# Patient Record
Sex: Female | Born: 1985 | Race: Black or African American | Hispanic: No | Marital: Single | Smoking: Former smoker
Health system: Southern US, Community
[De-identification: ages and names within clinical notes are randomized; demographics above are authoritative.]

## PROBLEM LIST (undated history)

## (undated) DIAGNOSIS — G8929 Other chronic pain: Secondary | ICD-10-CM

## (undated) DIAGNOSIS — F419 Anxiety disorder, unspecified: Secondary | ICD-10-CM

## (undated) DIAGNOSIS — I10 Essential (primary) hypertension: Secondary | ICD-10-CM

## (undated) DIAGNOSIS — M549 Dorsalgia, unspecified: Secondary | ICD-10-CM

## (undated) DIAGNOSIS — K219 Gastro-esophageal reflux disease without esophagitis: Secondary | ICD-10-CM

## (undated) DIAGNOSIS — F329 Major depressive disorder, single episode, unspecified: Secondary | ICD-10-CM

## (undated) DIAGNOSIS — R87619 Unspecified abnormal cytological findings in specimens from cervix uteri: Secondary | ICD-10-CM

## (undated) DIAGNOSIS — F32A Depression, unspecified: Secondary | ICD-10-CM

## (undated) HISTORY — PX: TONSILLECTOMY: SUR1361

## (undated) HISTORY — DX: Unspecified abnormal cytological findings in specimens from cervix uteri: R87.619

## (undated) HISTORY — PX: TUBAL LIGATION: SHX77

---
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
## 2006-02-05 ENCOUNTER — Emergency Department (HOSPITAL_COMMUNITY): Admission: EM | Admit: 2006-02-05 | Discharge: 2006-02-05 | Payer: Self-pay | Admitting: Emergency Medicine

## 2006-05-19 ENCOUNTER — Emergency Department (HOSPITAL_COMMUNITY): Admission: EM | Admit: 2006-05-19 | Discharge: 2006-05-19 | Payer: Self-pay | Admitting: Neurology

## 2006-07-05 ENCOUNTER — Emergency Department (HOSPITAL_COMMUNITY): Admission: EM | Admit: 2006-07-05 | Discharge: 2006-07-05 | Payer: Self-pay | Admitting: Emergency Medicine

## 2006-11-24 ENCOUNTER — Emergency Department (HOSPITAL_COMMUNITY): Admission: EM | Admit: 2006-11-24 | Discharge: 2006-11-24 | Payer: Self-pay | Admitting: Emergency Medicine

## 2007-02-13 ENCOUNTER — Emergency Department (HOSPITAL_COMMUNITY): Admission: EM | Admit: 2007-02-13 | Discharge: 2007-02-13 | Payer: Self-pay | Admitting: *Deleted

## 2007-04-21 ENCOUNTER — Emergency Department (HOSPITAL_COMMUNITY): Admission: EM | Admit: 2007-04-21 | Discharge: 2007-04-21 | Payer: Self-pay | Admitting: Emergency Medicine

## 2007-06-30 ENCOUNTER — Emergency Department (HOSPITAL_COMMUNITY): Admission: EM | Admit: 2007-06-30 | Discharge: 2007-06-30 | Payer: Self-pay | Admitting: Emergency Medicine

## 2007-07-14 ENCOUNTER — Emergency Department (HOSPITAL_COMMUNITY): Admission: EM | Admit: 2007-07-14 | Discharge: 2007-07-14 | Payer: Self-pay | Admitting: Emergency Medicine

## 2007-11-19 ENCOUNTER — Emergency Department (HOSPITAL_COMMUNITY): Admission: EM | Admit: 2007-11-19 | Discharge: 2007-11-19 | Payer: Self-pay | Admitting: Emergency Medicine

## 2008-01-21 ENCOUNTER — Emergency Department (HOSPITAL_COMMUNITY): Admission: EM | Admit: 2008-01-21 | Discharge: 2008-01-21 | Payer: Self-pay | Admitting: Emergency Medicine

## 2008-05-29 ENCOUNTER — Emergency Department (HOSPITAL_COMMUNITY): Admission: EM | Admit: 2008-05-29 | Discharge: 2008-05-29 | Payer: Self-pay | Admitting: Emergency Medicine

## 2008-09-15 ENCOUNTER — Ambulatory Visit (HOSPITAL_COMMUNITY): Admission: RE | Admit: 2008-09-15 | Discharge: 2008-09-15 | Payer: Self-pay | Admitting: Orthopedic Surgery

## 2008-12-23 ENCOUNTER — Emergency Department (HOSPITAL_COMMUNITY): Admission: EM | Admit: 2008-12-23 | Discharge: 2008-12-23 | Payer: Self-pay | Admitting: Emergency Medicine

## 2009-02-23 ENCOUNTER — Ambulatory Visit (HOSPITAL_COMMUNITY): Admission: RE | Admit: 2009-02-23 | Discharge: 2009-02-23 | Payer: Self-pay | Admitting: Pediatrics

## 2009-03-19 ENCOUNTER — Emergency Department (HOSPITAL_COMMUNITY): Admission: EM | Admit: 2009-03-19 | Discharge: 2009-03-19 | Payer: Self-pay | Admitting: Family Medicine

## 2009-06-16 ENCOUNTER — Emergency Department (HOSPITAL_COMMUNITY): Admission: EM | Admit: 2009-06-16 | Discharge: 2009-06-16 | Payer: Self-pay | Admitting: Emergency Medicine

## 2009-09-22 ENCOUNTER — Emergency Department (HOSPITAL_COMMUNITY): Admission: EM | Admit: 2009-09-22 | Discharge: 2009-09-22 | Payer: Self-pay | Admitting: Emergency Medicine

## 2010-03-02 ENCOUNTER — Emergency Department (HOSPITAL_COMMUNITY): Admission: EM | Admit: 2010-03-02 | Discharge: 2010-03-03 | Payer: Self-pay | Admitting: Emergency Medicine

## 2010-04-21 ENCOUNTER — Emergency Department (HOSPITAL_COMMUNITY): Admission: EM | Admit: 2010-04-21 | Discharge: 2010-04-21 | Payer: Self-pay | Admitting: Emergency Medicine

## 2010-07-13 ENCOUNTER — Emergency Department (HOSPITAL_COMMUNITY): Admission: EM | Admit: 2010-07-13 | Discharge: 2010-07-13 | Payer: Self-pay | Admitting: Emergency Medicine

## 2010-09-15 LAB — CYTOLOGY - PAP: Pap Smear: NEGATIVE

## 2010-09-16 ENCOUNTER — Emergency Department (HOSPITAL_COMMUNITY)
Admission: EM | Admit: 2010-09-16 | Discharge: 2010-09-16 | Payer: Self-pay | Source: Home / Self Care | Admitting: Emergency Medicine

## 2010-10-27 ENCOUNTER — Other Ambulatory Visit: Payer: Self-pay | Admitting: Obstetrics & Gynecology

## 2010-10-27 DIAGNOSIS — Q899 Congenital malformation, unspecified: Secondary | ICD-10-CM

## 2010-11-03 ENCOUNTER — Ambulatory Visit (HOSPITAL_COMMUNITY)
Admission: RE | Admit: 2010-11-03 | Discharge: 2010-11-03 | Disposition: A | Discharge status: Home / Self Care | Payer: Medicaid Other | Source: Ambulatory Visit | Attending: Obstetrics & Gynecology | Admitting: Obstetrics & Gynecology

## 2010-11-03 ENCOUNTER — Other Ambulatory Visit (HOSPITAL_COMMUNITY): Payer: Self-pay | Admitting: Maternal and Fetal Medicine

## 2010-11-03 DIAGNOSIS — O358XX0 Maternal care for other (suspected) fetal abnormality and damage, not applicable or unspecified: Secondary | ICD-10-CM

## 2010-11-03 DIAGNOSIS — Q899 Congenital malformation, unspecified: Secondary | ICD-10-CM

## 2010-11-03 DIAGNOSIS — Z1389 Encounter for screening for other disorder: Secondary | ICD-10-CM | POA: Insufficient documentation

## 2010-11-03 DIAGNOSIS — Z363 Encounter for antenatal screening for malformations: Secondary | ICD-10-CM | POA: Insufficient documentation

## 2010-12-06 ENCOUNTER — Ambulatory Visit (HOSPITAL_COMMUNITY)
Admission: RE | Admit: 2010-12-06 | Discharge: 2010-12-06 | Disposition: A | Discharge status: Home / Self Care | Payer: Medicaid Other | Source: Ambulatory Visit | Attending: Obstetrics & Gynecology | Admitting: Obstetrics & Gynecology

## 2010-12-06 ENCOUNTER — Ambulatory Visit (HOSPITAL_COMMUNITY): Payer: Medicaid Other

## 2010-12-06 DIAGNOSIS — O358XX0 Maternal care for other (suspected) fetal abnormality and damage, not applicable or unspecified: Secondary | ICD-10-CM | POA: Insufficient documentation

## 2010-12-06 DIAGNOSIS — Z3689 Encounter for other specified antenatal screening: Secondary | ICD-10-CM | POA: Insufficient documentation

## 2010-12-12 LAB — URINALYSIS, ROUTINE W REFLEX MICROSCOPIC
Protein, ur: NEGATIVE mg/dL
pH: 6 (ref 5.0–8.0)

## 2010-12-12 LAB — GC/CHLAMYDIA PROBE AMP, GENITAL: Chlamydia, DNA Probe: NEGATIVE

## 2010-12-12 LAB — WET PREP, GENITAL: Trich, Wet Prep: NONE SEEN

## 2010-12-12 LAB — URINE MICROSCOPIC-ADD ON

## 2010-12-14 LAB — URINE MICROSCOPIC-ADD ON

## 2010-12-14 LAB — URINALYSIS, ROUTINE W REFLEX MICROSCOPIC
Bilirubin Urine: NEGATIVE
Hgb urine dipstick: NEGATIVE
Ketones, ur: NEGATIVE mg/dL
Nitrite: POSITIVE — AB
Protein, ur: NEGATIVE mg/dL
Specific Gravity, Urine: 1.025 (ref 1.005–1.030)
Urobilinogen, UA: 1 mg/dL (ref 0.0–1.0)
pH: 6.5 (ref 5.0–8.0)

## 2010-12-14 LAB — CBC
Hemoglobin: 14 g/dL (ref 12.0–15.0)
MCH: 30.9 pg (ref 26.0–34.0)
MCV: 90.2 fL (ref 78.0–100.0)

## 2010-12-14 LAB — BASIC METABOLIC PANEL
BUN: 6 mg/dL (ref 6–23)
Calcium: 9.1 mg/dL (ref 8.4–10.5)
Chloride: 101 mEq/L (ref 96–112)
GFR calc non Af Amer: >60 mL/min (ref >60)
Glucose, Bld: 78 mg/dL (ref 70–99)
Potassium: 3.8 mEq/L (ref 3.5–5.1)

## 2010-12-14 LAB — DIFFERENTIAL
Basophils Relative: 0 % (ref 0–1)
Eosinophils Relative: 0 % (ref 0–5)
Lymphs Abs: 2.7 10*3/uL (ref 0.7–4.0)
Monocytes Absolute: 0.8 10*3/uL (ref 0.1–1.0)
Neutro Abs: 7.3 10*3/uL (ref 1.7–7.7)

## 2010-12-14 LAB — HCG, QUANTITATIVE, PREGNANCY: hCG, Beta Chain, Quant, S: 89772 m[IU]/mL — ABNORMAL HIGH (ref <5)

## 2010-12-14 LAB — ABO/RH: ABO/RH(D): B POS

## 2010-12-14 LAB — URINE CULTURE
Colony Count: >=100000
Culture  Setup Time: 201110130156

## 2010-12-19 LAB — RAPID STREP SCREEN (MED CTR MEBANE ONLY): Streptococcus, Group A Screen (Direct): NEGATIVE

## 2011-01-06 LAB — CBC
MCHC: 35.4 g/dL (ref 30.0–36.0)
MCV: 88.3 fL (ref 78.0–100.0)
Platelets: 355 10*3/uL (ref 150–400)
RBC: 4.55 MIL/uL (ref 3.87–5.11)

## 2011-01-06 LAB — URINALYSIS, ROUTINE W REFLEX MICROSCOPIC
Nitrite: NEGATIVE
Specific Gravity, Urine: 1.02 (ref 1.005–1.030)
pH: 7 (ref 5.0–8.0)

## 2011-01-06 LAB — WET PREP, GENITAL
Trich, Wet Prep: NONE SEEN
Yeast Wet Prep HPF POC: NONE SEEN

## 2011-01-06 LAB — PREGNANCY, URINE: Preg Test, Ur: NEGATIVE

## 2011-01-06 LAB — DIFFERENTIAL
Basophils Relative: 1 % (ref 0–1)
Eosinophils Absolute: 0.1 10*3/uL (ref 0.0–0.7)
Monocytes Relative: 8 % (ref 3–12)
Neutrophils Relative %: 52 % (ref 43–77)

## 2011-01-06 LAB — BASIC METABOLIC PANEL
BUN: 12 mg/dL (ref 6–23)
CO2: 24 mEq/L (ref 19–32)
Chloride: 104 mEq/L (ref 96–112)
Creatinine, Ser: 0.75 mg/dL (ref 0.4–1.2)
Glucose, Bld: 95 mg/dL (ref 70–99)

## 2011-01-06 LAB — RAPID STREP SCREEN (MED CTR MEBANE ONLY): Streptococcus, Group A Screen (Direct): NEGATIVE

## 2011-02-17 NOTE — Op Note (Signed)
   NAME:  Laura Lopez, Laura Lopez                     ACCOUNT NO.:  [id number]   MEDICAL RECORD NO.:  [id number]                   PATIENT TYPE:  INP   LOCATION:  LDR2                                 FACILITY:  [facility]   PHYSICIAN:  Tilda Burrow, M.D.              DATE OF BIRTH:  05-Sep-1986   DATE OF PROCEDURE:  DATE OF DISCHARGE:                                 OPERATIVE REPORT   LABOR COURSE:  About an hour after amniotomy, the patient was noted to be  fully dilated with an irresistible urge to push.  After a 12-minute second  stage, she delivered a viable female infant at 0930.  Mouth and nose were  suctioned on the perineum, and the baby was born without difficulty.  Apgars  were 9 and 9, weight 7 pounds 5.6 ounces.  Pitocin, 20 units, diluted in  1000 cc of lactated Ringer's was then infused rapidly IV.  The placenta  separated spontaneously and delivered via maternal pushing effort and  controlled cord traction at 0934.  It was inspected and appeared to be  intact with a three-vessel cord.  The vagina was then inspected, and no  lacerations were found.  Estimated blood loss 350 cc.     Zenovia Jordan, P.A.                      Tilda Burrow, M.D.    RRK/MEDQ  D:  02/06/2003  T:  02/06/2003  Job:  [id number]   cc:   [facility]

## 2011-02-17 NOTE — Discharge Summary (Signed)
[facility]  Patient:    Laura Lopez, Laura Lopez Visit Number: [id number] MRN: [id number]          Service Type: MED Location: 3A A327 01 Attending Physician:  Tilda Burrow Dictated by:   Duane Lope, M.D. Admit Date:  02/10/2002 Discharge Date: 02/12/2002                             Discharge Summary  DISCHARGE DIAGNOSES: 1. Probable right pyelonephritis. 2. No evidence of renal calculus.  PROCEDURES: 1. Feb 10, 2002, admission by Zerita Boers, N.M.. 2. Feb 11, 2002, daily care by Duane Lope, M.D. 3. Feb 12, 2002, discharge care by Duane Lope, M.D.  NOTE:  Please refer to the transcribed history and physical for details of admission to the hospital.  HOSPITAL COURSE:  The patient was admitted with probable pyelonephritis. She has been on Ancef IV.  She began to feel better pretty quickly.  Her white count was 12,500 with a left shift.  Her hemoglobulin and hematocrit was fine. She had an ultrasound on Feb 12, 2002, which was normal.  She had a small ovarian cyst on the left but no renal calculus and no other abnormalities.  DISPOSITION: She was discharged to home on the evening on hospital day #3.  DISCHARGE MEDICATIONS: 1. Levaquin 500 q.d. for 10 days 2. Darvocet as needed for pain.  FOLLOWUP:  Follow up in the office in 1 week.  CONDITION:  She was maintaining p.o. well, pain was well controlled, and her CVA tenderness was just about resolved.  She was given instruction precautions for return prior to that time. Dictated by:   Duane Lope, M.D. Attending Physician:  Tilda Burrow DD:  02/12/02 TD:  02/15/02 Job: [id number]

## 2011-02-17 NOTE — Consult Note (Signed)
NAME:  Laura Lopez, ERICSSON NO.:  [id number]   MEDICAL RECORD NO.:  [id number]          PATIENT TYPE:  EMS   LOCATION:  ED                            FACILITY:  [facility]   PHYSICIAN:  Tilda Burrow, M.D. DATE OF BIRTH:  10/06/1985   DATE OF CONSULTATION:  DATE OF DISCHARGE:                                   CONSULTATION   CONSULTING PHYSICIAN:  Tilda Burrow, M.D.   CHIEF COMPLAINT:  Right lower quadrant pain, nausea, weak, vomiting,  pregnancy five weeks' gestation.   HISTORY OF PRESENT ILLNESS:  This 25 year old female, gravida 3, para 2,  with recent diagnosis of pregnancy, seen by Dr. Colon Branch in the emergency room  for evaluation of right lower quadrant pain.  She has had a recent positive  pregnancy test in our office.  She is not established prenatal care but has  an appointment for Feb 15, 2006 at our office.  Evaluation here in the  emergency room includes a quantitative HCG of 2,000 and a pelvic ultrasound  which shows an anteflexed uterus with a small fluid pocket in the uterus  without discernible fetal pole or yolk sack yet available.  Evelena Leyden is  sufficiently large to possibly represent intrauterine pregnancy.  Cul-de-sac  does not show any bleeding.  The right adnexa shows normal ovary.  The  patient feels better with pain having improved while being in observation  and at rest.  She is therefore scheduled for followup as scheduled, on Feb 15, 2006, at which time a followup ultrasound will be performed.  Any  deterioration of symptoms, the patient knows to contact our office for  advancement of her appointment.   ADDENDUM:  We will wait on prenatal _I profile labs__  until followup visit.      Tilda Burrow, M.D.  Electronically Signed     JVF/MEDQ  D:  02/05/2006  T:  02/06/2006  Job:  [id number]   cc:   Tilda Burrow, M.D.  Fax: [phone]

## 2011-02-17 NOTE — H&P (Signed)
   NAME:  Laura Lopez, Laura Lopez                     ACCOUNT NO.:  [id number]   MEDICAL RECORD NO.:  [id number]                   PATIENT TYPE:  INP   LOCATION:  LDR2                                 FACILITY:  [facility]   PHYSICIAN:  Tilda Burrow, M.D.              DATE OF BIRTH:  Dec 04, 1985   DATE OF ADMISSION:  02/06/2003  DATE OF DISCHARGE:                                HISTORY & PHYSICAL   CHIEF COMPLAINT:  Contractions beginning about 12 a.m.   HISTORY OF PRESENT ILLNESS:  Laura Lopez is a 25 year old gravida 2, para 1  with an EDC of 02/17/2003 based on sure last menstrual period and close to  correlating first and second trimester ultrasounds placing her at 38-1/[redacted]  weeks gestational age. Prenatal care began early in the first trimester and  she has had regular visits throughout.   PRENATAL LABS:  Blood type B positive, rubella immune.  HBSAG, HIV, and RPR  are all negative.  She had a positive Chlamydia in the first trimester which  was treated, with a repeat negative culture.  GBS is positive.  A 1-hour GTT  was normal at 80.  MSAFP was also normal.  Sickle cell was negative.  Her  weight gain has been approximately 16 pounds with appropriate fundal height  growth.  Blood pressures 100s-120s/60s-80s.   PAST MEDICAL HISTORY:  Noncontributory.   PAST SURGICAL HISTORY:  Noncontributory. No history of blood transfusions.   SOCIAL HISTORY:  She is single.  Denies smoking, alcohol, or drug use.   FAMILY HISTORY:  Positive for muscular dystrophy on her side and 2 nephews  on the father of the baby's side.   OBSTETRICAL HISTORY:  Positive for a vaginal delivery in 2002 of a 6 pound  10 ounce female without complications.   PHYSICAL EXAMINATION:  HEENT:  Within normal limits.  HEART:  Regular rate and rhythm.  LUNGS:  Clear.  ABDOMEN:  Soft, nontender.  Was having regular, mild-to-moderate  contractions every 2-3 minutes.  PELVIC:  Cervical exam upon admission was 4-5, 100%, minus  1.  Fetal heart  rate 140s-150s with average long term variability, access present,  occasional mild variable decelerations.  Cervical exam, now, is 5-6 cm,  100%, minus 1 station, artifical rupture of membranes reveals clear fluid.  EXTREMITIES:  Legs are negative.    IMPRESSION:  Intrauterine pregnancy at 38-1/2 weeks, active labor.  Group B  Strep carrier.   PLAN:  Continue with GBS prophylaxis.  Expect vaginal delivery.  The patient  wants IV medications.     Jacklyn Shell, C.N.M.          Tilda Burrow, M.D.    FC/MEDQ  D:  02/06/2003  T:  02/06/2003  Job:  [id number]   cc:   [facility]

## 2011-02-17 NOTE — Discharge Summary (Signed)
NAME:  Laura Lopez, Laura Lopez                     ACCOUNT NO.:  [id number]   MEDICAL RECORD NO.:  [id number]                   PATIENT TYPE:  OIB   LOCATION:  A415                                 FACILITY:  [facility]   PHYSICIAN:  Langley Gauss, M.D.                DATE OF BIRTH:  01-23-1986   DATE OF ADMISSION:  11/16/2002  DATE OF DISCHARGE:                                 DISCHARGE SUMMARY   HISTORY OF PRESENT ILLNESS:  This is a 25 year old, G2, P53, at 72 and 6/7ths  weeks gestation followed by [facility] who presents to [facility] with a chief complaint of lower abdominal pain, and she felt a gush  of fluid at 1730.  The patient's history is that she was watching a movie,  and when she got up she felt very wet in her undergarments and had some  fluid running down her leg.  She thereafter went to the bathroom and voided,  emptying her bladder, and upon returning from the bathroom she again had  some moistness in her vaginal pelvic area.  She states that she continued to  feel moist and contracted labor and delivery, at which time she was advised  to present.  She again continued feeling moist in the vaginal pelvic area,  but denied any additional leakage of fluid down her leg.   Pertinently, she has denied any change in her vaginal discharge.  No recent  change as far as itching, burning, odor, or increased discharge.  She denies  any GI symptoms.  She has had good fetal movement.  The pain which she  describes is a tightening in the back and pelvic area; however, the  tightening lasted for five minutes duration and is occurring 2-3 times in  each 30 minute time period and has continued all day today.   The patient's entire prenatal records were reviewed as available on labor  and delivery.  The patient's prenatal record is pertinent for a history of  Chlamydia 07/08/02.  The patient was treated at that time, and on subsequent  follow up a test of cure was noted to be  negative for Chlamydia.  Test of  cure done 08/12/02.  The patient is noted to have a normal AFP triple  screen.  A urine drug screen is negative.  She has serial ultrasounds which  document her EDC of 02/17/03.   FAMILY HISTORY:  Pertinent for a strong family history of muscular dystrophy  on the father of the baby's side, and on the patient's side she did decline  genetic amniocentesis.  She did have genetic counseling offered, and an  appointment was scheduled for the patient.  It was unclear whether she  attended this patient counseling session.   SOCIAL HISTORY:  The patient, of course, is single.  She lives with a woman  named Mattie.  She is a nonsmoker.  No drug use.  ALLERGIES:  No known drug allergies.   CURRENT MEDICATIONS:  Prenatal vitamins only.   PAST MEDICAL HISTORY:  Otherwise negative with no other hospitalizations  other than for childbirth.   PAST OB HISTORY:  2002 at [redacted] weeks gestation, 6 pound, 10 ounce female  infant delivered by Dr. Christin Bach.  The patient reports no problems  during that prenatal course, and no problems at time of delivery.   PHYSICAL EXAMINATION:  GENERAL:  Very immature adolescent female.  The  patient is alert and oriented.  VITAL SIGNS:  Temperature 99.2, pulse 112, respiratory rate 20, blood  pressure 108/55.  HEENT:  Reveals neck to be supple.  Thyroid is nonpalpable.  Mucous  membranes are moist.  LUNGS:  Clear.  CARDIOVASCULAR:  Regular rate and rhythm.  ABDOMEN:  Soft and nontender.  Fundal height is 26 cm.  She is vertex  presentation by Leopold's maneuvers.  No uterine or abdominal tenderness  elicited.  EXTREMITIES:  Noted to be normal.  PELVIC:  Speculum examination performed.  There is noted to be no evidence  of any leakage of fluid or vaginal bleeding externally.  The cervix is  visualized and noted to be visually closed.  There is a bubbly frothy-  appearing discharge present with some pooling in the posterior  vaginal  fornix.  No odor is identified.  GC and Chlamydia cultures and a wet prep  are not performed due to the inherent difficulties of technically performing  these tests on labor and delivery; however, a dry slide was obtained, which  upon drying is negative for ferns.  A single sterile digital exam is  performed which reveals the cervix to be closed, 4 cm long.  The presenting  part is non-engaged.   A sonogram was performed and interpreted by Dr. Langley Gauss which reveals  a single intrauterine pregnancy, vertex presentation.  Good fetal movement  is identified.  Normal fetal tone.  BPD and femur length were obtained,  which are consistent with 27 to [redacted] weeks gestation.  Fetal cardiac activity  is identified.  Amniotic fluid index was performed which reveals an AFI  totaling 18.  External fetal monitor reveals a fetal heart rate baseline of  155.  There are no fetal heart rate decelerations noted.  There was no urine  activity identified on the external toco.   ASSESSMENT:  A 27 week intrauterine pregnancy with leukorrhea.  No evidence  of rupture of membranes.  The patient is noted to be high risk for sexually  transmitted diseases in this adolescent group, particular with a history of  positive Chlamydia during this pregnancy.  Thus she will require testing on  an outpatient basis with performance of a wet prep to look for any other  sexually transmitted diseases.                                               Langley Gauss, M.D.    DC/MEDQ  D:  11/16/2002  T:  11/16/2002  Job:  [id number]   cc:   [facility]

## 2011-02-24 ENCOUNTER — Inpatient Hospital Stay (HOSPITAL_COMMUNITY)
Admission: AD | Admit: 2011-02-24 | Discharge: 2011-02-26 | DRG: 775 | Disposition: A | Discharge status: Home / Self Care | Payer: Medicaid Other | Source: Ambulatory Visit | Attending: Obstetrics & Gynecology | Admitting: Obstetrics & Gynecology

## 2011-02-24 ENCOUNTER — Emergency Department (HOSPITAL_COMMUNITY)
Admission: EM | Admit: 2011-02-24 | Discharge: 2011-02-24 | Disposition: A | Discharge status: Home / Self Care | Payer: Medicaid Other | Source: Home / Self Care | Attending: Emergency Medicine | Admitting: Emergency Medicine

## 2011-02-24 LAB — DIFFERENTIAL
Basophils Absolute: 0 10*3/uL (ref 0.0–0.1)
Basophils Relative: 0 % (ref 0–1)
Eosinophils Relative: 1 % (ref 0–5)
Monocytes Absolute: 0.7 10*3/uL (ref 0.1–1.0)
Neutro Abs: 4.3 10*3/uL (ref 1.7–7.7)

## 2011-02-24 LAB — BASIC METABOLIC PANEL
Calcium: 9.9 mg/dL (ref 8.4–10.5)
GFR calc Af Amer: >60 mL/min (ref >60)
GFR calc non Af Amer: >60 mL/min (ref >60)
Sodium: 137 mEq/L (ref 135–145)

## 2011-02-24 LAB — CBC
Hemoglobin: 12.5 g/dL (ref 12.0–15.0)
Hemoglobin: 12.1 g/dL (ref 12.0–15.0)
MCHC: 34.6 g/dL (ref 30.0–36.0)
Platelets: 280 10*3/uL (ref 150–400)
RBC: 4.11 MIL/uL (ref 3.87–5.11)
RDW: 13.6 % (ref 11.5–15.5)
WBC: 10.7 10*3/uL — ABNORMAL HIGH (ref 4.0–10.5)

## 2011-02-25 LAB — RPR: RPR Ser Ql: NONREACTIVE

## 2011-02-26 LAB — CBC
HCT: 31.5 % — ABNORMAL LOW (ref 36.0–46.0)
Hemoglobin: 10.9 g/dL — ABNORMAL LOW (ref 12.0–15.0)
WBC: 10.7 10*3/uL — ABNORMAL HIGH (ref 4.0–10.5)

## 2011-02-28 NOTE — Op Note (Signed)
  NAMETEMPEST, FRANKLAND           ACCOUNT NO.:  [id number]  MEDICAL RECORD NO.:  [id number]           PATIENT TYPE:  I  LOCATION:  9143                          FACILITY:  [facility]  PHYSICIAN:  Tilda Burrow, M.D. DATE OF BIRTH:  1986-09-10  DATE OF PROCEDURE: DATE OF DISCHARGE:                              OPERATIVE REPORT   PREOPERATIVE DIAGNOSIS:  Desire for postpartum permanent sterilization.  POSTOPERATIVE DIAGNOSIS:  Desire for postpartum permanent sterilization.  PROCEDURE:  Postpartum sterilization by Filshie clips.  SURGEON:  Tilda Burrow, MD  ASSISTANT:  Selena Batten.  ANESTHESIA:  Epidural.  COMPLICATIONS:  None.  FINDINGS:  Normal tubes bilaterally, visualized fimbriated end.  INDICATIONS:  A 25 year old female, multipara delivering yesterday desiring permanent sterilization.  DETAILS OF PROCEDURE:  The patient was taken to the operating room. After consents obtained and confirmed by all involved parties, time-out completed.  Infraumbilical semicircular 2-cm incision was made with sharp dissection down to the peritoneum, which was bluntly entered.  The attention was directed first to the right tube, which was identified to its fimbriated end with a Filshie clip placed in the midportion of the tube with good placement confirmed visually.  The opposite tube was treated similarly.  The fascial layer was closed with running 0 Vicryl. The subcuticular 4-0 Vicryl closure of the skin completed the procedure. The patient tolerated procedure well, went to recovery room in good condition.  Sponge and needle counts correct.     Tilda Burrow, M.D.     JVF/MEDQ  D:  02/25/2011  T:  02/25/2011  Job:  [id number]  Electronically Signed by Christin Bach M.D. on 02/28/2011 01:47:09 AM

## 2011-06-23 LAB — STREP A DNA PROBE

## 2011-06-23 LAB — RAPID STREP SCREEN (MED CTR MEBANE ONLY): Streptococcus, Group A Screen (Direct): NEGATIVE

## 2011-06-27 LAB — CBC
MCHC: 35.2
Platelets: 376
RBC: 4.74
WBC: 10.3

## 2011-06-27 LAB — CK: Total CK: 169

## 2011-06-27 LAB — BASIC METABOLIC PANEL
BUN: 8
CO2: 25
Calcium: 9.4
Creatinine, Ser: 0.78
GFR calc Af Amer: >60

## 2011-06-27 LAB — DIFFERENTIAL
Basophils Absolute: 0.1
Eosinophils Absolute: 0.2
Lymphs Abs: 3.7
Neutrophils Relative %: 52

## 2011-06-27 LAB — D-DIMER, QUANTITATIVE: D-Dimer, Quant: 0.32

## 2011-06-27 LAB — POCT CARDIAC MARKERS: CKMB, poc: <1 — ABNORMAL LOW

## 2011-06-27 LAB — SEDIMENTATION RATE: Sed Rate: 25 — ABNORMAL HIGH

## 2011-07-13 LAB — STREP A DNA PROBE: Group A Strep Probe: NEGATIVE

## 2011-07-17 LAB — URINE CULTURE: Colony Count: >=100000

## 2011-07-17 LAB — URINALYSIS, ROUTINE W REFLEX MICROSCOPIC
Hgb urine dipstick: NEGATIVE
Nitrite: NEGATIVE
Specific Gravity, Urine: 1.02
Urobilinogen, UA: 0.2
pH: 6

## 2011-07-17 LAB — URINE MICROSCOPIC-ADD ON

## 2011-07-28 ENCOUNTER — Emergency Department (HOSPITAL_COMMUNITY)
Admission: EM | Admit: 2011-07-28 | Discharge: 2011-07-28 | Disposition: A | Discharge status: Home / Self Care | Payer: Medicaid Other | Attending: Emergency Medicine | Admitting: Emergency Medicine

## 2011-07-28 DIAGNOSIS — J329 Chronic sinusitis, unspecified: Secondary | ICD-10-CM | POA: Insufficient documentation

## 2011-07-28 MED ORDER — PENICILLIN V POTASSIUM 500 MG PO TABS: 500.0000 mg | ORAL_TABLET | Freq: Three times a day (TID) | ORAL | Status: AC

## 2011-07-28 MED ORDER — FEXOFENADINE-PSEUDOEPHED ER 60-120 MG PO TB12: 1.0000 | ORAL_TABLET | Freq: Two times a day (BID) | ORAL | Status: DC

## 2011-07-28 MED ORDER — HYDROCODONE-ACETAMINOPHEN 5-325 MG PO TABS: ORAL_TABLET | ORAL | Status: DC

## 2011-07-28 NOTE — ED Provider Notes (Signed)
Medical screening examination/treatment/procedure(s) were performed by non-physician practitioner and as supervising physician I was immediately available for consultation/collaboration.   Joya Gaskins, MD 07/28/11 2052

## 2011-07-28 NOTE — ED Provider Notes (Signed)
History     CSN: [id number] Arrival date & time: 07/28/2011  5:26 PM   None     Chief Complaint  Patient presents with  . Otalgia    (Consider location/radiation/quality/duration/timing/severity/associated sxs/prior treatment) Patient is a 25 y.o. female presenting with ear pain. The history is provided by the patient.  Otalgia This is a new problem. The current episode started 12 to 24 hours ago. There is pain in the right ear. The problem occurs constantly. The problem has not changed since onset.There has been no fever. The pain is moderate. Pertinent negatives include no abdominal pain, no neck pain and no cough.    History reviewed. No pertinent past medical history.  Past Surgical History  Procedure Date  . Tubal ligation   . Tonsillectomy     History reviewed. No pertinent family history.  History  Substance Use Topics  . Smoking status: Never Smoker   . Smokeless tobacco: Not on file  . Alcohol Use: Yes     occ    OB History    Grav Para Term Preterm Abortions TAB SAB Ect Mult Living                  Review of Systems  Constitutional: Negative for activity change.       All ROS Neg except as noted in HPI  HENT: Positive for ear pain, congestion and postnasal drip. Negative for nosebleeds and neck pain.   Eyes: Negative for photophobia and discharge.  Respiratory: Negative for cough, shortness of breath and wheezing.   Cardiovascular: Negative for chest pain and palpitations.  Gastrointestinal: Negative for abdominal pain and blood in stool.  Genitourinary: Negative for dysuria, frequency and hematuria.  Musculoskeletal: Negative for back pain and arthralgias.  Skin: Negative.   Neurological: Negative for dizziness, seizures and speech difficulty.  Psychiatric/Behavioral: Negative for hallucinations and confusion.    Allergies  Review of patient's allergies indicates no known allergies.  Home Medications   Current Outpatient Rx  Name Route Sig  Dispense Refill  . GUAIFENESIN-DM 100-10 MG/5ML PO SYRP Oral Take 10 mLs by mouth as needed. For cough     . IBUPROFEN 200 MG PO TABS Oral Take 200 mg by mouth as needed. For pain     . FEXOFENADINE-PSEUDOEPHEDRINE 60-120 MG PO TB12 Oral Take 1 tablet by mouth every 12 (twelve) hours. 20 tablet 0  . HYDROCODONE-ACETAMINOPHEN 5-325 MG PO TABS  1 po q4h prn pain, take with food 12 tablet 0  . PENICILLIN V POTASSIUM 500 MG PO TABS Oral Take 1 tablet (500 mg total) by mouth 3 (three) times daily. 21 tablet 0    BP 136/76  Pulse 85  Temp(Src) 98.5 F (36.9 C) (Oral)  Resp 20  Ht 5\' 6"  (1.676 m)  Wt 191 lb (86.637 kg)  BMI 30.83 kg/m2  SpO2 100%  LMP 07/24/2011  Physical Exam  Nursing note and vitals reviewed. Constitutional: She is oriented to person, place, and time. She appears well-developed and well-nourished.  Non-toxic appearance.  HENT:  Head: Normocephalic.  Right Ear: Tympanic membrane normal.  Left Ear: Tympanic membrane normal.       Rt and Left ear partially obstructed with cerumen. Mild increase redness of the posterior pharynx.  Pain to percussion of the right maxillary sinuses.  Eyes: EOM and lids are normal. Pupils are equal, round, and reactive to light.  Neck: Normal range of motion. Neck supple. Carotid bruit is not present.  Cardiovascular: Normal rate, regular rhythm,  normal heart sounds, intact distal pulses and normal pulses.   Pulmonary/Chest: Breath sounds normal. No respiratory distress.  Abdominal: Soft. Bowel sounds are normal. There is no tenderness. There is no guarding.  Musculoskeletal: Normal range of motion.  Lymphadenopathy:       Head (right side): No submandibular adenopathy present.       Head (left side): No submandibular adenopathy present.    She has no cervical adenopathy.  Neurological: She is alert and oriented to person, place, and time. She has normal strength. No cranial nerve deficit or sensory deficit.  Skin: Skin is warm and dry.    Psychiatric: She has a normal mood and affect. Her speech is normal.    ED Course  Procedures (including critical care time)  Labs Reviewed - No data to display No results found.   1. Sinusitis       MDM  I have reviewed nursing notes, vital signs, and all appropriate lab and imaging results for this patient.        Kathie Dike, [place] 07/28/11 [phone]

## 2011-07-28 NOTE — ED Notes (Signed)
Pt presents with right ear pain and clear drainage that started this AM. Pt states face also is hurting. NAD at this time.

## 2011-09-14 ENCOUNTER — Emergency Department (HOSPITAL_COMMUNITY)
Admission: EM | Admit: 2011-09-14 | Discharge: 2011-09-14 | Disposition: A | Discharge status: Home / Self Care | Payer: Medicaid Other | Attending: Emergency Medicine | Admitting: Emergency Medicine

## 2011-09-14 ENCOUNTER — Encounter (HOSPITAL_COMMUNITY): Payer: Self-pay | Admitting: Emergency Medicine

## 2011-09-14 DIAGNOSIS — M549 Dorsalgia, unspecified: Secondary | ICD-10-CM | POA: Insufficient documentation

## 2011-09-14 DIAGNOSIS — R51 Headache: Secondary | ICD-10-CM | POA: Insufficient documentation

## 2011-09-14 DIAGNOSIS — R109 Unspecified abdominal pain: Secondary | ICD-10-CM | POA: Insufficient documentation

## 2011-09-14 DIAGNOSIS — N390 Urinary tract infection, site not specified: Secondary | ICD-10-CM

## 2011-09-14 DIAGNOSIS — R509 Fever, unspecified: Secondary | ICD-10-CM | POA: Insufficient documentation

## 2011-09-14 DIAGNOSIS — R300 Dysuria: Secondary | ICD-10-CM | POA: Insufficient documentation

## 2011-09-14 DIAGNOSIS — R112 Nausea with vomiting, unspecified: Secondary | ICD-10-CM | POA: Insufficient documentation

## 2011-09-14 DIAGNOSIS — R42 Dizziness and giddiness: Secondary | ICD-10-CM | POA: Insufficient documentation

## 2011-09-14 LAB — URINE MICROSCOPIC-ADD ON

## 2011-09-14 LAB — URINALYSIS, ROUTINE W REFLEX MICROSCOPIC
Bilirubin Urine: NEGATIVE
Glucose, UA: NEGATIVE mg/dL
Hgb urine dipstick: NEGATIVE
Ketones, ur: NEGATIVE mg/dL
Protein, ur: NEGATIVE mg/dL
pH: 6 (ref 5.0–8.0)

## 2011-09-14 MED ORDER — CEFTRIAXONE SODIUM 1 G IJ SOLR
1.0000 g | Freq: Once | INTRAMUSCULAR | Status: AC
  Administered 2011-09-14: 1 g via INTRAVENOUS
  Filled 2011-09-14: qty 10

## 2011-09-14 MED ORDER — SODIUM CHLORIDE 0.9 % IV BOLUS (SEPSIS)
1000.0000 mL | Freq: Once | INTRAVENOUS | Status: AC
  Administered 2011-09-14: 1000 mL via INTRAVENOUS

## 2011-09-14 MED ORDER — ONDANSETRON HCL 4 MG PO TABS: 4.0000 mg | ORAL_TABLET | Freq: Four times a day (QID) | ORAL | Status: AC

## 2011-09-14 MED ORDER — ONDANSETRON HCL 4 MG/2ML IJ SOLN
4.0000 mg | Freq: Once | INTRAMUSCULAR | Status: AC
  Administered 2011-09-14: 4 mg via INTRAVENOUS
  Filled 2011-09-14: qty 2

## 2011-09-14 MED ORDER — NITROFURANTOIN MONOHYD MACRO 100 MG PO CAPS: 100.0000 mg | ORAL_CAPSULE | Freq: Two times a day (BID) | ORAL | Status: AC

## 2011-09-14 NOTE — ED Notes (Signed)
Discharge instructions reviewed with pt, questions answered. Pt verbalized understanding.  

## 2011-09-14 NOTE — ED Notes (Signed)
No adverse reactions to medications, pt reports relief from nausea

## 2011-09-14 NOTE — ED Provider Notes (Signed)
History  Scribed for Donnetta Hutching, MD, the patient was seen in room APA16A/APA16A. This chart was scribed by Candelaria Stagers. The patient's care started at 9:19 PM    CSN: [id number] Arrival date & time: 09/14/2011  8:46 PM   First MD Initiated Contact with Patient 09/14/11 2100      Chief Complaint  Patient presents with  . Fever  . Emesis  . Urinary Tract Infection     The history is provided by the patient.   Laura Lopez is a 25 y.o. female who presents to the Emergency Department complaining of vomiting and fever which began about six hours ago.  Pt states she has experienced headache, chills, dizziness, abdominal pain, and dysuria.  Her LNMP was November 21st and reports she has had tubal ligation.      History reviewed. No pertinent past medical history.  Past Surgical History  Procedure Date  . Tubal ligation   . Tonsillectomy     No family history on file.  History  Substance Use Topics  . Smoking status: Former Games developer  . Smokeless tobacco: Not on file  . Alcohol Use: Yes     occ    OB History    Grav Para Term Preterm Abortions TAB SAB Ect Mult Living                  Review of Systems  Constitutional: Positive for fever and chills.  HENT: Negative for rhinorrhea.   Eyes: Negative for pain.  Respiratory: Negative for cough and shortness of breath.   Cardiovascular: Negative for chest pain.  Gastrointestinal: Positive for nausea, vomiting and abdominal pain. Negative for diarrhea.  Genitourinary: Positive for dysuria.  Musculoskeletal: Positive for back pain (mild ).  Skin: Negative for rash.  Neurological: Positive for dizziness and headaches. Negative for weakness.    Allergies  Review of patient's allergies indicates no known allergies.  Home Medications   Current Outpatient Rx  Name Route Sig Dispense Refill  . GUAIFENESIN-DM 100-10 MG/5ML PO SYRP Oral Take 10 mLs by mouth as needed. For cough     . IBUPROFEN 200 MG PO TABS Oral  Take 200 mg by mouth as needed. For pain     . FEXOFENADINE-PSEUDOEPHED ER 60-120 MG PO TB12 Oral Take 1 tablet by mouth every 12 (twelve) hours. 20 tablet 0  . HYDROCODONE-ACETAMINOPHEN 5-325 MG PO TABS  1 po q4h prn pain, take with food 12 tablet 0    BP 127/71  Pulse 126  Temp(Src) 100.3 F (37.9 C) (Oral)  Resp 20  Ht 5\' 6"  (1.676 m)  Wt 191 lb (86.637 kg)  BMI 30.83 kg/m2  SpO2 100%  LMP 08/23/2011  Physical Exam  Nursing note and vitals reviewed. Constitutional: She is oriented to person, place, and time. She appears well-developed and well-nourished. No distress.  HENT:  Head: Normocephalic and atraumatic.  Eyes: EOM are normal. Pupils are equal, round, and reactive to light.  Neck: Neck supple. No tracheal deviation present.  Cardiovascular: Normal rate.   Pulmonary/Chest: Effort normal. No respiratory distress.  Abdominal: Soft. She exhibits no distension. There is tenderness ( minimal lower abdominal tenderness of the midline).  Musculoskeletal: Normal range of motion. She exhibits tenderness (minimal flank tenderness of the right side). She exhibits no edema.  Neurological: She is alert and oriented to person, place, and time. No sensory deficit.  Skin: Skin is warm and dry.  Psychiatric: She has a normal mood and affect. Her behavior is normal.  ED Course  Procedures   Suspects flu and UTI, will give fluids and order urinalysis.   DIAGNOSTIC STUDIES: Oxygen Saturation is 100% on room air, normal by my interpretation.    COORDINATION OF CARE:  9:38PM Ordered: Urinalysis, Routine w reflex microscopic ; Pregnancy, urine ; ondansetron (ZOFRAN) injection 4 mg ; sodium chloride 0.9 % bolus 1,000 mL    Labs Reviewed  URINALYSIS, ROUTINE W REFLEX MICROSCOPIC - Abnormal; Notable for the following:    Leukocytes, UA SMALL (*)    All other components within normal limits  URINE MICROSCOPIC-ADD ON - Abnormal; Notable for the following:    Squamous Epithelial / LPF  MANY (*)    Bacteria, UA FEW (*)    All other components within normal limits  PREGNANCY, URINE   No results found.   No diagnosis found.    MDM  Patient complains of nausea, vomiting, dysuria.  Feeling better after IV rehydration.  Rocephin 1 g IV given for urinary tract action.  Discharge home on Macrobid and Zofran   I personally performed the services described in this documentation, which was scribed in my presence. The recorded information has been reviewed and considered.        Donnetta Hutching, MD 09/14/11 [phone]

## 2011-09-14 NOTE — ED Notes (Signed)
Patient c/o fever and vomiting since 1530; states she thinks she might have a UTI also.

## 2011-09-14 NOTE — ED Notes (Signed)
MD at bedside. 

## 2011-10-15 IMAGING — US US OB DETAIL+14 WK
1 series · 18 of 28 positions shown · non-contrast
Comparison: none

[Series 1: us ob detail+14 wk · 18 of 114 slices shown]
[im 1/114]
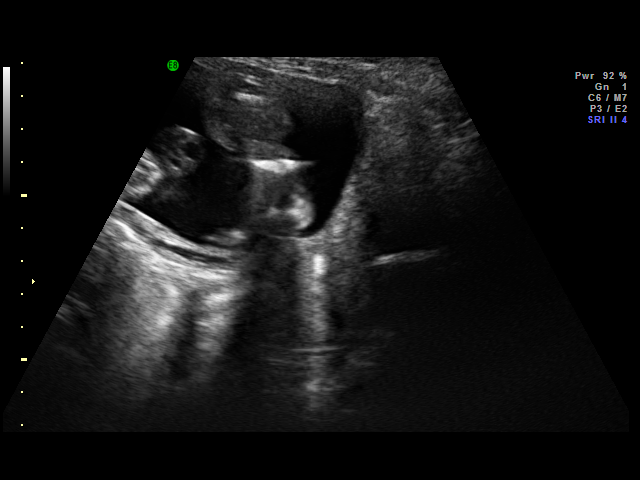
[im 9/114]
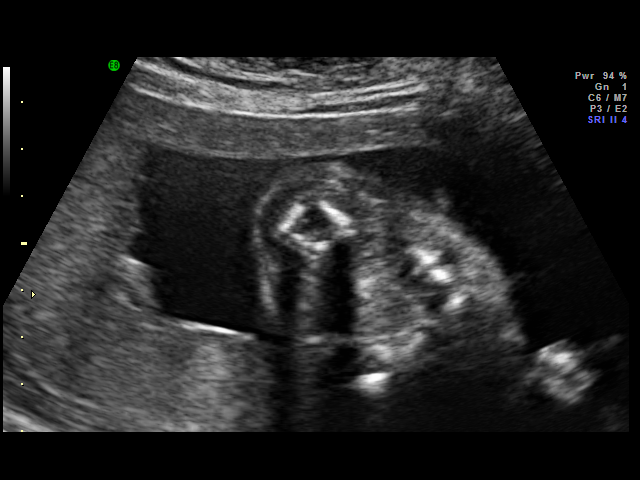
[im 13/114]
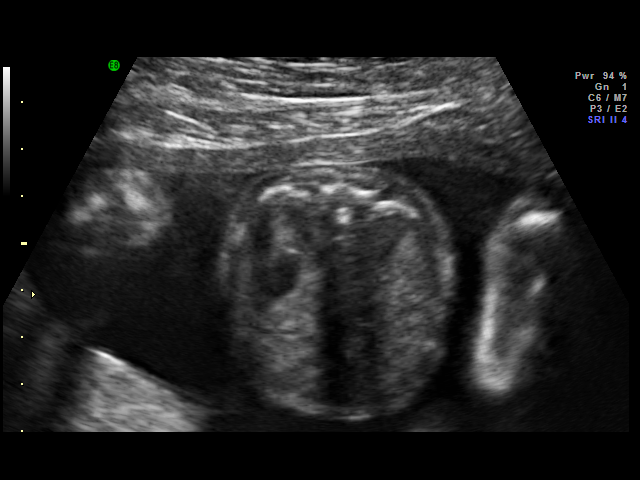
[im 21/114]
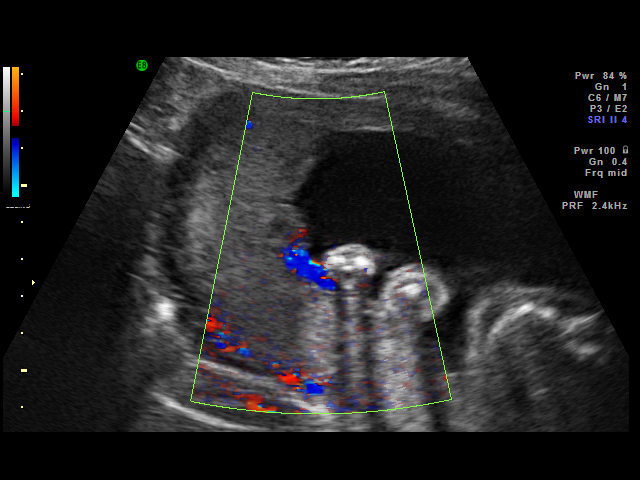
[im 30/114]
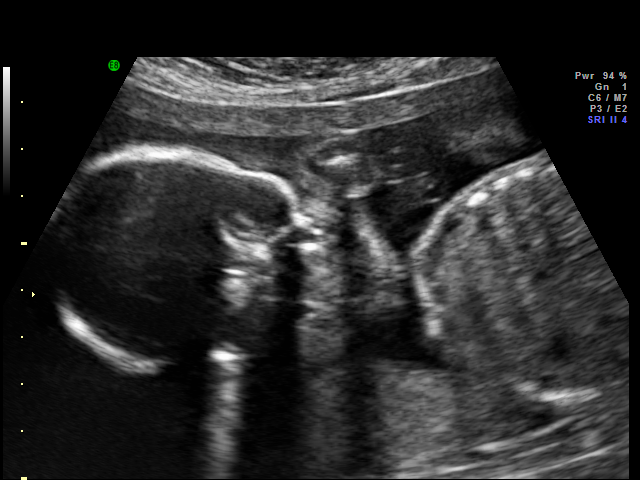
[im 34/114]
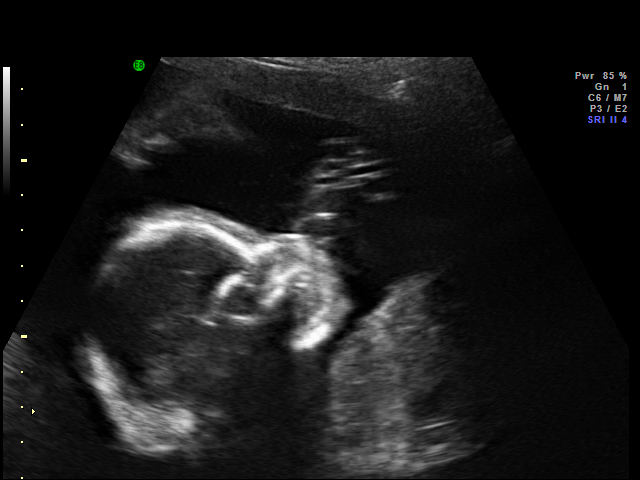
[im 42/114]
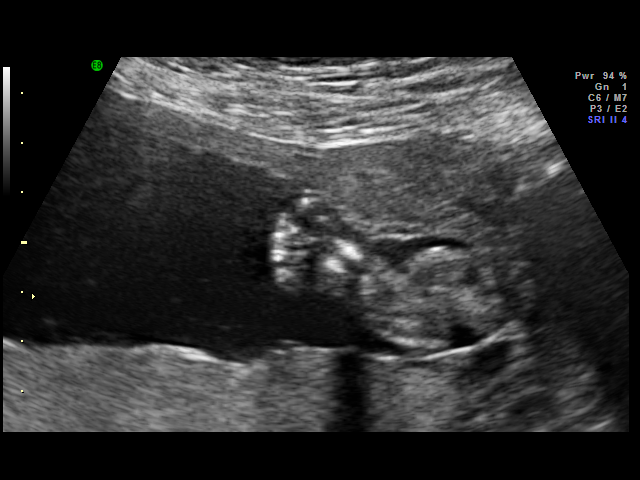
[im 47/114]
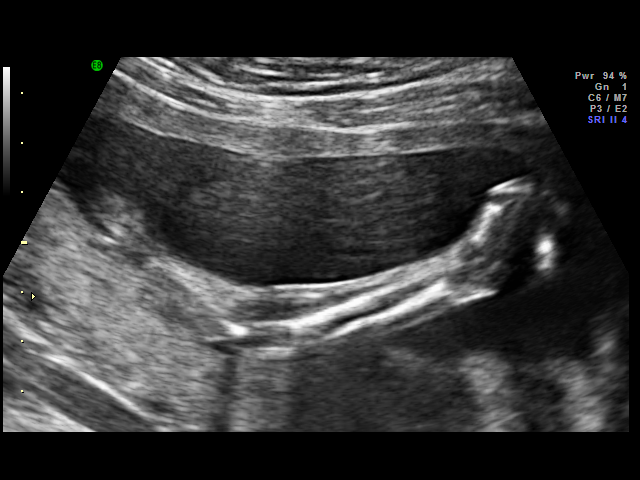
[im 55/114]
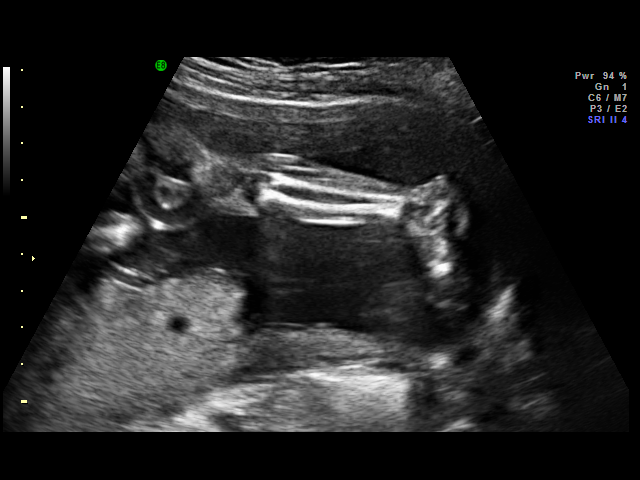
[im 59/114]
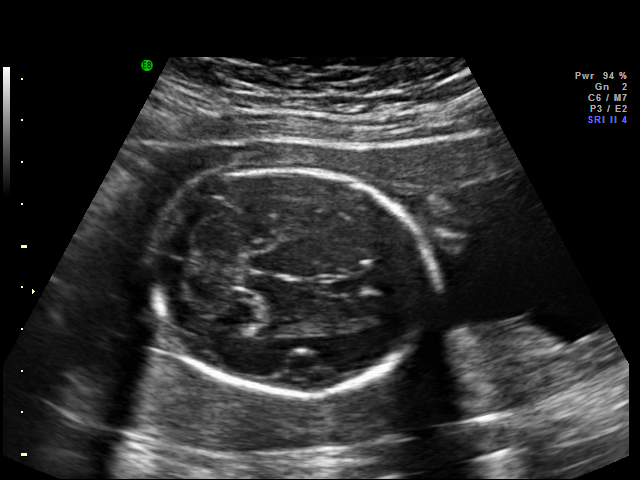
[im 67/114]
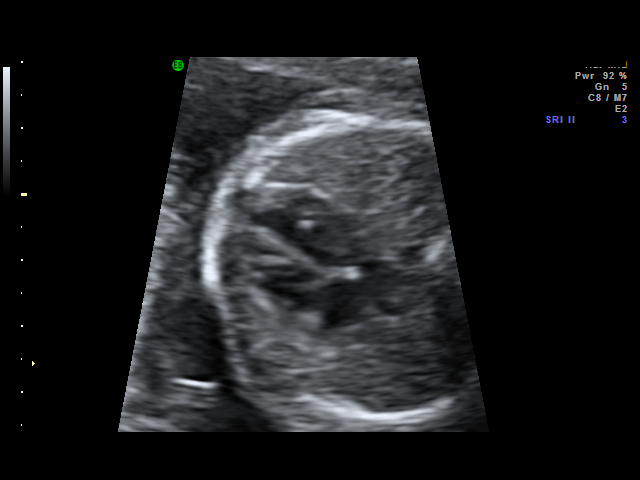
[im 72/114]
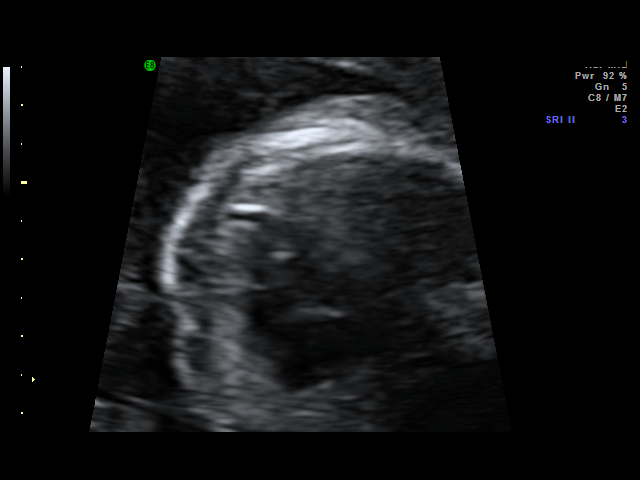
[im 80/114]
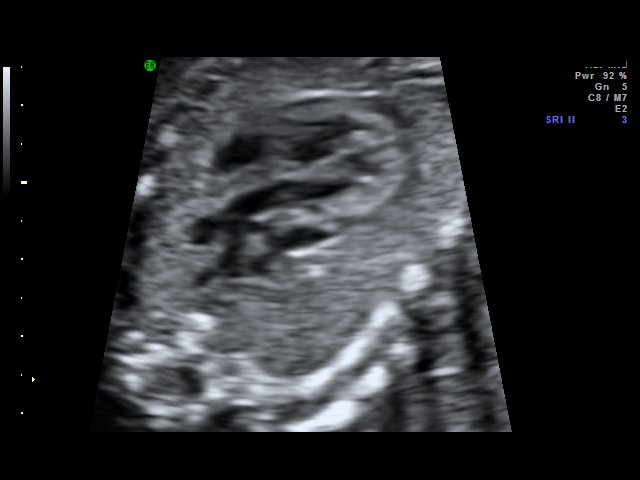
[im 88/114]
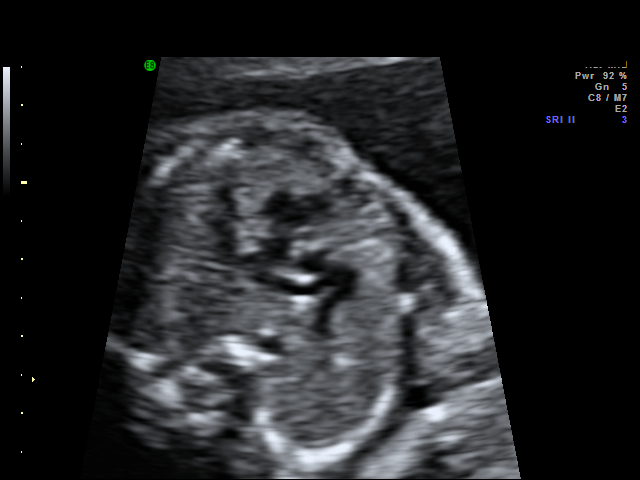
[im 93/114]
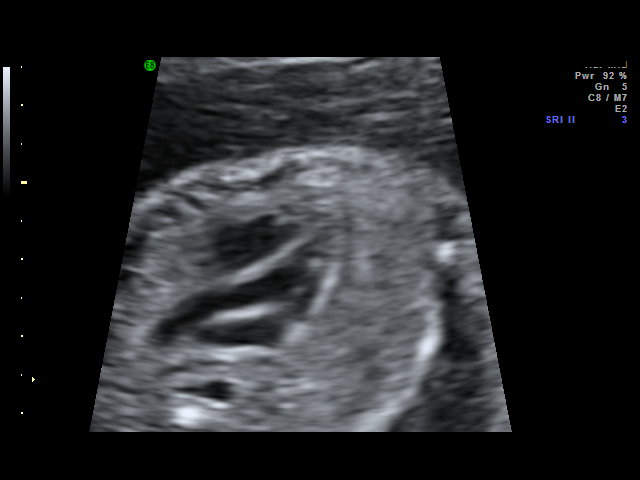
[im 101/114]
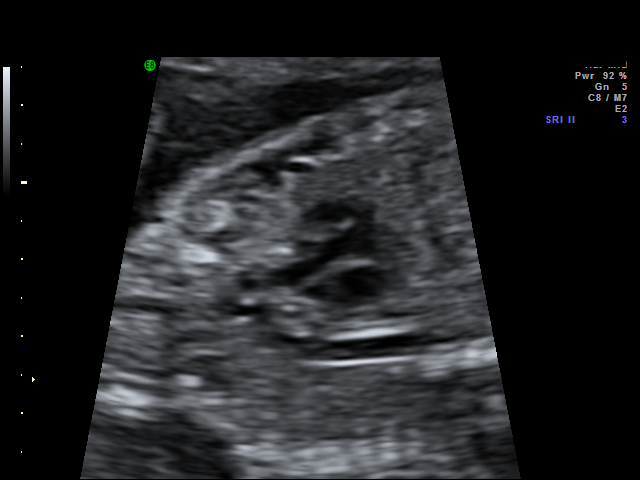
[im 105/114]
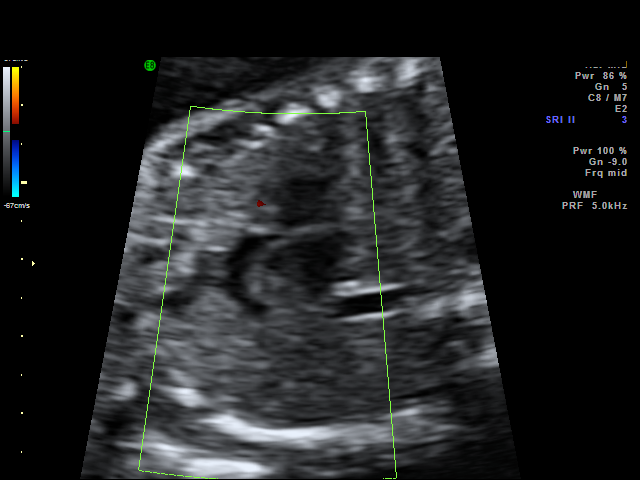
[im 114/114]
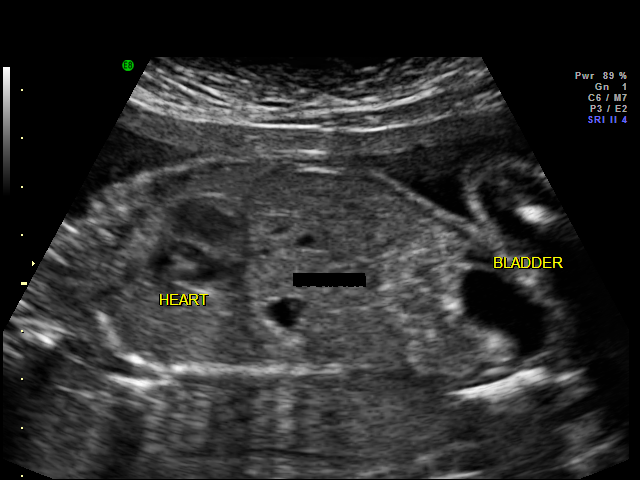

[18 of 28 positions shown; findings below may reference images not displayed]

Canned report from images found in remote index.

Refer to host system for actual result text.

## 2012-05-21 ENCOUNTER — Encounter (HOSPITAL_COMMUNITY): Payer: Self-pay | Admitting: *Deleted

## 2012-05-21 ENCOUNTER — Emergency Department (HOSPITAL_COMMUNITY)
Admission: EM | Admit: 2012-05-21 | Discharge: 2012-05-21 | Disposition: A | Discharge status: Home / Self Care | Payer: Self-pay | Attending: Emergency Medicine | Admitting: Emergency Medicine

## 2012-05-21 DIAGNOSIS — A499 Bacterial infection, unspecified: Secondary | ICD-10-CM | POA: Insufficient documentation

## 2012-05-21 DIAGNOSIS — N390 Urinary tract infection, site not specified: Secondary | ICD-10-CM | POA: Insufficient documentation

## 2012-05-21 DIAGNOSIS — N760 Acute vaginitis: Secondary | ICD-10-CM | POA: Insufficient documentation

## 2012-05-21 DIAGNOSIS — B9689 Other specified bacterial agents as the cause of diseases classified elsewhere: Secondary | ICD-10-CM | POA: Insufficient documentation

## 2012-05-21 DIAGNOSIS — Z87891 Personal history of nicotine dependence: Secondary | ICD-10-CM | POA: Insufficient documentation

## 2012-05-21 LAB — URINALYSIS, ROUTINE W REFLEX MICROSCOPIC
Nitrite: NEGATIVE
Specific Gravity, Urine: 1.02 (ref 1.005–1.030)
Urobilinogen, UA: 0.2 mg/dL (ref 0.0–1.0)
pH: 7 (ref 5.0–8.0)

## 2012-05-21 LAB — WET PREP, GENITAL: Yeast Wet Prep HPF POC: NONE SEEN

## 2012-05-21 LAB — URINE MICROSCOPIC-ADD ON

## 2012-05-21 MED ORDER — METRONIDAZOLE 500 MG PO TABS: 500.0000 mg | ORAL_TABLET | Freq: Two times a day (BID) | ORAL | Status: AC

## 2012-05-21 MED ORDER — NAPROXEN 250 MG PO TABS: 250.0000 mg | ORAL_TABLET | Freq: Two times a day (BID) | ORAL | Status: DC

## 2012-05-21 MED ORDER — TRAMADOL HCL 50 MG PO TABS: 50.0000 mg | ORAL_TABLET | Freq: Four times a day (QID) | ORAL | Status: AC | PRN

## 2012-05-21 MED ORDER — CEPHALEXIN 500 MG PO CAPS: 500.0000 mg | ORAL_CAPSULE | Freq: Four times a day (QID) | ORAL | Status: AC

## 2012-05-21 NOTE — ED Notes (Signed)
Back and low abd pain, dysuria,cl vag d/c.

## 2012-05-21 NOTE — ED Provider Notes (Signed)
History     CSN: [id number]  Arrival date & time 05/21/12  1749   First MD Initiated Contact with Patient 05/21/12 1902      Chief Complaint  Patient presents with  . Dysuria    HPI Pt was seen at 1910.  Per pt, c/o gradual onset and persistence of constant lower pelvic "pain", lower back "pain," vaginal discharge and dysuria for the past 1 week.  Pt describes her dysuria as urinary frequency and urgency.  Pt states she has an appt with her GYN in 3 days.  Denies N/V/D, no flank pain, no CP/SOB, no fevers, no rash.     History reviewed. No pertinent past medical history.  Past Surgical History  Procedure Date  . Tubal ligation   . Tonsillectomy     History  Substance Use Topics  . Smoking status: Former Games developer  . Smokeless tobacco: Not on file  . Alcohol Use: Yes     occ      Review of Systems ROS: Statement: All systems negative except as marked or noted in the HPI; Constitutional: Negative for fever and chills. ; ; Eyes: Negative for eye pain, redness and discharge. ; ; ENMT: Negative for ear pain, hoarseness, nasal congestion, sinus pressure and sore throat. ; ; Cardiovascular: Negative for chest pain, palpitations, diaphoresis, dyspnea and peripheral edema. ; ; Respiratory: Negative for cough, wheezing and stridor. ; ; Gastrointestinal: Negative for nausea, vomiting, diarrhea, abdominal pain, blood in stool, hematemesis, jaundice and rectal bleeding. . ; ; Genitourinary: +dysuria, urinary frequency/urgency. Negative for flank pain and hematuria. ; ; GYN:  No vaginal bleeding, +vaginal discharge, no vulvar pain.;; Musculoskeletal: +low back pain. Negative for neck pain. Negative for swelling and trauma.; ; Skin: Negative for pruritus, rash, abrasions, blisters, bruising and skin lesion.; ; Neuro: Negative for headache, lightheadedness and neck stiffness. Negative for weakness, altered level of consciousness , altered mental status, extremity weakness, paresthesias, involuntary  movement, seizure and syncope.       Allergies  Review of patient's allergies indicates no known allergies.  Home Medications   Current Outpatient Rx  Name Route Sig Dispense Refill  . IBUPROFEN 200 MG PO TABS Oral Take 200 mg by mouth every 6 (six) hours as needed.      BP 133/74  Pulse 80  Temp 98.4 F (36.9 C) (Oral)  Resp 20  Ht 5\' 6"  (1.676 m)  Wt 208 lb (94.348 kg)  BMI 33.57 kg/m2  SpO2 99%  LMP 05/15/2012  Physical Exam 1915: Physical examination:  Nursing notes reviewed; Vital signs and O2 SAT reviewed;  Constitutional: Well developed, Well nourished, Well hydrated, In no acute distress; Head:  Normocephalic, atraumatic; Eyes: EOMI, PERRL, No scleral icterus; ENMT: Mouth and pharynx normal, Mucous membranes moist; Neck: Supple, Full range of motion, No lymphadenopathy; Cardiovascular: Regular rate and rhythm, No murmur, rub, or gallop; Respiratory: Breath sounds clear & equal bilaterally, No rales, rhonchi, wheezes.  Speaking full sentences with ease, Normal respiratory effort/excursion; Chest: Nontender, Movement normal; Abdomen: Soft, Nontender, Nondistended, Normal bowel sounds; Genitourinary: No CVA tenderness, Pelvic exam performed with permission of pt and female ED tech assist during exam.  External genitalia w/o lesions. Vaginal vault with thin white discharge.  Cervix w/o lesions, not friable, GC/chlam and wet prep obtained and sent to lab.  Bimanual exam w/o CMT, uterine or adnexal tenderness.;; Spine:  No midline CS, TS, LS tenderness. +bilat bilat lower lumbar paraspinal muscles TTP.;; Extremities: Pulses normal, No tenderness, No edema, No calf  edema or asymmetry.; Neuro: AA&Ox3, Major CN grossly intact.  Speech clear. Climbs on and off stretcher without distress or difficulty.  Gait steady. No gross focal motor or sensory deficits in extremities.; Skin: Color normal, Warm, Dry.   ED Course  Procedures   MDM  MDM Reviewed: nursing note and  vitals Interpretation: labs   Results for orders placed during the hospital encounter of 05/21/12  URINALYSIS, ROUTINE W REFLEX MICROSCOPIC      Component Value Range   Color, Urine YELLOW  YELLOW   APPearance CLEAR  CLEAR   Specific Gravity, Urine 1.020  1.005 - 1.030   pH 7.0  5.0 - 8.0   Glucose, UA NEGATIVE  NEGATIVE mg/dL   Hgb urine dipstick NEGATIVE  NEGATIVE   Bilirubin Urine NEGATIVE  NEGATIVE   Ketones, ur NEGATIVE  NEGATIVE mg/dL   Protein, ur NEGATIVE  NEGATIVE mg/dL   Urobilinogen, UA 0.2  0.0 - 1.0 mg/dL   Nitrite NEGATIVE  NEGATIVE   Leukocytes, UA SMALL (*) NEGATIVE  WET PREP, GENITAL      Component Value Range   Yeast Wet Prep HPF POC NONE SEEN  NONE SEEN   Trich, Wet Prep NONE SEEN  NONE SEEN   Clue Cells Wet Prep HPF POC MODERATE (*) NONE SEEN   WBC, Wet Prep HPF POC FEW (*) NONE SEEN  POCT PREGNANCY, URINE      Component Value Range   Preg Test, Ur NEGATIVE  NEGATIVE  URINE MICROSCOPIC-ADD ON      Component Value Range   Squamous Epithelial / LPF FEW (*) RARE   WBC, UA 21-50  <3 WBC/hpf   RBC / HPF 3-6  <3 RBC/hpf   Bacteria, UA FEW (*) RARE     2000:  +UTI, +BV.  Will treat for both.  GC/chlam pending.  Pt continues to appear comfortable, wants to go home now.  Dx testing d/w pt.  Questions answered.  Verb understanding, agreeable to d/c home with outpt f/u.           Laray Anger, DO 05/23/12 1732

## 2012-05-21 NOTE — ED Notes (Signed)
Pt states has had dysuria, frequency x 1 week, with worsening symptoms. Pt denies dysuria and fever at this time. Pt states has lower abdominal and lower back pain. Pt is comfortable at this time playing on her phone.

## 2012-05-22 LAB — GC/CHLAMYDIA PROBE AMP, GENITAL: Chlamydia, DNA Probe: NEGATIVE

## 2012-06-04 ENCOUNTER — Encounter (HOSPITAL_COMMUNITY): Payer: Self-pay | Admitting: Emergency Medicine

## 2012-06-04 ENCOUNTER — Emergency Department (HOSPITAL_COMMUNITY): Payer: Self-pay

## 2012-06-04 ENCOUNTER — Emergency Department (HOSPITAL_COMMUNITY)
Admission: EM | Admit: 2012-06-04 | Discharge: 2012-06-04 | Disposition: A | Discharge status: Home / Self Care | Payer: Self-pay | Attending: Emergency Medicine | Admitting: Emergency Medicine

## 2012-06-04 DIAGNOSIS — Z87891 Personal history of nicotine dependence: Secondary | ICD-10-CM | POA: Insufficient documentation

## 2012-06-04 DIAGNOSIS — Y998 Other external cause status: Secondary | ICD-10-CM | POA: Insufficient documentation

## 2012-06-04 DIAGNOSIS — S93401A Sprain of unspecified ligament of right ankle, initial encounter: Secondary | ICD-10-CM

## 2012-06-04 DIAGNOSIS — S93409A Sprain of unspecified ligament of unspecified ankle, initial encounter: Secondary | ICD-10-CM | POA: Insufficient documentation

## 2012-06-04 DIAGNOSIS — W108XXA Fall (on) (from) other stairs and steps, initial encounter: Secondary | ICD-10-CM | POA: Insufficient documentation

## 2012-06-04 DIAGNOSIS — Y9389 Activity, other specified: Secondary | ICD-10-CM | POA: Insufficient documentation

## 2012-06-04 MED ORDER — HYDROCODONE-ACETAMINOPHEN 7.5-325 MG PO TABS: 1.0000 | ORAL_TABLET | ORAL | Status: AC | PRN

## 2012-06-04 MED ORDER — MELOXICAM 7.5 MG PO TABS: ORAL_TABLET | ORAL | Status: DC

## 2012-06-04 MED ORDER — HYDROCODONE-ACETAMINOPHEN 5-325 MG PO TABS
2.0000 | ORAL_TABLET | Freq: Once | ORAL | Status: AC
  Administered 2012-06-04: 2 via ORAL
  Filled 2012-06-04: qty 2

## 2012-06-04 MED ORDER — PROMETHAZINE HCL 12.5 MG PO TABS
12.5000 mg | ORAL_TABLET | Freq: Once | ORAL | Status: AC
  Administered 2012-06-04: 12.5 mg via ORAL
  Filled 2012-06-04: qty 1

## 2012-06-04 NOTE — ED Provider Notes (Signed)
Medical screening examination/treatment/procedure(s) were performed by non-physician practitioner and as supervising physician I was immediately available for consultation/collaboration.  Flint Melter, MD 06/04/12 2049

## 2012-06-04 NOTE — ED Notes (Signed)
Pt c/o of right ankle pain. States that she was chasing after child and fell down steps and ankle went behind her last night.

## 2012-06-04 NOTE — ED Provider Notes (Signed)
History     CSN: [id number]  Arrival date & time 06/04/12  1006   First MD Initiated Contact with Patient 06/04/12 1037      Chief Complaint  Patient presents with  . Ankle Injury  . Ankle Pain    (Consider location/radiation/quality/duration/timing/severity/associated sxs/prior treatment) Patient is a 26 y.o. female presenting with lower extremity injury and ankle pain. The history is provided by the patient.  Ankle Injury This is a new problem. The current episode started yesterday. The problem occurs constantly. The problem has been gradually worsening. Pertinent negatives include no abdominal pain, arthralgias, chest pain, coughing or neck pain. The symptoms are aggravated by standing and walking. She has tried NSAIDs for the symptoms. The treatment provided no relief.  Ankle Pain     History reviewed. No pertinent past medical history.  Past Surgical History  Procedure Date  . Tubal ligation   . Tonsillectomy     History reviewed. No pertinent family history.  History  Substance Use Topics  . Smoking status: Former Games developer  . Smokeless tobacco: Not on file  . Alcohol Use: Yes     occ    OB History    Grav Para Term Preterm Abortions TAB SAB Ect Mult Living                  Review of Systems  Constitutional: Negative for activity change.       All ROS Neg except as noted in HPI  HENT: Negative for nosebleeds and neck pain.   Eyes: Negative for photophobia and discharge.  Respiratory: Negative for cough, shortness of breath and wheezing.   Cardiovascular: Negative for chest pain and palpitations.  Gastrointestinal: Negative for abdominal pain and blood in stool.  Genitourinary: Positive for dysuria. Negative for frequency and hematuria.  Musculoskeletal: Negative for back pain and arthralgias.  Skin: Negative.   Neurological: Negative for dizziness, seizures and speech difficulty.  Psychiatric/Behavioral: Negative for hallucinations and confusion.     Allergies  Review of patient's allergies indicates no known allergies.  Home Medications   Current Outpatient Rx  Name Route Sig Dispense Refill  . CEPHALEXIN 500 MG PO CAPS Oral Take 500 mg by mouth 4 (four) times daily.    . IBUPROFEN 200 MG PO TABS Oral Take 400 mg by mouth every 6 (six) hours as needed. Pain    . METRONIDAZOLE 500 MG PO TABS Oral Take 500 mg by mouth 2 (two) times daily.    Marland Kitchen HYDROCODONE-ACETAMINOPHEN 7.5-325 MG PO TABS Oral Take 1 tablet by mouth every 4 (four) hours as needed for pain. 20 tablet 0  . MELOXICAM 7.5 MG PO TABS  1 po bid with food 12 tablet 0    BP 144/88  Pulse 129  Resp 18  Ht 5\' 6"  (1.676 m)  Wt 208 lb (94.348 kg)  BMI 33.57 kg/m2  SpO2 100%  LMP 05/15/2012  Physical Exam  Nursing note and vitals reviewed. Constitutional: She is oriented to person, place, and time. She appears well-developed and well-nourished.  Non-toxic appearance.  HENT:  Head: Normocephalic.  Right Ear: Tympanic membrane and external ear normal.  Left Ear: Tympanic membrane and external ear normal.  Eyes: EOM and lids are normal. Pupils are equal, round, and reactive to light.  Neck: Normal range of motion. Neck supple. Carotid bruit is not present.  Cardiovascular: Regular rhythm, normal heart sounds, intact distal pulses and normal pulses.  Tachycardia present.   Pulmonary/Chest: Breath sounds normal. No respiratory distress.  Abdominal: Soft. Bowel sounds are normal. There is no tenderness. There is no guarding.  Musculoskeletal:       Right ankle: She exhibits decreased range of motion and swelling. She exhibits normal pulse. tenderness. Lateral malleolus tenderness found.  Lymphadenopathy:       Head (right side): No submandibular adenopathy present.       Head (left side): No submandibular adenopathy present.    She has no cervical adenopathy.  Neurological: She is alert and oriented to person, place, and time. She has normal strength. No cranial nerve  deficit or sensory deficit.  Skin: Skin is warm and dry.  Psychiatric: She has a normal mood and affect. Her speech is normal.    ED Course  Procedures (including critical care time)  Labs Reviewed - No data to display Dg Ankle Complete Right  06/04/2012  *RADIOLOGY REPORT*  Clinical Data: Status post fall.  Pain.  RIGHT ANKLE - COMPLETE 3+ VIEW  Comparison: Plain films of the right foot 09/22/2009.  Findings: There is no acute bony or joint abnormality.  Tiny plantar calcaneal spur is noted.  Soft tissue structures are unremarkable.  IMPRESSION: No acute abnormality.   Original Report Authenticated By: Bernadene Bell. D'ALESSIO, M.D.      1. Right ankle sprain       MDM  I have reviewed nursing notes, vital signs, and all appropriate lab and imaging results for this patient. Xray of the right ankle is negative for fx or dislocation. Achilles intact. Pt fitted with ASO splint and crutches. Rx for mobic and Norco #20 given to the pt as well as ice pack.  Pt to see Dr Romeo Apple if not improving.       Kathie Dike, [place] 06/04/12 1147

## 2012-06-04 NOTE — ED Notes (Addendum)
Alert, sitting up in W/c , pain rt ankle , x ray already done. Pt wants to stay in w/c, says it is more comfortable. Ice pack applied.

## 2012-08-07 ENCOUNTER — Encounter (HOSPITAL_COMMUNITY): Payer: Self-pay | Admitting: *Deleted

## 2012-08-07 ENCOUNTER — Emergency Department (HOSPITAL_COMMUNITY)
Admission: EM | Admit: 2012-08-07 | Discharge: 2012-08-07 | Disposition: A | Discharge status: Home / Self Care | Payer: Medicaid Other | Attending: Emergency Medicine | Admitting: Emergency Medicine

## 2012-08-07 DIAGNOSIS — R1110 Vomiting, unspecified: Secondary | ICD-10-CM

## 2012-08-07 DIAGNOSIS — R197 Diarrhea, unspecified: Secondary | ICD-10-CM | POA: Insufficient documentation

## 2012-08-07 DIAGNOSIS — Z79899 Other long term (current) drug therapy: Secondary | ICD-10-CM | POA: Insufficient documentation

## 2012-08-07 DIAGNOSIS — N390 Urinary tract infection, site not specified: Secondary | ICD-10-CM | POA: Insufficient documentation

## 2012-08-07 DIAGNOSIS — R112 Nausea with vomiting, unspecified: Secondary | ICD-10-CM | POA: Insufficient documentation

## 2012-08-07 DIAGNOSIS — J029 Acute pharyngitis, unspecified: Secondary | ICD-10-CM | POA: Insufficient documentation

## 2012-08-07 DIAGNOSIS — Z87891 Personal history of nicotine dependence: Secondary | ICD-10-CM | POA: Insufficient documentation

## 2012-08-07 LAB — URINALYSIS, ROUTINE W REFLEX MICROSCOPIC
Bilirubin Urine: NEGATIVE
Ketones, ur: NEGATIVE mg/dL
Nitrite: NEGATIVE
Specific Gravity, Urine: 1.025 (ref 1.005–1.030)
Urobilinogen, UA: 1 mg/dL (ref 0.0–1.0)

## 2012-08-07 MED ORDER — ONDANSETRON 8 MG PO TBDP
8.0000 mg | ORAL_TABLET | Freq: Once | ORAL | Status: AC
  Administered 2012-08-07: 8 mg via ORAL
  Filled 2012-08-07: qty 1

## 2012-08-07 MED ORDER — CEPHALEXIN 500 MG PO CAPS: 500.0000 mg | ORAL_CAPSULE | Freq: Two times a day (BID) | ORAL | Status: DC

## 2012-08-07 MED ORDER — ONDANSETRON 8 MG PO TBDP: 8.0000 mg | ORAL_TABLET | Freq: Three times a day (TID) | ORAL | Status: DC | PRN

## 2012-08-07 NOTE — ED Notes (Signed)
Sore throat, since Sunday, NVD, feels weak

## 2012-08-07 NOTE — ED Provider Notes (Signed)
History   This chart was scribed for Joya Gaskins, MD by Gerlean Ren. This patient was seen in room APA05/APA05 and the patient's care was started at 11:53 AM .   CSN: [id number]  Arrival date & time 08/07/12  1059   First MD Initiated Contact with Patient 08/07/12 1111      Chief Complaint  Patient presents with  . Emesis     The history is provided by the patient. No language interpreter was used.   Laura Lopez is a 26 y.o. female who presents to the Emergency Department complaining of constant non-bloody, non-bilious emesis with gradual onset 3 days ago and associated nausea, non-bloody diarrhea, cramping, non-radiating abdominal pain, generalized weakness, non-productive cough, and sore throat.  Pt denies urinary symptoms.  Pt has no h/o chronic medical conditions.  Pt is a former smoker and reports occasional alcohol use.    PMH - none  Past Surgical History  Procedure Date  . Tubal ligation   . Tonsillectomy     History reviewed. No pertinent family history.  History  Substance Use Topics  . Smoking status: Former Games developer  . Smokeless tobacco: Not on file  . Alcohol Use: Yes     Comment: occ    No OB history provided.  Review of Systems  HENT: Positive for sore throat.   Gastrointestinal: Positive for nausea, vomiting and diarrhea.  Neurological: Positive for weakness.  All other systems reviewed and are negative.    Allergies  Review of patient's allergies indicates no known allergies.  Home Medications   Current Outpatient Rx  Name  Route  Sig  Dispense  Refill  . CEPHALEXIN 500 MG PO CAPS   Oral   Take 500 mg by mouth 4 (four) times daily.         . IBUPROFEN 200 MG PO TABS   Oral   Take 400 mg by mouth every 6 (six) hours as needed. Pain         . MELOXICAM 7.5 MG PO TABS      1 po bid with food   12 tablet   0   . METRONIDAZOLE 500 MG PO TABS   Oral   Take 500 mg by mouth 2 (two) times daily.           BP 134/76   Pulse 112  Temp 99 F (37.2 C) (Oral)  Resp 20  Ht 5\' 6"  (1.676 m)  Wt 208 lb (94.348 kg)  BMI 33.57 kg/m2  SpO2 100%  LMP 07/13/2012  Physical Exam CONSTITUTIONAL: Well developed/well nourished HEAD AND FACE: Normocephalic/atraumatic EYES: EOMI/PERRL, no scleral icterus ENMT: Mucous membranes dry NECK: supple no meningeal signs SPINE:entire spine nontender CV: S1/S2 noted, no murmurs/rubs/gallops noted LUNGS: Lungs are clear to auscultation bilaterally, no apparent distress ABDOMEN: soft, nontender, no rebound or guarding GU:no cva tenderness NEURO: Pt is awake/alert, moves all extremitiesx4 EXTREMITIES: pulses normal, full ROM SKIN: warm, color normal PSYCH: no abnormalities of mood noted  ED Course  Procedures  DIAGNOSTIC STUDIES: Oxygen Saturation is 100% on room air, normal by my interpretation.    COORDINATION OF CARE: 11:55 AM-Patient informed of clinical course, understands medical decision-making process, and agrees with plan.  Ordered PO zofran.  Pt improved.  Taking PO Abdomen soft I doubt acute abd process.  uti noted, will start keflex    Labs Reviewed  URINALYSIS, ROUTINE W REFLEX MICROSCOPIC   Results for orders placed during the hospital encounter of 08/07/12  URINALYSIS, ROUTINE  W REFLEX MICROSCOPIC      Component Value Range   Color, Urine YELLOW  YELLOW   APPearance CLOUDY (*) CLEAR   Specific Gravity, Urine 1.025  1.005 - 1.030   pH 6.0  5.0 - 8.0   Glucose, UA NEGATIVE  NEGATIVE mg/dL   Hgb urine dipstick TRACE (*) NEGATIVE   Bilirubin Urine NEGATIVE  NEGATIVE   Ketones, ur NEGATIVE  NEGATIVE mg/dL   Protein, ur 30 (*) NEGATIVE mg/dL   Urobilinogen, UA 1.0  0.0 - 1.0 mg/dL   Nitrite NEGATIVE  NEGATIVE   Leukocytes, UA MODERATE (*) NEGATIVE  POCT PREGNANCY, URINE      Component Value Range   Preg Test, Ur NEGATIVE  NEGATIVE  URINE MICROSCOPIC-ADD ON      Component Value Range   WBC, UA 21-50  <3 WBC/hpf   Bacteria, UA RARE  RARE        MDM  Nursing notes including past medical history and social history reviewed and considered in documentation Labs/vital reviewed and considered    I personally performed the services described in this documentation, which was scribed in my presence. The recorded information has been reviewed and considered.         Joya Gaskins, MD 08/07/12 [phone]

## 2012-12-22 ENCOUNTER — Encounter (HOSPITAL_COMMUNITY): Payer: Self-pay | Admitting: *Deleted

## 2012-12-22 ENCOUNTER — Emergency Department (HOSPITAL_COMMUNITY)
Admission: EM | Admit: 2012-12-22 | Discharge: 2012-12-22 | Disposition: A | Discharge status: Home / Self Care | Payer: Medicaid Other | Attending: Emergency Medicine | Admitting: Emergency Medicine

## 2012-12-22 DIAGNOSIS — N390 Urinary tract infection, site not specified: Secondary | ICD-10-CM | POA: Insufficient documentation

## 2012-12-22 DIAGNOSIS — Z3202 Encounter for pregnancy test, result negative: Secondary | ICD-10-CM | POA: Insufficient documentation

## 2012-12-22 DIAGNOSIS — Z87891 Personal history of nicotine dependence: Secondary | ICD-10-CM | POA: Insufficient documentation

## 2012-12-22 LAB — URINALYSIS, ROUTINE W REFLEX MICROSCOPIC
Bilirubin Urine: NEGATIVE
Hgb urine dipstick: NEGATIVE
Specific Gravity, Urine: >1.03 — ABNORMAL HIGH (ref 1.005–1.030)
pH: 6 (ref 5.0–8.0)

## 2012-12-22 LAB — URINE MICROSCOPIC-ADD ON

## 2012-12-22 MED ORDER — CEPHALEXIN 500 MG PO CAPS: 500.0000 mg | ORAL_CAPSULE | Freq: Four times a day (QID) | ORAL | Status: DC

## 2012-12-22 MED ORDER — CEPHALEXIN 500 MG PO CAPS
500.0000 mg | ORAL_CAPSULE | Freq: Once | ORAL | Status: AC
  Administered 2012-12-22: 500 mg via ORAL
  Filled 2012-12-22: qty 1

## 2012-12-22 MED ORDER — HYDROCODONE-ACETAMINOPHEN 5-325 MG PO TABS: ORAL_TABLET | ORAL | Status: DC

## 2012-12-22 MED ORDER — HYDROCODONE-ACETAMINOPHEN 5-325 MG PO TABS
1.0000 | ORAL_TABLET | Freq: Once | ORAL | Status: AC
  Administered 2012-12-22: 1 via ORAL
  Filled 2012-12-22: qty 1

## 2012-12-22 NOTE — ED Provider Notes (Signed)
History     CSN: [id number]  Arrival date & time 12/22/12  4098   First MD Initiated Contact with Patient 12/22/12 [phone]      Chief Complaint  Patient presents with  . Back Pain    (Consider location/radiation/quality/duration/timing/severity/associated sxs/prior treatment) Patient is a 27 y.o. female presenting with back pain. The history is provided by the patient.  Back Pain Location:  Lumbar spine Quality:  Aching Radiates to:  Does not radiate Pain severity:  Moderate Onset quality:  Gradual Duration:  2 days Timing:  Constant Progression:  Worsening Chronicity:  New Relieved by:  Nothing Worsened by:  Ambulation, bending and movement Ineffective treatments:  None tried Associated symptoms: no abdominal pain, no abdominal swelling, no bladder incontinence, no bowel incontinence, no chest pain, no dysuria, no fever, no headaches, no leg pain, no numbness, no paresthesias, no pelvic pain, no perianal numbness, no tingling and no weakness     History reviewed. No pertinent past medical history.  Past Surgical History  Procedure Laterality Date  . Tubal ligation    . Tonsillectomy      No family history on file.  History  Substance Use Topics  . Smoking status: Former Games developer  . Smokeless tobacco: Not on file  . Alcohol Use: Yes     Comment: occ    OB History   Grav Para Term Preterm Abortions TAB SAB Ect Mult Living                  Review of Systems  Constitutional: Negative for fever.  Respiratory: Negative for shortness of breath.   Cardiovascular: Negative for chest pain.  Gastrointestinal: Negative for vomiting, abdominal pain, constipation and bowel incontinence.  Genitourinary: Negative for bladder incontinence, dysuria, hematuria, flank pain, decreased urine volume, difficulty urinating and pelvic pain.       No perineal numbness or incontinence of urine or feces  Musculoskeletal: Positive for back pain. Negative for joint swelling.  Skin:  Negative for rash.  Neurological: Negative for tingling, weakness, numbness, headaches and paresthesias.  All other systems reviewed and are negative.    Allergies  Review of patient's allergies indicates no known allergies.  Home Medications   Current Outpatient Rx  Name  Route  Sig  Dispense  Refill  . cephALEXin (KEFLEX) 500 MG capsule   Oral   Take 1 capsule (500 mg total) by mouth 2 (two) times daily.   14 capsule   0   . ibuprofen (ADVIL,MOTRIN) 200 MG tablet   Oral   Take 400 mg by mouth every 6 (six) hours as needed. Pain         . ondansetron (ZOFRAN ODT) 8 MG disintegrating tablet   Oral   Take 1 tablet (8 mg total) by mouth every 8 (eight) hours as needed for nausea.   20 tablet   0     BP 122/77  Pulse 84  Temp(Src) 97.7 F (36.5 C) (Oral)  Resp 18  SpO2 100%  LMP 11/22/2012  Physical Exam  Nursing note and vitals reviewed. Constitutional: She is oriented to person, place, and time. She appears well-developed and well-nourished. No distress.  HENT:  Head: Normocephalic and atraumatic.  Neck: Normal range of motion. Neck supple.  Cardiovascular: Normal rate, regular rhythm and intact distal pulses.   No murmur heard. Pulmonary/Chest: Effort normal and breath sounds normal.  Abdominal: Soft. Normal appearance. She exhibits no distension. There is no hepatosplenomegaly. There is no tenderness. There is no rigidity, no  rebound, no guarding, no CVA tenderness and no tenderness at McBurney's point.  Musculoskeletal: She exhibits tenderness. She exhibits no edema.       Lumbar back: She exhibits tenderness and pain. She exhibits normal range of motion, no swelling, no deformity, no laceration and normal pulse.       Back:  ttp of the lumbar paraspinal muscles.  No spinal tenderness.  DP pulses are brisk, distal sensation itnact  Neurological: She is alert and oriented to person, place, and time. No cranial nerve deficit or sensory deficit. She exhibits  normal muscle tone. Coordination and gait normal.  Reflex Scores:      Patellar reflexes are 2+ on the right side and 2+ on the left side.      Achilles reflexes are 2+ on the right side and 2+ on the left side. Skin: Skin is warm and dry.    ED Course  Procedures (including critical care time)  Results for orders placed during the hospital encounter of 12/22/12  URINALYSIS, ROUTINE W REFLEX MICROSCOPIC      Result Value Range   Color, Urine YELLOW  YELLOW   APPearance HAZY (*) CLEAR   Specific Gravity, Urine >1.030 (*) 1.005 - 1.030   pH 6.0  5.0 - 8.0   Glucose, UA NEGATIVE  NEGATIVE mg/dL   Hgb urine dipstick NEGATIVE  NEGATIVE   Bilirubin Urine NEGATIVE  NEGATIVE   Ketones, ur NEGATIVE  NEGATIVE mg/dL   Protein, ur NEGATIVE  NEGATIVE mg/dL   Urobilinogen, UA 0.2  0.0 - 1.0 mg/dL   Nitrite POSITIVE (*) NEGATIVE   Leukocytes, UA NEGATIVE  NEGATIVE  URINE MICROSCOPIC-ADD ON      Result Value Range   Squamous Epithelial / LPF FEW (*) RARE   WBC, UA 3-6  <3 WBC/hpf   Bacteria, UA MANY (*) RARE  POCT PREGNANCY, URINE      Result Value Range   Preg Test, Ur NEGATIVE  NEGATIVE     Urine culture pending   MDM    Patient has ttp of the lumbar paraspinal muscles.  No focal neuro deficits on exam.  Ambulates with a steady gait.  Pain is reproduced with SLR bilaterally.  No fever, vomiting , abdominal pain or CVA tenderness to suggest pyelonephritis.  Doubt emergent neurological or infectious process.  Pt agrees to antibiotic, short course of pain medication, and close f/u with her PMD.     The patient appears reasonably screened and/or stabilized for discharge and I doubt any other medical condition or other [facility] requiring further screening, evaluation, or treatment in the ED at this time prior to discharge.    Prescribed: Keflex norco #10     Quentin Shorey L. Trisha Mangle, PA-C 12/23/12 2228

## 2012-12-22 NOTE — ED Notes (Addendum)
Pt c/o lower back pain that is described as intermittent shooting that started two days ago, denies any injury. Denies any urinary symptoms

## 2012-12-22 NOTE — ED Notes (Signed)
Pt states she is unable to provide a urine sample at present.

## 2012-12-24 LAB — URINE CULTURE

## 2012-12-25 ENCOUNTER — Telehealth (HOSPITAL_COMMUNITY): Payer: Self-pay | Admitting: Emergency Medicine

## 2012-12-25 NOTE — ED Provider Notes (Signed)
History/physical exam/procedure(s) were performed by non-physician practitioner and as supervising physician I was immediately available for consultation/collaboration. I have reviewed all notes and am in agreement with care and plan.   Shaindel Sweeten S Mavryk Pino, MD 12/25/12 1345 

## 2012-12-25 NOTE — ED Notes (Signed)
Results received from [facility]. (+) URNC  Rx given in ED for Keflex -> sensitive to the same.  Chart appended per protocol. 

## 2013-01-24 ENCOUNTER — Emergency Department (HOSPITAL_COMMUNITY)
Admission: EM | Admit: 2013-01-24 | Discharge: 2013-01-24 | Disposition: A | Discharge status: Home / Self Care | Payer: Medicaid Other | Attending: Emergency Medicine | Admitting: Emergency Medicine

## 2013-01-24 ENCOUNTER — Encounter (HOSPITAL_COMMUNITY): Payer: Self-pay | Admitting: *Deleted

## 2013-01-24 ENCOUNTER — Emergency Department (HOSPITAL_COMMUNITY): Payer: Medicaid Other

## 2013-01-24 DIAGNOSIS — Z87891 Personal history of nicotine dependence: Secondary | ICD-10-CM | POA: Insufficient documentation

## 2013-01-24 DIAGNOSIS — R110 Nausea: Secondary | ICD-10-CM | POA: Insufficient documentation

## 2013-01-24 DIAGNOSIS — Z9851 Tubal ligation status: Secondary | ICD-10-CM | POA: Insufficient documentation

## 2013-01-24 DIAGNOSIS — R6883 Chills (without fever): Secondary | ICD-10-CM | POA: Insufficient documentation

## 2013-01-24 DIAGNOSIS — R5381 Other malaise: Secondary | ICD-10-CM | POA: Insufficient documentation

## 2013-01-24 DIAGNOSIS — R109 Unspecified abdominal pain: Secondary | ICD-10-CM

## 2013-01-24 DIAGNOSIS — R1013 Epigastric pain: Secondary | ICD-10-CM | POA: Insufficient documentation

## 2013-01-24 DIAGNOSIS — R51 Headache: Secondary | ICD-10-CM | POA: Insufficient documentation

## 2013-01-24 LAB — CBC WITH DIFFERENTIAL/PLATELET
Eosinophils Absolute: 0.2 10*3/uL (ref 0.0–0.7)
HCT: 42.5 % (ref 36.0–46.0)
Hemoglobin: 15.4 g/dL — ABNORMAL HIGH (ref 12.0–15.0)
Lymphs Abs: 4.2 10*3/uL — ABNORMAL HIGH (ref 0.7–4.0)
MCH: 30.3 pg (ref 26.0–34.0)
MCV: 83.5 fL (ref 78.0–100.0)
Monocytes Relative: 9 % (ref 3–12)
Neutrophils Relative %: 63 % (ref 43–77)
RBC: 5.09 MIL/uL (ref 3.87–5.11)

## 2013-01-24 LAB — COMPREHENSIVE METABOLIC PANEL
Alkaline Phosphatase: 89 U/L (ref 39–117)
BUN: 4 mg/dL — ABNORMAL LOW (ref 6–23)
GFR calc Af Amer: >90 mL/min (ref >90)
Glucose, Bld: 87 mg/dL (ref 70–99)
Potassium: 3.3 mEq/L — ABNORMAL LOW (ref 3.5–5.1)
Total Bilirubin: 0.3 mg/dL (ref 0.3–1.2)
Total Protein: 8.3 g/dL (ref 6.0–8.3)

## 2013-01-24 LAB — LIPASE, BLOOD: Lipase: 24 U/L (ref 11–59)

## 2013-01-24 MED ORDER — FAMOTIDINE IN NACL 20-0.9 MG/50ML-% IV SOLN
20.0000 mg | Freq: Once | INTRAVENOUS | Status: AC
  Administered 2013-01-24: 20 mg via INTRAVENOUS
  Filled 2013-01-24: qty 50

## 2013-01-24 MED ORDER — HYDROMORPHONE HCL PF 1 MG/ML IJ SOLN
1.0000 mg | Freq: Once | INTRAMUSCULAR | Status: AC
  Administered 2013-01-24: 1 mg via INTRAVENOUS

## 2013-01-24 MED ORDER — ONDANSETRON HCL 4 MG/2ML IJ SOLN
4.0000 mg | Freq: Once | INTRAMUSCULAR | Status: AC
  Administered 2013-01-24: 4 mg via INTRAVENOUS
  Filled 2013-01-24: qty 2

## 2013-01-24 MED ORDER — OMEPRAZOLE 20 MG PO CPDR: 20.0000 mg | DELAYED_RELEASE_CAPSULE | Freq: Every day | ORAL | Status: DC

## 2013-01-24 MED ORDER — HYDROMORPHONE HCL PF 1 MG/ML IJ SOLN
1.0000 mg | Freq: Once | INTRAMUSCULAR | Status: AC
  Administered 2013-01-24: 1 mg via INTRAVENOUS
  Filled 2013-01-24: qty 1

## 2013-01-24 MED ORDER — SODIUM CHLORIDE 0.9 % IV BOLUS (SEPSIS)
1000.0000 mL | Freq: Once | INTRAVENOUS | Status: AC
  Administered 2013-01-24: 1000 mL via INTRAVENOUS

## 2013-01-24 MED ORDER — IOHEXOL 300 MG/ML  SOLN
50.0000 mL | Freq: Once | INTRAMUSCULAR | Status: AC | PRN
  Administered 2013-01-24: 50 mL via ORAL

## 2013-01-24 MED ORDER — IOHEXOL 300 MG/ML  SOLN
100.0000 mL | Freq: Once | INTRAMUSCULAR | Status: AC | PRN
  Administered 2013-01-24: 100 mL via INTRAVENOUS

## 2013-01-24 MED ORDER — SUCRALFATE 1 GM/10ML PO SUSP: 1.0000 g | Freq: Four times a day (QID) | ORAL | Status: DC

## 2013-01-24 MED ORDER — HYDROMORPHONE HCL PF 1 MG/ML IJ SOLN
INTRAMUSCULAR | Status: AC
  Administered 2013-01-24: 1 mg via INTRAVENOUS
  Filled 2013-01-24: qty 1

## 2013-01-24 MED ORDER — GI COCKTAIL ~~LOC~~
30.0000 mL | Freq: Once | ORAL | Status: AC
  Administered 2013-01-24: 30 mL via ORAL
  Filled 2013-01-24: qty 30

## 2013-01-24 NOTE — ED Notes (Signed)
Pt c/o nausea and abd pain since this morning.  Denies vomiting or diarrhea.  LBM was yesterday.  Denies any urinary symptoms or abnormal vaginal bleeding or discharge.

## 2013-01-24 NOTE — ED Provider Notes (Signed)
History     This chart was scribed for Gerhard Munch, MD, MD by Smitty Pluck, ED Scribe. The patient was seen in room APA05/APA05 and the patient's care was started at 2:55PM.   CSN: [id number]  Arrival date & time 01/24/13  1418      Chief Complaint  Patient presents with  . Nausea  . Abdominal Pain    (Consider location/radiation/quality/duration/timing/severity/associated sxs/prior treatment) The history is provided by the patient and medical records. No language interpreter was used.   Laura Lopez is a 27 y.o. female who presents to the Emergency Department complaining of constant, moderate nausea and upper abdominal pain onset today. Pt reports gradual onset after she awoke. She reports having associated chills, generalized fatigue and headache. She denies taking medication PTA. LBM was 1 day ago. Pt denies fever, vomiting, vaginal bleeding, dysuria, vaginal discharge, diarrhea, weakness, cough, SOB and any other pain.   History reviewed. No pertinent past medical history.  Past Surgical History  Procedure Laterality Date  . Tubal ligation    . Tonsillectomy      No family history on file.  History  Substance Use Topics  . Smoking status: Former Games developer  . Smokeless tobacco: Not on file  . Alcohol Use: Yes     Comment: occ    OB History   Grav Para Term Preterm Abortions TAB SAB Ect Mult Living                  Review of Systems  Constitutional:       Per HPI, otherwise negative  HENT:       Per HPI, otherwise negative  Respiratory:       Per HPI, otherwise negative  Cardiovascular:       Per HPI, otherwise negative  Gastrointestinal: Positive for abdominal pain. Negative for vomiting.  Endocrine:       Negative aside from HPI  Genitourinary:       Neg aside from HPI   Musculoskeletal:       Per HPI, otherwise negative  Skin: Negative.   Neurological: Negative for syncope.    Allergies  Review of patient's allergies indicates no known  allergies.  Home Medications   Current Outpatient Rx  Name  Route  Sig  Dispense  Refill  . cephALEXin (KEFLEX) 500 MG capsule   Oral   Take 1 capsule (500 mg total) by mouth 2 (two) times daily.   14 capsule   0   . cephALEXin (KEFLEX) 500 MG capsule   Oral   Take 1 capsule (500 mg total) by mouth 4 (four) times daily. For 7 days   28 capsule   0   . HYDROcodone-acetaminophen (NORCO/VICODIN) 5-325 MG per tablet      Take one-two tabs po q 4-6 hrs prn pain   10 tablet   0   . ibuprofen (ADVIL,MOTRIN) 200 MG tablet   Oral   Take 400 mg by mouth every 6 (six) hours as needed. Pain         . ondansetron (ZOFRAN ODT) 8 MG disintegrating tablet   Oral   Take 1 tablet (8 mg total) by mouth every 8 (eight) hours as needed for nausea.   20 tablet   0     BP 119/86  Pulse 116  Temp(Src) 98.5 F (36.9 C) (Oral)  Resp 20  Ht 5\' 6"  (1.676 m)  Wt 206 lb (93.441 kg)  BMI 33.27 kg/m2  SpO2 99%  LMP 01/10/2013  Physical Exam  Nursing note and vitals reviewed. Constitutional: She appears well-developed and well-nourished. No distress.  HENT:  Head: Normocephalic and atraumatic.  Eyes: Conjunctivae are normal. Pupils are equal, round, and reactive to light.  Cardiovascular: Normal rate, regular rhythm and normal heart sounds.   No murmur heard. Pulmonary/Chest: Effort normal and breath sounds normal. No respiratory distress. She has no wheezes. She has no rales.  Abdominal: Soft. Bowel sounds are normal. There is tenderness (epigastric).  No peritoneal signs No RUQRLQ pain     ED Course  Procedures (including critical care time) DIAGNOSTIC STUDIES: Oxygen Saturation is 99% on room air, normal by my interpretation.    COORDINATION OF CARE: 2:59 PM Discussed ED treatment with pt and pt agrees.     Labs Reviewed - No data to display No results found.   No diagnosis found.  6:00 PM Patient still in pain. meds and CT ordered.  I personally performed the  services described in this documentation, which was scribed in my presence. The recorded information has been reviewed and is accurate.  Update: CT negative  MDM  I personally performed the services described in this documentation, which was scribed in my presence. The recorded information has been reviewed and is accurate.  This patient presents with ongoing abdominal pain.  The pain is largely in the upper abdomen, some.  Umbilical description as well as the persistent pain in spite of analgesics, raises concern for acute appendicitis.  This was not demonstrated on CT scan.  The patient did have some improvement, but remained in pain.  She does have mild leukocytosis suggestive of ongoing reactive condition, likely esophageal or gastric in origin.  She was started on a trial of PPI, Carafate, discharged with GI followup.  Gerhard Munch, MD 01/24/13 [phone]

## 2013-01-25 ENCOUNTER — Emergency Department (HOSPITAL_COMMUNITY)
Admission: EM | Admit: 2013-01-25 | Discharge: 2013-01-25 | Disposition: A | Discharge status: Home / Self Care | Payer: Medicaid Other | Attending: Emergency Medicine | Admitting: Emergency Medicine

## 2013-01-25 ENCOUNTER — Encounter (HOSPITAL_COMMUNITY): Payer: Self-pay | Admitting: *Deleted

## 2013-01-25 DIAGNOSIS — Z87891 Personal history of nicotine dependence: Secondary | ICD-10-CM | POA: Insufficient documentation

## 2013-01-25 DIAGNOSIS — R109 Unspecified abdominal pain: Secondary | ICD-10-CM

## 2013-01-25 DIAGNOSIS — Z79899 Other long term (current) drug therapy: Secondary | ICD-10-CM | POA: Insufficient documentation

## 2013-01-25 DIAGNOSIS — R110 Nausea: Secondary | ICD-10-CM | POA: Insufficient documentation

## 2013-01-25 DIAGNOSIS — Z9851 Tubal ligation status: Secondary | ICD-10-CM | POA: Insufficient documentation

## 2013-01-25 DIAGNOSIS — R1013 Epigastric pain: Secondary | ICD-10-CM | POA: Insufficient documentation

## 2013-01-25 LAB — COMPREHENSIVE METABOLIC PANEL
ALT: 16 U/L (ref 0–35)
AST: 21 U/L (ref 0–37)
Albumin: 3.6 g/dL (ref 3.5–5.2)
Calcium: 9.3 mg/dL (ref 8.4–10.5)
Sodium: 136 mEq/L (ref 135–145)
Total Protein: 7.5 g/dL (ref 6.0–8.3)

## 2013-01-25 LAB — URINALYSIS, ROUTINE W REFLEX MICROSCOPIC
Glucose, UA: NEGATIVE mg/dL
Hgb urine dipstick: NEGATIVE
Leukocytes, UA: NEGATIVE
Specific Gravity, Urine: 1.025 (ref 1.005–1.030)

## 2013-01-25 LAB — CBC WITH DIFFERENTIAL/PLATELET
Basophils Absolute: 0 10*3/uL (ref 0.0–0.1)
Basophils Relative: 0 % (ref 0–1)
Eosinophils Absolute: 0.3 10*3/uL (ref 0.0–0.7)
Eosinophils Relative: 3 % (ref 0–5)
Lymphocytes Relative: 22 % (ref 12–46)
MCHC: 35.9 g/dL (ref 30.0–36.0)
MCV: 84.8 fL (ref 78.0–100.0)
Platelets: 367 10*3/uL (ref 150–400)
RDW: 13.4 % (ref 11.5–15.5)
WBC: 11.1 10*3/uL — ABNORMAL HIGH (ref 4.0–10.5)

## 2013-01-25 MED ORDER — GI COCKTAIL ~~LOC~~
30.0000 mL | Freq: Once | ORAL | Status: AC
  Administered 2013-01-25: 30 mL via ORAL
  Filled 2013-01-25: qty 30

## 2013-01-25 MED ORDER — HYDROMORPHONE HCL PF 1 MG/ML IJ SOLN
0.5000 mg | Freq: Once | INTRAMUSCULAR | Status: AC
  Administered 2013-01-25: 0.5 mg via INTRAVENOUS
  Filled 2013-01-25: qty 1

## 2013-01-25 MED ORDER — ONDANSETRON HCL 4 MG/2ML IJ SOLN
4.0000 mg | Freq: Once | INTRAMUSCULAR | Status: AC
  Administered 2013-01-25: 4 mg via INTRAVENOUS
  Filled 2013-01-25: qty 2

## 2013-01-25 MED ORDER — SUCRALFATE 1 GM/10ML PO SUSP
1.0000 g | Freq: Three times a day (TID) | ORAL | Status: DC
  Administered 2013-01-25: 1 g via ORAL
  Filled 2013-01-25 (×7): qty 10

## 2013-01-25 MED ORDER — SODIUM CHLORIDE 0.9 % IV SOLN
1000.0000 mL | Freq: Once | INTRAVENOUS | Status: AC
  Administered 2013-01-25: 1000 mL via INTRAVENOUS

## 2013-01-25 NOTE — ED Notes (Signed)
Pt reporting general abdominal pain. States was seen yesterday for same.  Reports nausea, no vomiting.  2 episodes of diarrhea.  No relief from medications prescribed yesterday.

## 2013-01-25 NOTE — ED Notes (Signed)
AC called for meds. 

## 2013-01-25 NOTE — ED Provider Notes (Signed)
History     CSN: [id number]  Arrival date & time 01/25/13  1919   First MD Initiated Contact with Patient 01/25/13 1925      Chief Complaint  Patient presents with  . Abdominal Pain    (Consider location/radiation/quality/duration/timing/severity/associated sxs/prior treatment) HPI The patient presents with concerns of ongoing abdominal pain.  It began 2 days ago, since onset has been persistent, with minor relief yesterday during emergency department evaluation.  She states that since discharge she is taking the medication as directed, but continues to have pain.  The pain is sore, nonradiating, and the upper abdomen.  There is associated nausea. No new dyspnea, diarrhea, confusion, disorientation or other notable new occurrences. She states that though she understands the prescribed course medication should take up to several days to work, and it has been less than 24 hours, she is concerned over the persistency of her pain.  History reviewed. No pertinent past medical history.  Past Surgical History  Procedure Laterality Date  . Tubal ligation    . Tonsillectomy      History reviewed. No pertinent family history.  History  Substance Use Topics  . Smoking status: Former Games developer  . Smokeless tobacco: Not on file  . Alcohol Use: Yes     Comment: occ    OB History   Grav Para Term Preterm Abortions TAB SAB Ect Mult Living                  Review of Systems  Constitutional:       Per HPI, otherwise negative  HENT:       Per HPI, otherwise negative  Respiratory:       Per HPI, otherwise negative  Cardiovascular:       Per HPI, otherwise negative  Gastrointestinal: Positive for abdominal pain. Negative for vomiting.  Endocrine:       Negative aside from HPI  Genitourinary:       Neg aside from HPI   Musculoskeletal:       Per HPI, otherwise negative  Skin: Negative.   Neurological: Negative for syncope.    Allergies  Review of patient's allergies indicates  no known allergies.  Home Medications   Current Outpatient Rx  Name  Route  Sig  Dispense  Refill  . omeprazole (PRILOSEC) 20 MG capsule   Oral   Take 1 capsule (20 mg total) by mouth daily.   20 capsule   0   . sucralfate (CARAFATE) 1 GM/10ML suspension   Oral   Take 10 mLs (1 g total) by mouth 4 (four) times daily.   420 mL   0     BP 112/62  Pulse 104  Temp(Src) 99.4 F (37.4 C) (Oral)  Resp 18  Ht 5\' 6"  (1.676 m)  Wt 206 lb (93.441 kg)  BMI 33.27 kg/m2  SpO2 100%  LMP 01/10/2013  Physical Exam  Nursing note and vitals reviewed. Constitutional: She appears well-developed and well-nourished. No distress.  HENT:  Head: Normocephalic and atraumatic.  Eyes: Conjunctivae are normal. Pupils are equal, round, and reactive to light.  Cardiovascular: Normal rate, regular rhythm and normal heart sounds.   No murmur heard. Pulmonary/Chest: Effort normal and breath sounds normal. No respiratory distress. She has no wheezes. She has no rales.  Abdominal: Soft. Bowel sounds are normal. There is tenderness (epigastric).  No peritoneal signs No RUQRLQ pain     ED Course  Procedures (including critical care time)  Labs Reviewed  URINALYSIS, ROUTINE  W REFLEX MICROSCOPIC - Abnormal; Notable for the following:    Urobilinogen, UA 4.0 (*)    All other components within normal limits  CBC WITH DIFFERENTIAL  COMPREHENSIVE METABOLIC PANEL  LIPASE, BLOOD   Ct Abdomen Pelvis W Contrast  01/24/2013  *RADIOLOGY REPORT*  Clinical Data: Abdominal pain.  Nausea and vomiting.  CT ABDOMEN AND PELVIS WITH CONTRAST  Technique:  Multidetector CT imaging of the abdomen and pelvis was performed following the standard protocol during bolus administration of intravenous contrast.  Contrast: 50mL OMNIPAQUE IOHEXOL 300 MG/ML  SOLN, OMNIPAQUE IOHEXOL 300 MG/ML  SOLN  Comparison: 09/15/2008; report from 08/27/2001  Findings: Subtle 4 mm hypodensity in segment seven of the liver, image 18 of  series 2, likely incidental.  Liver otherwise unremarkable.  The spleen, pancreas, adrenal glands, gallbladder, and biliary system remain otherwise unremarkable and CT appearance.  Small hypodensity anteriorly in the right mid kidney, image 31 of series 2, probably a small cyst but technically too small to characterize.  A second tiny hypodensity in the right kidney is shown on image 56 of series 4, likely a cyst.  No compelling findings of renal calculi.  No hydronephrosis or hydroureter. There is a calcification near the left distal ureter on image 75 of series 2, I suspect that this is outside of the ureter.  Urinary bladder unremarkable.  Trace free pelvic fluid may be physiologic.  Bilateral tubal ligation clips noted. The uterus and adnexa appear unremarkable.  Terminal ileum unremarkable.  Appendix normal.  No pathologic retroperitoneal or porta hepatis adenopathy is identified.  No pathologic pelvic adenopathy is identified.  There is sclerosis along both sacroiliac joints compatible with symmetric bilateral sacroiliitis.  The stomach is moderately distended with contrast.  There is a small amount of contrast in the proximal small bowel.  IMPRESSION:  1.  Mild symmetric bilateral sacroiliitis. 2.  Small hypodensities in the right kidney are likely tiny cysts but technically too small to characterize. 3.  Trace free pelvic fluid, likely physiologic. 4.  Subtle 4 mm hypodensity in segment 7 of the liver, likely a cyst. 5.  The stomach is distended with contrast, and there is only a small amount of contrast in the proximal small bowel.  This may be an indicator of sluggish gastric outflow or mild ileus.   Original Report Authenticated By: Gaylyn Rong, M.D.      No diagnosis found.  I reviewed yesterday's notes, including my evaluation, CT results, lab results.   9:00 PM On repeat exam the patient was sleeping.   MDM  This young female presents for second time in 2 days with ongoing upper  abdominal pain.  Notably, I evaluated this patient as well yesterday.  That evaluation included labs, CT study.  That presentation, and today's is most consistent with esophageal or gastric irritation.  The patient's labs are essentially the same, with decreased leukocytosis.  The absence of any fever throughout, as well as the absence of distress, the patient's sleeping status on repeat exam are all reassuring.  The initiation of medication history included antacid therapy which typically takes greater than one day for efficacy.  The patient was again counseled on return precautions, follow up instructions, discharged in stable condition.      Gerhard Munch, MD 01/25/13 2101

## 2013-01-30 ENCOUNTER — Encounter (HOSPITAL_COMMUNITY): Payer: Self-pay | Admitting: Emergency Medicine

## 2013-01-30 ENCOUNTER — Inpatient Hospital Stay (HOSPITAL_COMMUNITY)
Admission: EM | Admit: 2013-01-30 | Discharge: 2013-02-07 | DRG: 885 | Disposition: A | Discharge status: Home / Self Care | Payer: MEDICAID | Source: Intra-hospital | Attending: Psychiatry | Admitting: Psychiatry

## 2013-01-30 ENCOUNTER — Encounter (HOSPITAL_COMMUNITY): Payer: Self-pay

## 2013-01-30 ENCOUNTER — Emergency Department (HOSPITAL_COMMUNITY)
Admission: EM | Admit: 2013-01-30 | Discharge: 2013-01-30 | Disposition: A | Discharge status: Other Acute Inpatient Hospital | Payer: MEDICAID | Source: Home / Self Care | Attending: Emergency Medicine | Admitting: Emergency Medicine

## 2013-01-30 DIAGNOSIS — F411 Generalized anxiety disorder: Secondary | ICD-10-CM | POA: Insufficient documentation

## 2013-01-30 DIAGNOSIS — K219 Gastro-esophageal reflux disease without esophagitis: Secondary | ICD-10-CM | POA: Insufficient documentation

## 2013-01-30 DIAGNOSIS — Z87891 Personal history of nicotine dependence: Secondary | ICD-10-CM | POA: Insufficient documentation

## 2013-01-30 DIAGNOSIS — F39 Unspecified mood [affective] disorder: Secondary | ICD-10-CM | POA: Insufficient documentation

## 2013-01-30 DIAGNOSIS — Z79899 Other long term (current) drug therapy: Secondary | ICD-10-CM

## 2013-01-30 DIAGNOSIS — F32A Depression, unspecified: Secondary | ICD-10-CM

## 2013-01-30 DIAGNOSIS — F329 Major depressive disorder, single episode, unspecified: Secondary | ICD-10-CM | POA: Insufficient documentation

## 2013-01-30 DIAGNOSIS — F3289 Other specified depressive episodes: Secondary | ICD-10-CM | POA: Insufficient documentation

## 2013-01-30 DIAGNOSIS — R45851 Suicidal ideations: Secondary | ICD-10-CM

## 2013-01-30 DIAGNOSIS — Z3202 Encounter for pregnancy test, result negative: Secondary | ICD-10-CM | POA: Insufficient documentation

## 2013-01-30 DIAGNOSIS — F332 Major depressive disorder, recurrent severe without psychotic features: Principal | ICD-10-CM | POA: Diagnosis present

## 2013-01-30 DIAGNOSIS — F331 Major depressive disorder, recurrent, moderate: Secondary | ICD-10-CM

## 2013-01-30 HISTORY — DX: Depression, unspecified: F32.A

## 2013-01-30 HISTORY — DX: Major depressive disorder, single episode, unspecified: F32.9

## 2013-01-30 HISTORY — DX: Gastro-esophageal reflux disease without esophagitis: K21.9

## 2013-01-30 LAB — CBC WITH DIFFERENTIAL/PLATELET
Basophils Absolute: 0 10*3/uL (ref 0.0–0.1)
Eosinophils Relative: 2 % (ref 0–5)
HCT: 41.7 % (ref 36.0–46.0)
Lymphocytes Relative: 29 % (ref 12–46)
Lymphs Abs: 2.7 10*3/uL (ref 0.7–4.0)
MCV: 84.4 fL (ref 78.0–100.0)
Monocytes Absolute: 0.6 10*3/uL (ref 0.1–1.0)
Neutro Abs: 5.7 10*3/uL (ref 1.7–7.7)
Platelets: 378 10*3/uL (ref 150–400)
RBC: 4.94 MIL/uL (ref 3.87–5.11)
WBC: 9.1 10*3/uL (ref 4.0–10.5)

## 2013-01-30 LAB — ETHANOL: Alcohol, Ethyl (B): <11 mg/dL (ref 0–11)

## 2013-01-30 LAB — BASIC METABOLIC PANEL
CO2: 24 mEq/L (ref 19–32)
Calcium: 9.5 mg/dL (ref 8.4–10.5)
Chloride: 102 mEq/L (ref 96–112)
Glucose, Bld: 95 mg/dL (ref 70–99)
Sodium: 136 mEq/L (ref 135–145)

## 2013-01-30 LAB — PREGNANCY, URINE: Preg Test, Ur: NEGATIVE

## 2013-01-30 LAB — RAPID URINE DRUG SCREEN, HOSP PERFORMED
Amphetamines: NOT DETECTED
Benzodiazepines: NOT DETECTED
Cocaine: POSITIVE — AB

## 2013-01-30 MED ORDER — CHLORDIAZEPOXIDE HCL 25 MG PO CAPS
25.0000 mg | ORAL_CAPSULE | Freq: Three times a day (TID) | ORAL | Status: AC
  Administered 2013-02-01 – 2013-02-02 (×3): 25 mg via ORAL
  Filled 2013-01-30 (×3): qty 1

## 2013-01-30 MED ORDER — CHLORDIAZEPOXIDE HCL 25 MG PO CAPS
25.0000 mg | ORAL_CAPSULE | ORAL | Status: AC
  Administered 2013-02-03: 25 mg via ORAL
  Filled 2013-01-30 (×2): qty 1

## 2013-01-30 MED ORDER — CHLORDIAZEPOXIDE HCL 25 MG PO CAPS
25.0000 mg | ORAL_CAPSULE | Freq: Four times a day (QID) | ORAL | Status: AC
  Administered 2013-01-31 – 2013-02-01 (×5): 25 mg via ORAL
  Filled 2013-01-30 (×5): qty 1

## 2013-01-30 MED ORDER — ACETAMINOPHEN 325 MG PO TABS
650.0000 mg | ORAL_TABLET | Freq: Four times a day (QID) | ORAL | Status: DC | PRN
  Administered 2013-02-02 – 2013-02-05 (×4): 650 mg via ORAL

## 2013-01-30 MED ORDER — VITAMIN B-1 100 MG PO TABS
100.0000 mg | ORAL_TABLET | Freq: Every day | ORAL | Status: DC
  Administered 2013-01-31 – 2013-02-07 (×8): 100 mg via ORAL
  Filled 2013-01-30 (×10): qty 1

## 2013-01-30 MED ORDER — CHLORDIAZEPOXIDE HCL 25 MG PO CAPS
25.0000 mg | ORAL_CAPSULE | Freq: Every day | ORAL | Status: AC
  Administered 2013-02-04: 25 mg via ORAL
  Filled 2013-01-30: qty 1

## 2013-01-30 MED ORDER — LOPERAMIDE HCL 2 MG PO CAPS
2.0000 mg | ORAL_CAPSULE | ORAL | Status: AC | PRN
  Administered 2013-02-01: 4 mg via ORAL

## 2013-01-30 MED ORDER — ACETAMINOPHEN 325 MG PO TABS: 650.0000 mg | ORAL_TABLET | ORAL | Status: DC | PRN

## 2013-01-30 MED ORDER — HYDROXYZINE HCL 25 MG PO TABS
25.0000 mg | ORAL_TABLET | Freq: Four times a day (QID) | ORAL | Status: AC | PRN
  Administered 2013-02-01: 25 mg via ORAL

## 2013-01-30 MED ORDER — MAGNESIUM HYDROXIDE 400 MG/5ML PO SUSP: 30.0000 mL | Freq: Every day | ORAL | Status: DC | PRN

## 2013-01-30 MED ORDER — THIAMINE HCL 100 MG/ML IJ SOLN: 100.0000 mg | Freq: Once | INTRAMUSCULAR | Status: DC

## 2013-01-30 MED ORDER — LORAZEPAM 1 MG PO TABS: 1.0000 mg | ORAL_TABLET | Freq: Three times a day (TID) | ORAL | Status: DC | PRN

## 2013-01-30 MED ORDER — ADULT MULTIVITAMIN W/MINERALS CH
1.0000 | ORAL_TABLET | Freq: Every day | ORAL | Status: DC
  Administered 2013-01-30 – 2013-02-07 (×9): 1 via ORAL
  Filled 2013-01-30 (×11): qty 1

## 2013-01-30 MED ORDER — CHLORDIAZEPOXIDE HCL 25 MG PO CAPS
50.0000 mg | ORAL_CAPSULE | Freq: Once | ORAL | Status: AC
  Administered 2013-01-30: 50 mg via ORAL
  Filled 2013-01-30: qty 2

## 2013-01-30 MED ORDER — IBUPROFEN 600 MG PO TABS
600.0000 mg | ORAL_TABLET | Freq: Three times a day (TID) | ORAL | Status: DC | PRN
  Administered 2013-02-02 – 2013-02-07 (×8): 600 mg via ORAL
  Filled 2013-01-30 (×8): qty 1

## 2013-01-30 MED ORDER — CHLORDIAZEPOXIDE HCL 25 MG PO CAPS: 25.0000 mg | ORAL_CAPSULE | Freq: Four times a day (QID) | ORAL | Status: AC | PRN

## 2013-01-30 MED ORDER — IBUPROFEN 400 MG PO TABS: 600.0000 mg | ORAL_TABLET | Freq: Three times a day (TID) | ORAL | Status: DC | PRN

## 2013-01-30 MED ORDER — ALUM & MAG HYDROXIDE-SIMETH 200-200-20 MG/5ML PO SUSP
30.0000 mL | ORAL | Status: DC | PRN
  Administered 2013-02-01: 30 mL via ORAL

## 2013-01-30 MED ORDER — ONDANSETRON 4 MG PO TBDP: 4.0000 mg | ORAL_TABLET | Freq: Four times a day (QID) | ORAL | Status: AC | PRN

## 2013-01-30 NOTE — ED Notes (Signed)
Diane from ACT in to talk with pt.

## 2013-01-30 NOTE — BH Assessment (Signed)
Assessment Note   Laura Lopez is an 27 y.o. female.   Increasing depression for the past month with feelings of not wanting to live. She said that if "I go back out there I will cut myself to die". She lost a job in November 2013 and while she has another job she cannot afford things that she and her 3 children need. Right now the children are with their grandmother. They have been living with a friend and pt (and her children) can return there. Pt is dependent on others to take her to work or any other place that she needs to go.  In addition to loosing her job she experienced a break up with a boyfriend and she said, "I lost everything in November and there is no reason that I should be here". She is not able to contract for safety.   Axis I: Depressive Disorder NOS Axis II: Deferred Axis III:  Past Medical History  Diagnosis Date  . Depression   . GERD (gastroesophageal reflux disease)    Axis IV: economic problems, occupational problems, other psychosocial or environmental problems, problems related to social environment, problems with access to health care services and problems with primary support group Axis V: 31-40 impairment in reality testing  Past Medical History:  Past Medical History  Diagnosis Date  . Depression   . GERD (gastroesophageal reflux disease)     Past Surgical History  Procedure Laterality Date  . Tubal ligation    . Tonsillectomy      Family History: History reviewed. No pertinent family history.  Social History:  reports that she has quit smoking. She does not have any smokeless tobacco history on file. She reports that  drinks alcohol. She reports that she does not use illicit drugs.  Additional Social History:     CIWA: CIWA-Ar BP: 133/79 mmHg Pulse Rate: 109 COWS:    Allergies: No Known Allergies  Home Medications:  (Not in a hospital admission)  OB/GYN Status:  Patient's last menstrual period was 01/10/2013.  General Assessment  Data Location of Assessment: [facility] Assessment: Yes Living Arrangements: Non-relatives/Friends Can pt return to current living arrangement?: Yes Admission Status: Voluntary Is patient capable of signing voluntary admission?: Yes Transfer from: Home Referral Source: MD  Education Status Is patient currently in school?: No  Risk to self Suicidal Ideation: Yes-Currently Present Suicidal Intent: Yes-Currently Present Is patient at risk for suicide?: Yes Suicidal Plan?: Yes-Currently Present Specify Current Suicidal Plan: cut her arms Access to Means: Yes Specify Access to Suicidal Means: knife What has been your use of drugs/alcohol within the last 12 months?: denies Previous Attempts/Gestures: No Intentional Self Injurious Behavior: None Family Suicide History: No Recent stressful life event(s): Loss (Comment) (job and relationship) Persecutory voices/beliefs?: No Depression: Yes Depression Symptoms: Despondent Substance abuse history and/or treatment for substance abuse?: No Suicide prevention information given to non-admitted patients: Not applicable  Risk to Others Homicidal Ideation: No Thoughts of Harm to Others: No Current Homicidal Intent: No Current Homicidal Plan: No Access to Homicidal Means: No Assessment of Violence: None Noted Does patient have access to weapons?: No Criminal Charges Pending?: No Does patient have a court date: No  Psychosis Hallucinations: None noted Delusions: None noted  Mental Status Report Appear/Hygiene: Disheveled Eye Contact: Fair Motor Activity: Freedom of movement Speech: Logical/coherent Level of Consciousness: Alert Mood: Depressed Affect: Blunted Anxiety Level: Minimal Thought Processes: Coherent;Relevant Judgement: Impaired Obsessive Compulsive Thoughts/Behaviors: Minimal  Cognitive Functioning Concentration: Decreased Memory: Recent Intact;Remote  Intact IQ: Average Insight: Fair Impulse Control:  Fair Appetite: Fair Sleep: Decreased Total Hours of Sleep: 3 Vegetative Symptoms: None  ADLScreening [facility] Assessment Services) Patient's cognitive ability adequate to safely complete daily activities?: Yes Patient able to express need for assistance with ADLs?: Yes Independently performs ADLs?: Yes (appropriate for developmental age)  Abuse/Neglect [facility]) Physical Abuse: Denies Verbal Abuse: Denies Sexual Abuse: Denies  Prior Inpatient Therapy Prior Inpatient Therapy: No  Prior Outpatient Therapy Prior Outpatient Therapy: No  ADL Screening (condition at time of admission) Patient's cognitive ability adequate to safely complete daily activities?: Yes Patient able to express need for assistance with ADLs?: Yes Independently performs ADLs?: Yes (appropriate for developmental age)       Abuse/Neglect Assessment (Assessment to be complete while patient is alone) Physical Abuse: Denies Verbal Abuse: Denies Sexual Abuse: Denies Values / Beliefs Cultural Requests During Hospitalization: None Spiritual Requests During Hospitalization: None        Additional Information 1:1 In Past 12 Months?: No CIRT Risk: No Elopement Risk: No Does patient have medical clearance?: No     Disposition:  Disposition Initial Assessment Completed for this Encounter: Yes Disposition of Patient: Inpatient treatment program Type of inpatient treatment program: Adult  On Site Evaluation by:   Reviewed with Physician:     Genia Del 01/30/2013 3:04 PM

## 2013-01-30 NOTE — ED Notes (Signed)
Pt states she has been depressed x 1 month. Pt very tearful. Cooperative/pleasant. Pt states this is the first time she ever wanted to "not be here". Pt states she feels like she would hurt/kill self but has no plan. Pt states lost job within last month and "everything is going wrong". Pt in blue scrubs, wanded and aware of protocol/policy and in agreement. Sitter at bedside.

## 2013-01-30 NOTE — Progress Notes (Signed)
Pt is accepted at [facility] by Verne Spurr to Dr Dub Mikes room 300-2.

## 2013-01-30 NOTE — Progress Notes (Signed)
Pt is a 27 year old female admitted with depression and substance abuse although pt does deny any drug use but her UDS was positive for pot cocaine and barbituates    Pt reports feeling very depressed and hopeless and was having suicidal ideation with a plan to cut her wrists   She has multiple tatoos on her body  She has 3 children who stay with her mother   She had recent breakup with her boyfriend    Pt was oriented to the unit and offered nourishment   Admission completed   Verbal support given   Q 15 min checks   Pt safe at present

## 2013-01-30 NOTE — ED Notes (Signed)
Pt awake. Drink given per request. Sitter at bedside.

## 2013-01-30 NOTE — Tx Team (Signed)
Initial Interdisciplinary Treatment Plan  PATIENT STRENGTHS: (choose at least two) Capable of independent living General fund of knowledge Supportive family/friends  PATIENT STRESSORS: Financial difficulties Medication change or noncompliance Substance abuse   PROBLEM LIST: Problem List/Patient Goals Date to be addressed Date deferred Reason deferred Estimated date of resolution  Substance Abuse      Depression with suicidal ideation                                                 DISCHARGE CRITERIA:  Ability to meet basic life and health needs Improved stabilization in mood, thinking, and/or behavior Reduction of life-threatening or endangering symptoms to within safe limits Verbal commitment to aftercare and medication compliance Withdrawal symptoms are absent or subacute and managed without 24-hour nursing intervention  PRELIMINARY DISCHARGE PLAN: Attend aftercare/continuing care group Attend 12-step recovery group Return to previous living arrangement  PATIENT/FAMIILY INVOLVEMENT: This treatment plan has been presented to and reviewed with the patient, CLIFTON KOVACIC, and/or family member, .  The patient and family have been given the opportunity to ask questions and make suggestions.  Andrena Mews 01/30/2013, 8:23 PM

## 2013-01-30 NOTE — ED Notes (Signed)
Patient given a snack of vanilla ice cream.

## 2013-01-30 NOTE — ED Provider Notes (Signed)
History    This chart was scribed for [employer]. Rubin Payor, MD by Marlyne Beards, ED Scribe. The patient was seen in room APA03/APA03. Patient's care was started at 12:30 PM.    CSN: [id number]  Arrival date & time 01/30/13  1154   First MD Initiated Contact with Patient 01/30/13 1230      Chief Complaint  Patient presents with  . Depression    (Consider location/radiation/quality/duration/timing/severity/associated sxs/prior treatment) The history is provided by the patient and a relative. No language interpreter was used.   Laura Lopez is a 27 y.o. female who presents to the Emergency Department complaining of depression that started about a month ago. Pt is currently calm and tearful in the ED.  Pt states today is the first time she has thought of dying due to severity of depression. She states that she feels like she wants to hurt or kill herself but has no plan. Pt states that she lost her job with in the last month and everything has gone wrong since. Pt denies taking any medication and denies hallucinations or any HI. Pt denies fever, chills, cough, nausea, vomiting, diarrhea, SOB, weakness, and any other associated symptoms.   Past Medical History  Diagnosis Date  . Depression   . GERD (gastroesophageal reflux disease)     Past Surgical History  Procedure Laterality Date  . Tubal ligation    . Tonsillectomy      History reviewed. No pertinent family history.  History  Substance Use Topics  . Smoking status: Former Games developer  . Smokeless tobacco: Not on file  . Alcohol Use: Yes     Comment: occ    OB History   Grav Para Term Preterm Abortions TAB SAB Ect Mult Living                  Review of Systems  Psychiatric/Behavioral: Positive for dysphoric mood. The patient is nervous/anxious.   All other systems reviewed and are negative.    Allergies  Review of patient's allergies indicates no known allergies.  Home Medications   Current Outpatient Rx   Name  Route  Sig  Dispense  Refill  . omeprazole (PRILOSEC) 20 MG capsule   Oral   Take 1 capsule (20 mg total) by mouth daily.   20 capsule   0   . sucralfate (CARAFATE) 1 GM/10ML suspension   Oral   Take 10 mLs (1 g total) by mouth 4 (four) times daily.   420 mL   0     BP 133/79  Pulse 109  Temp(Src) 98.7 F (37.1 C) (Oral)  Resp 18  Ht 5\' 6"  (1.676 m)  Wt 206 lb (93.441 kg)  BMI 33.27 kg/m2  SpO2 98%  LMP 01/10/2013  Physical Exam  Nursing note and vitals reviewed. Constitutional: She is oriented to person, place, and time. She appears well-developed and well-nourished. No distress.  HENT:  Head: Normocephalic and atraumatic.  Eyes: EOM are normal.  Neck: Neck supple. No tracheal deviation present.  Cardiovascular: Normal rate.   Pulmonary/Chest: Effort normal. No respiratory distress.  Musculoskeletal: Normal range of motion.  Neurological: She is alert and oriented to person, place, and time.  Skin: Skin is warm and dry.  Psychiatric:  Depressed and tearful.    ED Course  Procedures (including critical care time) DIAGNOSTIC STUDIES: Oxygen Saturation is 98% on room air, normal by my interpretation.    COORDINATION OF CARE: 12:37 PM Discussed ED treatment with pt and pt agrees.  Labs Reviewed  URINE RAPID DRUG SCREEN (HOSP PERFORMED) - Abnormal; Notable for the following:    Cocaine POSITIVE (*)    Tetrahydrocannabinol POSITIVE (*)    Barbiturates POSITIVE (*)    All other components within normal limits  CBC WITH DIFFERENTIAL  BASIC METABOLIC PANEL  PREGNANCY, URINE  ETHANOL   No results found.   1. Depression   2. Suicidal ideation       MDM  Patient with depression and suicidal thoughts. Denies attempt. Patient denies substance abuse, however she is positive for cocaine, marijuana, and barbiturates in her urine. She appears to medically cleared and will be seen by ACT team. I personally performed the services described in this  documentation, which was scribed in my presence. The recorded information has been reviewed and is accurate.          Juliet Rude. Rubin Payor, MD 01/30/13 1427

## 2013-01-30 NOTE — ED Notes (Signed)
Report given to [facility]. Pt aware they are on way.

## 2013-01-30 NOTE — ED Notes (Signed)
[facility] called for transport. Pt aware.

## 2013-01-31 ENCOUNTER — Encounter (HOSPITAL_COMMUNITY): Payer: Self-pay | Admitting: Psychiatry

## 2013-01-31 DIAGNOSIS — F331 Major depressive disorder, recurrent, moderate: Secondary | ICD-10-CM | POA: Diagnosis present

## 2013-01-31 DIAGNOSIS — R45851 Suicidal ideations: Secondary | ICD-10-CM

## 2013-01-31 DIAGNOSIS — F339 Major depressive disorder, recurrent, unspecified: Secondary | ICD-10-CM

## 2013-01-31 MED ORDER — FLUOXETINE HCL 20 MG PO CAPS
20.0000 mg | ORAL_CAPSULE | Freq: Every day | ORAL | Status: DC
  Administered 2013-01-31 – 2013-02-03 (×4): 20 mg via ORAL
  Filled 2013-01-31 (×5): qty 1

## 2013-01-31 MED ORDER — TRAZODONE HCL 50 MG PO TABS
50.0000 mg | ORAL_TABLET | Freq: Every day | ORAL | Status: DC
  Administered 2013-01-31 – 2013-02-06 (×7): 50 mg via ORAL
  Filled 2013-01-31 (×7): qty 1
  Filled 2013-01-31: qty 7
  Filled 2013-01-31: qty 1

## 2013-01-31 NOTE — [facility] Group Notes (Signed)
[facility] LCSW Group Therapy  01/31/2013 1:15 PM  Type of Therapy:  Group Therapy 1:15 to 2:30 PM  Participation Level:  Did Not Attend  Harrill, Catherine Campbell    

## 2013-01-31 NOTE — Tx Team (Signed)
Interdisciplinary Treatment Plan Update (Adult)  Date: 01/31/2013  Time Reviewed: 9:45 AM   Progress in Treatment: Attending groups: Yes Participating in groups: Yes Taking medication as prescribed:  Yes Tolerating medication:  Yes Family/Significant othe contact made: Not as yet Patient understands diagnosis: Yes Discussing patient identified problems/goals with staff: Yes Medical problems stabilized or resolved:  Yes Denies suicidal/homicidal ideation: Yes Patient has not harmed self or Others: Yes  New problem(s) identified: None Identified  Discharge Plan or Barriers:  CSW is assessing for appropriate referrals.   Additional comments: N/A  Reason for Continuation of Hospitalization: Anxiety Depression Medication stabilization Suicidal ideation  Estimated length of stay: 3 days  For review of initial/current patient goals, please see plan of care. Attendees: Patient:     Family:     Physician:  Geoffery Lyons 01/31/2013 9:45 AM   Nursing: Carney Living, RN    01/31/2013 9:45 AM   Clinical Social Worker Ronda Fairly 01/31/2013 9:45 AM   Other:  Dorinda Hill, Sherrie Sport PA Student 01/31/2013 9:45 AM   Other:  Enid Baas, PA 01/31/2013 9:45 AM   Other:  Titus Mould, Community Care Coordinator 01/31/2013 9:45 AM   Other:  Serena Colonel, PA 01/31/2013 9:45 AM    Scribe for Treatment Team:   Carney Bern, LCSWA  01/31/2013 9:45 AM

## 2013-01-31 NOTE — H&P (Signed)
Psychiatric Admission Assessment Adult  Patient Identification:  Laura Lopez  Date of Evaluation:  01/31/2013  Chief Complaint:  Depressivs disorder  History of Present Illness: This is a 27 year old African-American female. Admitted to [facility] from the [facility] in [place], [place] with complaints of increased depression and suicidal ideations with thoughts to cut her wrists. Ms. Salatino reports, "I walked to the [facility] yesterday because I could not take it no more. I have been feeling like the world is on my shoulders. I felt overwhelmed. Then the feelings like I don't want to be here no more followed. I felt like cutting my wrists to die that way. Then I kind of thought, that was not right. I got worried that was when I took myself to the hospital. I lost my job last November. I did find another job last February, but it does not pay much. I lost my car as a result. I have 3 children to support. I am not in any relationship with my baby daddies. I don't talk to any of them. I'm a single mother. I struggle a lot. I feel like I could not give my children what they need. I felt worthless, like I'm no good to no body. To feel a little better, I went out 3 days ago and use some cocaine. That was my first time using. I also smoked some weed about 3 weeks ago. But the cocaine and the weed did not help me much. They made my mood worsen. My depression right now is at #8. I'm still suicidal off and on, no plans or intent".  Elements:  Location:  [facility] adult unit. Quality:  Excessive worrying, sleeplessness, hopelessness, self isolation. Severity:  Rated depression at #8. Timing:  Started last November after I lost my job.. Duration:  "I believe that I have been depressed for a while". Context:  I get easily irritated, sometimes I will get angry, I feel hopless.  Associated Signs/Synptoms:  Depression Symptoms:  depressed mood, Insomnia  (Hypo) Manic Symptoms:  Irritable  Mood,  Anxiety Symptoms:  Excessive Worry,  Psychotic Symptoms:  Hallucinations: None  PTSD Symptoms: Had a traumatic exposure:  Denies  Psychiatric Specialty Exam: Physical Exam  Constitutional: She is oriented to person, place, and time. She appears well-developed.  Obese  HENT:  Head: Normocephalic.  Eyes: Pupils are equal, round, and reactive to light.  Neck: Normal range of motion.  Cardiovascular: Normal rate.   Respiratory: Effort normal.  GI: Soft.  Musculoskeletal: Normal range of motion.  Neurological: She is alert and oriented to person, place, and time.  Skin: Skin is warm and dry.  Psychiatric: Her speech is normal and behavior is normal. Judgment normal. Her affect is not blunt, not labile and not inappropriate. Angry: At times. Thought content is not paranoid and not delusional. Cognition and memory are normal. She exhibits a depressed mood (Rated depression at #8). She expresses suicidal ideation. She expresses no homicidal ideation. She expresses no suicidal plans and no homicidal plans.    Review of Systems  Constitutional: Negative.   HENT: Negative.   Eyes: Negative.   Respiratory: Negative.   Cardiovascular: Negative.   Gastrointestinal: Negative.   Genitourinary: Negative.   Musculoskeletal: Negative.   Skin: Negative.   Neurological: Negative.   Endo/Heme/Allergies: Negative.   Psychiatric/Behavioral: Positive for depression (Rated at #8), suicidal ideas (denies any plans and or intent) and substance abuse (hx THC and cocaine abuse). Negative for hallucinations and memory  loss. The patient has insomnia. The patient is not nervous/anxious.     Blood pressure 122/77, pulse 105, temperature 97.7 F (36.5 C), temperature source Oral, resp. rate 16, height 5\' 6"  (1.676 m), weight 79.833 kg (176 lb), last menstrual period 01/10/2013, SpO2 100.00%.Body mass index is 28.42 kg/(m^2).  General Appearance: Disheveled  Eye Solicitor::  Fair  Speech:  Clear and  Coherent  Volume:  Normal  Mood:  Depressed, Hopeless and rated depression at #8  Affect:  Flat and Tearful  Thought Process:  Coherent and Intact  Orientation:  Full (Time, Place, and Person)  Thought Content:  Rumination and denie any hallucinations  Suicidal Thoughts:  Yes.  without intent/plan  Homicidal Thoughts:  No  Memory:  Immediate;   Good Recent;   Good Remote;   Good  Judgement:  Fair  Insight:  Fair  Psychomotor Activity:  Normal  Concentration:  Fair  Recall:  Good  Akathisia:  No  Handed:  Right  AIMS (if indicated):     Assets:  Desire for Improvement  Sleep:  Number of Hours: 6    Past Psychiatric History: Diagnosis: Major depressive disorder, recurrent episodes, Suicide ideations.  Hospitalizations: [facility]  Outpatient Care: None  Substance Abuse Care: None reported  Self-Mutilation: Denies   Suicidal Attempts: Denies attempts, admits thoughts.  Violent Behaviors: None reported   Past Medical History:   Past Medical History  Diagnosis Date  . Depression   . GERD (gastroesophageal reflux disease)    None.  Allergies:  No Known Allergies  PTA Medications: Prescriptions prior to admission  Medication Sig Dispense Refill  . omeprazole (PRILOSEC) 20 MG capsule Take 1 capsule (20 mg total) by mouth daily.  20 capsule  0  . sucralfate (CARAFATE) 1 GM/10ML suspension Take 10 mLs (1 g total) by mouth 4 (four) times daily.  420 mL  0    Previous Psychotropic Medications:  Medication/Dose  See medication lists               Substance Abuse History in the last 12 months:  yes  Consequences of Substance Abuse: Medical Consequences:  Liver damage, Possible death by overdose Legal Consequences:  Arrests, jail time, Loss of driving privilege. Family Consequences:  Family discord, divorce and or separation.  Social History:  reports that she has quit smoking. She does not have any smokeless tobacco history on file. She reports that  drinks alcohol.  She reports that she does not use illicit drugs. Additional Social History: History of alcohol / drug use?: Yes Longest period of sobriety (when/how long): pt denies use  Current Place of Residence: [place], [place]   Place of Birth: [place], [place]  Family Members: "My 3 children"  Marital Status:  Single  Children: 3  Sons: 0  Daughters: 3  Relationships: Single  Education:  [employer] planner Problems/Performance: Completed high school.  Religious Beliefs/Practices: NA  History of Abuse (Emotional/Phsycial/Sexual): Denies  Occupational Experiences: Employed  Military History:  None.  Legal History: None reported  Hobbies/Interests: None reported  Family History:  No family history on file.  Results for orders placed during the hospital encounter of 01/30/13 (from the past 72 hour(s))  CBC WITH DIFFERENTIAL     Status: None   Collection Time    01/30/13 12:13 PM      Result Value Range   WBC 9.1  4.0 - 10.5 K/uL   RBC 4.94  3.87 - 5.11 MIL/uL   Hemoglobin 14.9  12.0 - 15.0  g/dL   HCT 16.1  09.6 - 04.5 %   MCV 84.4  78.0 - 100.0 fL   MCH 30.2  26.0 - 34.0 pg   MCHC 35.7  30.0 - 36.0 g/dL   RDW 40.9  81.1 - 91.4 %   Platelets 378  150 - 400 K/uL   Neutrophils Relative 63  43 - 77 %   Neutro Abs 5.7  1.7 - 7.7 K/uL   Lymphocytes Relative 29  12 - 46 %   Lymphs Abs 2.7  0.7 - 4.0 K/uL   Monocytes Relative 6  3 - 12 %   Monocytes Absolute 0.6  0.1 - 1.0 K/uL   Eosinophils Relative 2  0 - 5 %   Eosinophils Absolute 0.1  0.0 - 0.7 K/uL   Basophils Relative 0  0 - 1 %   Basophils Absolute 0.0  0.0 - 0.1 K/uL  BASIC METABOLIC PANEL     Status: None   Collection Time    01/30/13 12:13 PM      Result Value Range   Sodium 136  135 - 145 mEq/L   Potassium 3.7  3.5 - 5.1 mEq/L   Chloride 102  96 - 112 mEq/L   CO2 24  19 - 32 mEq/L   Glucose, Bld 95  70 - 99 mg/dL   BUN 9  6 - 23 mg/dL   Creatinine, Ser 7.82  0.50 - 1.10 mg/dL   Calcium 9.5  8.4 - 95.6  mg/dL   GFR calc non Af Amer >90  >90 mL/min   GFR calc Af Amer >90  >90 mL/min   Comment:            The eGFR has been calculated     using the CKD EPI equation.     This calculation has not been     validated in all clinical     situations.     eGFR's persistently     <90 mL/min signify     possible Chronic Kidney Disease.  ETHANOL     Status: None   Collection Time    01/30/13 12:13 PM      Result Value Range   Alcohol, Ethyl (B) <11  0 - 11 mg/dL   Comment:            LOWEST DETECTABLE LIMIT FOR     SERUM ALCOHOL IS 11 mg/dL     FOR MEDICAL PURPOSES ONLY  PREGNANCY, URINE     Status: None   Collection Time    01/30/13 12:37 PM      Result Value Range   Preg Test, Ur NEGATIVE  NEGATIVE   Comment:            THE SENSITIVITY OF THIS     METHODOLOGY IS >20 mIU/mL.  URINE RAPID DRUG SCREEN (HOSP PERFORMED)     Status: Abnormal   Collection Time    01/30/13 12:37 PM      Result Value Range   Opiates NONE DETECTED  NONE DETECTED   Cocaine POSITIVE (*) NONE DETECTED   Benzodiazepines NONE DETECTED  NONE DETECTED   Amphetamines NONE DETECTED  NONE DETECTED   Tetrahydrocannabinol POSITIVE (*) NONE DETECTED   Barbiturates POSITIVE (*) NONE DETECTED   Comment:            DRUG SCREEN FOR MEDICAL PURPOSES     ONLY.  IF CONFIRMATION IS NEEDED     FOR ANY PURPOSE, NOTIFY LAB  WITHIN 5 DAYS.                LOWEST DETECTABLE LIMITS     FOR URINE DRUG SCREEN     Drug Class       Cutoff (ng/mL)     Amphetamine      1000     Barbiturate      200     Benzodiazepine   200     Tricyclics       300     Opiates          300     Cocaine          300     THC              50   Psychological Evaluations:  Assessment:   AXIS I:  Major depressive disorder, recurrent episode, Suicidal ideations AXIS II:  Deferred AXIS III:   Past Medical History  Diagnosis Date  . Depression   . GERD (gastroesophageal reflux disease)    AXIS IV:  economic problems and occupational  problems AXIS V:  11-20 some danger of hurting self or others possible OR occasionally fails to maintain minimal personal hygiene OR gross impairment in communication  Treatment Plan/Recommendations: 1. Admit for crisis management and stabilization, estimated length of stay 3-5 days.  2. Medication management to reduce current symptoms to base line and improve the patient's overall level of functioning: (a). Prozac 20 mg daily for depression.                                 (b). Trazodone 50 mg Q bedtime for sleep. 3. Treat health problems as indicated. 4. Develop treatment plan to decrease risk of relapse upon discharge and the need for readmission.  5. Psycho-social education regarding relapse prevention and self care.  6. Health care follow up as needed for medical problems.  7. Review, reconcile, and reinstate any pertinent home medications for other health issues where appropriate. 8. Call for consults with hospitalist for any additional specialty patient care services as needed.  Treatment Plan Summary: Daily contact with patient to assess and evaluate symptoms and progress in treatment Medication management Supportive approach/coping skills/assess need for detox/reassess and address the co morbidities Current Medications:  Current Facility-Administered Medications  Medication Dose Route Frequency Provider Last Rate Last Dose  . acetaminophen (TYLENOL) tablet 650 mg  650 mg Oral Q6H PRN Verne Spurr, PA-C      . alum & mag hydroxide-simeth (MAALOX/MYLANTA) 200-200-20 MG/5ML suspension 30 mL  30 mL Oral Q4H PRN Verne Spurr, PA-C      . chlordiazePOXIDE (LIBRIUM) capsule 25 mg  25 mg Oral Q6H PRN Verne Spurr, PA-C      . chlordiazePOXIDE (LIBRIUM) capsule 25 mg  25 mg Oral QID Verne Spurr, PA-C   25 mg at 01/31/13 0752   Followed by  . [START ON 02/01/2013] chlordiazePOXIDE (LIBRIUM) capsule 25 mg  25 mg Oral TID Verne Spurr, PA-C       Followed by  . [START ON 02/02/2013]  chlordiazePOXIDE (LIBRIUM) capsule 25 mg  25 mg Oral BH-qamhs Verne Spurr, PA-C       Followed by  . [START ON 02/04/2013] chlordiazePOXIDE (LIBRIUM) capsule 25 mg  25 mg Oral Daily Verne Spurr, PA-C      . hydrOXYzine (ATARAX/VISTARIL) tablet 25 mg  25 mg Oral Q6H PRN Verne Spurr, PA-C      .  ibuprofen (ADVIL,MOTRIN) tablet 600 mg  600 mg Oral Q8H PRN Verne Spurr, PA-C      . loperamide (IMODIUM) capsule 2-4 mg  2-4 mg Oral PRN Verne Spurr, PA-C      . magnesium hydroxide (MILK OF MAGNESIA) suspension 30 mL  30 mL Oral Daily PRN Verne Spurr, PA-C      . multivitamin with minerals tablet 1 tablet  1 tablet Oral Daily Verne Spurr, PA-C   1 tablet at 01/31/13 0752  . ondansetron (ZOFRAN-ODT) disintegrating tablet 4 mg  4 mg Oral Q6H PRN Verne Spurr, PA-C      . thiamine (B-1) injection 100 mg  100 mg Intramuscular Once [employer], PA-C      . thiamine (VITAMIN B-1) tablet 100 mg  100 mg Oral Daily Verne Spurr, PA-C   100 mg at 01/31/13 1610    Observation Level/Precautions:  15 minute checks  Laboratory:  Reviewed ED lab findings on file  Psychotherapy: Group sessions   Medications:  See medication lists  Consultations:  As needed  Discharge Concerns: Safety and sobriety    Estimated LOS: 3-5 days  Other:     I certify that inpatient services furnished can reasonably be expected to improve the patient's condition.   Armandina Stammer I 5/2/20148:52 AM

## 2013-01-31 NOTE — [facility] Group Notes (Signed)
[facility] LCSW Aftercare Discharge Planning Group Note   01/31/2013 8:45 AM  Participation Quality:  Appropriate  Mood/Affect:  Appropriate  Depression Rating:  8  Anxiety Rating:  7  Thoughts of Suicide:  Yes, off and on, last thoughts were prior to admission Will you contract for safety?   Yes  Current AVH:  No  Plan for Discharge/Comments:  Wants to return home willing to consider medication management and therapy  Transportation Means: Friend  Supports: Family and friends  Dyane Dustman, Julious Payer

## 2013-01-31 NOTE — Progress Notes (Signed)
Patient ID: Laura Lopez, female   DOB: 07-03-1986, 27 y.o.   MRN: [id number] D: Patient presented with depressed mood and flat affect. Pt is visible on unit interacting with staff and peers. Pt denies SI/HI/AVH and pain. Pt attended evening wrap up group. Pt denies any needs or concerns.  Cooperative with assessment. No acute distressed noted at this time.   A: Met with pt 1:1. Medications administered as prescribed. Writer encouraged pt to discuss feelings. Pt encouraged to come to staff with any question or concerns. 15 minutes checks for safety.  R: Patient remains safe. She is complaint with medications and denies any adverse reaction. Continue current POC.

## 2013-01-31 NOTE — [facility] Suicide Risk Assessment (Signed)
Suicide Risk Assessment  Admission Assessment     Nursing information obtained from:    Demographic factors:    Current Mental Status:    Loss Factors:    Historical Factors:    Risk Reduction Factors:     CLINICAL FACTORS:   Depression:   Comorbid alcohol abuse/dependence Alcohol/Substance Abuse/Dependencies  COGNITIVE FEATURES THAT CONTRIBUTE TO RISK: No evidence    SUICIDE RISK:   Moderate:  Frequent suicidal ideation with limited intensity, and duration, some specificity in terms of plans, no associated intent, good self-control, limited dysphoria/symptomatology, some risk factors present, and identifiable protective factors, including available and accessible social support.  PLAN OF CARE: Supportive approach/coping skills/relapse prevention                               Reassess co morbidities, optimize treatment  I certify that inpatient services furnished can reasonably be expected to improve the patient's condition.  Jocilynn Grade A 01/31/2013, 1:27 PM

## 2013-01-31 NOTE — Progress Notes (Signed)
Patient ID: Laura Lopez, female   DOB: [birth year], 27 y.o.   MRN: [id number] D: Took over patient's care @ 2330. Patient in bed sleeping. Respiration regular and unlabored. No sign of distress noted at this time  A: 15 mins checks for safety.  R: Patient remains asleep. Pt is safe.

## 2013-01-31 NOTE — Progress Notes (Signed)
Chaplain saw pt while rounding on 300 hall.   Provided spiritual support around admission, pt's desire to be present for children, pt's feeling of isolation from the church and desire to find spiritual community so that she is not carrying all burdens herself.   Pt spoke with chaplain about history of isolation, recent breakup with s/o, courage to come to [facility] and [facility].  Pt requested bible, chaplain provided.  Prayed with pt.   Will continue to follow for support.

## 2013-01-31 NOTE — Progress Notes (Signed)
D: Patient denies HI and A/V hallucinations and on and off thoughts of SI but patient has no plan; patient reports request medication for sleep; reports appetite is good ; reports energy level is low ; reports ability to pay attention is good; rates depression as 9/10; rates hopelessness 7/10; rates anxiety as 8/10;   A: Monitored q 15 minutes; patient encouraged to attend groups; patient educated about medications; patient given medications per physician orders; patient encouraged to express feelings and/or concerns  R: Patient is sad and depressed  ; patient's interaction with staff and peers is appropriate; ; patient is taking medications as prescribed and tolerating medications; patient is attending all groups and engaging in the group

## 2013-02-01 DIAGNOSIS — F332 Major depressive disorder, recurrent severe without psychotic features: Principal | ICD-10-CM

## 2013-02-01 DIAGNOSIS — F411 Generalized anxiety disorder: Secondary | ICD-10-CM

## 2013-02-01 MED ORDER — PANTOPRAZOLE SODIUM 40 MG PO TBEC
40.0000 mg | DELAYED_RELEASE_TABLET | Freq: Every day | ORAL | Status: DC
  Administered 2013-02-01 – 2013-02-07 (×7): 40 mg via ORAL
  Filled 2013-02-01 (×11): qty 1

## 2013-02-01 NOTE — Progress Notes (Signed)
[facility] MD Progress Note  02/01/2013 4:16 PM Laura Lopez  MRN:  [id number] Subjective:  8/10 depression with suicidal ideations, 9/10 anxiety, "one minute I am fine, the next minute I am angry, I don't like feeling like that."  Appetite and sleep good.  Prior to admission she had not eaten in two days.  Cocaine used two weeks ago but positive for phenobarbital.  Little to no support at home with three children, upset today because it is her 27 yo birthday and they are having a party.  Baby's grandmother is supportive with the children but the dad doesn't do anything. Diagnosis:   Axis I: Anxiety Disorder NOS and Major Depression, Recurrent severe Axis II: Deferred Axis III:  Past Medical History  Diagnosis Date  . Depression   . GERD (gastroesophageal reflux disease)    Axis IV: other psychosocial or environmental problems, problems related to social environment and problems with primary support group Axis V: 41-50 serious symptoms  ADL's:  Intact  Sleep: Good  Appetite:  Good  Suicidal Ideation:  Plan:  None Intent:  None Means:  None Homicidal Ideation:  Denies  Psychiatric Specialty Exam: Review of Systems  Constitutional: Negative.   HENT: Negative.   Eyes: Negative.   Respiratory: Negative.   Cardiovascular: Negative.   Gastrointestinal: Negative.   Genitourinary: Negative.   Musculoskeletal: Negative.   Skin: Negative.   Neurological: Negative.   Endo/Heme/Allergies: Negative.   Psychiatric/Behavioral: Positive for depression and suicidal ideas. The patient is nervous/anxious.     Blood pressure 110/75, pulse 93, temperature 97.6 F (36.4 C), temperature source Oral, resp. rate 18, height 5\' 6"  (1.676 m), weight 79.833 kg (176 lb), last menstrual period 01/10/2013, SpO2 100.00%.Body mass index is 28.42 kg/(m^2).  General Appearance: Casual  Eye Contact::  Fair  Speech:  Normal Rate  Volume:  Normal  Mood:  Anxious and Depressed  Affect:  Congruent  Thought  Process:  Coherent  Orientation:  Full (Time, Place, and Person)  Thought Content:  WDL  Suicidal Thoughts:  Yes.  without intent/plan  Homicidal Thoughts:  No  Memory:  Immediate;   Fair Recent;   Fair Remote;   Fair  Judgement:  Fair  Insight:  Fair  Psychomotor Activity:  Decreased  Concentration:  Fair  Recall:  Fair  Akathisia:  No  Handed:  Right  AIMS (if indicated):     Assets:  Communication Skills Resilience Social Support  Sleep:  Number of Hours: 6.5   Current Medications: Current Facility-Administered Medications  Medication Dose Route Frequency Provider Last Rate Last Dose  . acetaminophen (TYLENOL) tablet 650 mg  650 mg Oral Q6H PRN Verne Spurr, PA-C      . alum & mag hydroxide-simeth (MAALOX/MYLANTA) 200-200-20 MG/5ML suspension 30 mL  30 mL Oral Q4H PRN Verne Spurr, PA-C   30 mL at 02/01/13 0845  . chlordiazePOXIDE (LIBRIUM) capsule 25 mg  25 mg Oral Q6H PRN Verne Spurr, PA-C      . chlordiazePOXIDE (LIBRIUM) capsule 25 mg  25 mg Oral TID Verne Spurr, PA-C   25 mg at 02/01/13 1205   Followed by  . [START ON 02/02/2013] chlordiazePOXIDE (LIBRIUM) capsule 25 mg  25 mg Oral BH-qamhs Verne Spurr, PA-C       Followed by  . [START ON 02/04/2013] chlordiazePOXIDE (LIBRIUM) capsule 25 mg  25 mg Oral Daily Verne Spurr, PA-C      . FLUoxetine (PROZAC) capsule 20 mg  20 mg Oral Daily Sanjuana Kava, NP  20 mg at 02/01/13 0843  . hydrOXYzine (ATARAX/VISTARIL) tablet 25 mg  25 mg Oral Q6H PRN Verne Spurr, PA-C      . ibuprofen (ADVIL,MOTRIN) tablet 600 mg  600 mg Oral Q8H PRN Verne Spurr, PA-C      . loperamide (IMODIUM) capsule 2-4 mg  2-4 mg Oral PRN Verne Spurr, PA-C      . magnesium hydroxide (MILK OF MAGNESIA) suspension 30 mL  30 mL Oral Daily PRN Verne Spurr, PA-C      . multivitamin with minerals tablet 1 tablet  1 tablet Oral Daily Verne Spurr, PA-C   1 tablet at 02/01/13 0843  . ondansetron (ZOFRAN-ODT) disintegrating tablet 4 mg  4 mg Oral Q6H  PRN Verne Spurr, PA-C      . thiamine (B-1) injection 100 mg  100 mg Intramuscular Once [employer], PA-C      . thiamine (VITAMIN B-1) tablet 100 mg  100 mg Oral Daily Verne Spurr, PA-C   100 mg at 02/01/13 0843  . traZODone (DESYREL) tablet 50 mg  50 mg Oral QHS Sanjuana Kava, NP   50 mg at 01/31/13 2210    Lab Results: No results found for this or any previous visit (from the past 48 hour(s)).  Physical Findings: AIMS: Facial and Oral Movements Muscles of Facial Expression: None, normal Lips and Perioral Area: None, normal Jaw: None, normal Tongue: None, normal,Extremity Movements Upper (arms, wrists, hands, fingers): None, normal Lower (legs, knees, ankles, toes): None, normal, Trunk Movements Neck, shoulders, hips: None, normal, Overall Severity Severity of abnormal movements (highest score from questions above): None, normal Incapacitation due to abnormal movements: None, normal Patient's awareness of abnormal movements (rate only patient's report): No Awareness, Dental Status Current problems with teeth and/or dentures?: No Does patient usually wear dentures?: No  CIWA:  CIWA-Ar Total: 0 COWS:     Treatment Plan Summary: Daily contact with patient to assess and evaluate symptoms and progress in treatment Medication management  Plan:  Review of chart, vital signs, medications, and notes. 1-Individual and group therapy 2-Medication management for depression and anxiety:  Medications reviewed with the patient and she stated no untoward effects 3-Coping skills for depression and anxiety 4-Continue crisis stabilization and management 5-Address health issues--monitoring vital signs, stable 6-Treatment plan in progress to prevent relapse of depression and anxiety  Medical Decision Making Problem Points:  Established problem, stable/improving (1) and Review of psycho-social stressors (1) Data Points:  Review of medication regiment & side effects (2)  I certify that  inpatient services furnished can reasonably be expected to improve the patient's condition.   Nanine Means, PMH-NP 02/01/2013, 4:16 PM

## 2013-02-01 NOTE — [facility] Group Notes (Signed)
[facility] Group Notes:  (Clinical Social Work)  02/01/2013   3:00-4:00PM  Summary of Progress/Problems:   The main focus of today's process group was for the patient to identify something in their life that led to their hospitalization that they would like to change, then to discuss their motivation to change.  The Stages of Change were explained to the group, then each patient identified where they are in that process.  Scale was used with motivational interviewing to determine the patient's current motivation to change, with 1 being total lack of motivation and 10 being total commitment to change.  The patient expressed that she wants to change being a people pleaser, that she is depressed and that just makes it worse because she supports and helps others and they do not do it for her when needed.  She needs to focus on herself and her 3 children now.  She stated she is in preparation stage, and right now she is listening to other people in the hospital and finding out she has a lot in common.  Her motivation to change is 10 out of 10.  Type of Therapy:  Process Group  Participation Level:  Active  Participation Quality:  Attentive and Sharing  Affect:  Blunted and Depressed  Cognitive:  Appropriate and Oriented  Insight:  Engaged  Engagement in Therapy:  Engaged  Modes of Intervention:  Clarification, Support and Processing, Exploration, Discussion   Ambrose Mantle, LCSW 02/01/2013, 4:52 PM

## 2013-02-01 NOTE — Progress Notes (Signed)
[facility] Group Notes:  (Nursing/MHT/Case Management/Adjunct)  Date:  02/01/2013  Time:  11:09 PM  Type of Therapy:  Group Therapy  Participation Level:  Active  Participation Quality:  Appropriate  Affect:  Appropriate  Cognitive:  Appropriate  Insight:  Appropriate  Engagement in Group:  Improving  Modes of Intervention:  Support  Summary of Progress/Problems:She is feeling better now  Octavio Manns 02/01/2013, 11:09 PM
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
## 2013-02-01 NOTE — Progress Notes (Signed)
 .  Psychoeducational Group Note    Date: 02/01/2013 Time:  @T @    Goal Setting Purpose of Group: To be able to set a goal that is measurable and that can be accomplished in one day Participation Level:  Active  Participation Quality:  Appropriate  Affect:  Appropriate  Cognitive:  Appropriate  Insight:  Engaged  Engagement in Group:  Engaged  Additional Comments:  Pt stated her goal today was to "take a bath or a shower. I always feel better after I bathe. I am not feeling that great now and I know that a shower or bath will make me feel better".  Dione Housekeeper

## 2013-02-01 NOTE — Progress Notes (Signed)
D) Pt rates her depression at a 7 and hopelessness at a 6. Has thoughts of SI on and off. Has attended the groups and interacts with her peers. States that she is overwhelmed with her issues of job, money and relationships. Had to claim bankrupty which is very disturbing to her. A) Verbal contract made with Pt. Given support and reassurance along with praise. R) States that she will not hurt herself in the hospital. Wants the feelings of SI to go away completely.

## 2013-02-01 NOTE — Progress Notes (Signed)
Adult Psychoeducational Group Note  Date:  02/01/2013 Time:  1:15PM Group Topic/Focus:  Coping With Mental Health Crisis:   The purpose of this group is to help patients identify strategies for coping with mental health crisis.  Group discusses possible causes of crisis and ways to manage them effectively.  Participation Level:  Active  Participation Quality:  Appropriate and Attentive  Affect:  Appropriate  Cognitive:  Alert and Appropriate  Insight: Appropriate  Engagement in Group:  Engaged  Modes of Intervention:  Discussion  Additional Comments:  Pt. Was attentive and appropriate during today's group discussion. Pt was able to complete Love Language Quiz. Pt stated that that she wants to be able to understand all Love Languages due to interacting with support circle, friends and family. Pt. Stated that so people show Love the way they yearn or what they was missing out on.   Bing Plume D 02/01/2013, 3:33 PM

## 2013-02-01 NOTE — Progress Notes (Signed)
Patient attended AA meeting.

## 2013-02-02 MED ORDER — LORATADINE 10 MG PO TABS
ORAL_TABLET | ORAL | Status: AC
  Administered 2013-02-02: 17:00:00
  Filled 2013-02-02: qty 1

## 2013-02-02 MED ORDER — LORATADINE 10 MG PO TABS
10.0000 mg | ORAL_TABLET | Freq: Every day | ORAL | Status: DC
  Administered 2013-02-03 – 2013-02-07 (×5): 10 mg via ORAL
  Filled 2013-02-02 (×8): qty 1

## 2013-02-02 NOTE — Progress Notes (Signed)
D) Pt has some symptoms of allergies and states that she is not feeling well at all today, but decided to get up, go to groups and to take a shower to make herself feel better. Tries to push herself and not give into the feelings of wanting to withdraw and sleep. A) Given support and reassurance along with praise. Encouraged to continue to work on keeping her spirits up and pushing herself until she feels better able to to deal with her life.

## 2013-02-02 NOTE — Progress Notes (Signed)
Reviewed the information documented and agree with the treatment plan.  Lama Narayanan,JANARDHAHA R. 02/02/2013 1:29 PM 

## 2013-02-02 NOTE — Progress Notes (Signed)
Adult Psychoeducational Group Note  Date:  02/02/2013 Time:  7:11 PM  Group Topic/Focus:  Conflict Resolution:   The focus of this group is to discuss the conflict resolution process and how it may be used upon discharge.  Participation Level:  Active  Participation Quality:  Appropriate and Attentive  Affect:  Appropriate  Cognitive:  Appropriate  Insight: Appropriate  Engagement in Group:  Engaged  Modes of Intervention:  Problem-solving  Additional Comments:  Patient stated that her definition of a conflict was a "not coming to a conclusion." Patient was able to effectively identify and evaluate her current conflict aloud in group. Patient also volunteered to write notes on the board for facilitator in order to "stay awake."   Lyndee Hensen 02/02/2013, 7:11 PM

## 2013-02-02 NOTE — Progress Notes (Signed)
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 She reports feeling depressed and suicidal thoughts off and on. Patient reports that she does not need the librium which is scheduled for her to take because she does not have any withdrawal symptoms. Patient attended group on 500 hall. Patient reports feeling anxious and c/o having diarrhea. Patient received visteril and imodium. Writer will monitor effectiveness of medications. Patient verbally contracts for safety, deneis hi/a/v hallucinatons. Support and encouragement offered, safety maintain on unit, will continue to monitor.

## 2013-02-02 NOTE — [facility] Group Notes (Signed)
[facility] Group Notes:  (Clinical Social Work)  02/02/2013   3:00-4:00PM  Summary of Progress/Problems:   The main focus of today's process group was to   identify the patient's current support system and decide on other supports that can be put in place.  Four definitions/levels of support were discussed and an exercise was utilized to show how much stronger we become with additional supports.  An emphasis was placed on using counselor, doctor, therapy groups, 12-step groups, and problem-specific support groups to expand supports, as well as doing something different than has been done before. The patient expressed a willingness to ADD a psychiatrist and support groups to her life.  Type of Therapy:  Process Group  Participation Level:  Minimal  Participation Quality:  Drowsy  Affect:  Blunted  Cognitive:  Appropriate and Oriented  Insight:  Developing/Improving  Engagement in Therapy:  Developing/Improving  Modes of Intervention:  Education,  Support and Processing  Ambrose Mantle, LCSW 02/02/2013, 5:28 PM

## 2013-02-02 NOTE — Progress Notes (Signed)
Patient ID: Laura Lopez, female   DOB: 1986/04/20, 27 y.o.   MRN: [id number] [facility] MD Progress Note  02/02/2013 4:46 PM Laura Lopez  MRN:  [id number] Subjective:  "I don't feel that great today." and "I'm about the same as when I came in." "My allergies are messed up." Objective:  Patient appears depressed and notes that her depression is 8/10 and her anxiety is the same. Depression: Axis I: Anxiety Disorder NOS and Major Depression, Recurrent severe Axis II: Deferred Axis III:  Past Medical History  Diagnosis Date  . Depression   . GERD (gastroesophageal reflux disease)    Axis IV: other psychosocial or environmental problems, problems related to social environment and problems with primary support group Axis V: 41-50 serious symptoms  ADL's:  Intact  Sleep: Good  Appetite:  Good  Suicidal Ideation:  Plan:  None Intent:  None Means:  None Homicidal Ideation:  Denies  Psychiatric Specialty Exam: Review of Systems  Constitutional: Negative.   HENT: Positive for congestion.   Eyes: Negative.   Respiratory: Positive for cough and sputum production.   Cardiovascular: Negative.   Gastrointestinal: Negative.   Genitourinary: Negative.   Musculoskeletal: Negative.   Skin: Negative.   Neurological: Positive for headaches.  Endo/Heme/Allergies: Negative.   Psychiatric/Behavioral: Positive for depression and suicidal ideas. The patient is nervous/anxious.     Blood pressure 90/60, pulse 103, temperature 97.6 F (36.4 C), temperature source Oral, resp. rate 16, height 5\' 6"  (1.676 m), weight 79.833 kg (176 lb), last menstrual period 01/10/2013, SpO2 100.00%.Body mass index is 28.42 kg/(m^2).  General Appearance: Casual  Eye Contact::  Fair  Speech:  Normal Rate  Volume:  Normal  Mood:  Anxious and Depressed  Affect:  Congruent  Thought Process:  Coherent  Orientation:  Full (Time, Place, and Person)  Thought Content:  WDL  Suicidal Thoughts:  Yes.  without  intent/plan  Homicidal Thoughts:  No  Memory:  Immediate;   Fair Recent;   Fair Remote;   Fair  Judgement:  Fair  Insight:  Fair  Psychomotor Activity:  Decreased  Concentration:  Fair  Recall:  Fair  Akathisia:  No  Handed:  Right  AIMS (if indicated):     Assets:  Communication Skills Resilience Social Support  Sleep:  Number of Hours: 6.75   Current Medications: Current Facility-Administered Medications  Medication Dose Route Frequency Provider Last Rate Last Dose  . acetaminophen (TYLENOL) tablet 650 mg  650 mg Oral Q6H PRN Verne Spurr, PA-C      . alum & mag hydroxide-simeth (MAALOX/MYLANTA) 200-200-20 MG/5ML suspension 30 mL  30 mL Oral Q4H PRN Verne Spurr, PA-C   30 mL at 02/01/13 0845  . chlordiazePOXIDE (LIBRIUM) capsule 25 mg  25 mg Oral Q6H PRN Verne Spurr, PA-C      . chlordiazePOXIDE (LIBRIUM) capsule 25 mg  25 mg Oral BH-qamhs Verne Spurr, PA-C       Followed by  . [START ON 02/04/2013] chlordiazePOXIDE (LIBRIUM) capsule 25 mg  25 mg Oral Daily Verne Spurr, PA-C      . FLUoxetine (PROZAC) capsule 20 mg  20 mg Oral Daily Sanjuana Kava, NP   20 mg at 02/02/13 0847  . hydrOXYzine (ATARAX/VISTARIL) tablet 25 mg  25 mg Oral Q6H PRN Verne Spurr, PA-C   25 mg at 02/01/13 2104  . ibuprofen (ADVIL,MOTRIN) tablet 600 mg  600 mg Oral Q8H PRN Verne Spurr, PA-C      . loperamide (IMODIUM) capsule 2-4 mg  2-4 mg Oral PRN Verne Spurr, PA-C   4 mg at 02/01/13 2105  . loratadine (CLARITIN) 10 MG tablet           . magnesium hydroxide (MILK OF MAGNESIA) suspension 30 mL  30 mL Oral Daily PRN Verne Spurr, PA-C      . multivitamin with minerals tablet 1 tablet  1 tablet Oral Daily Verne Spurr, PA-C   1 tablet at 02/02/13 0848  . ondansetron (ZOFRAN-ODT) disintegrating tablet 4 mg  4 mg Oral Q6H PRN Verne Spurr, PA-C      . pantoprazole (PROTONIX) EC tablet 40 mg  40 mg Oral Daily Nanine Means, NP   40 mg at 02/02/13 0847  . thiamine (B-1) injection 100 mg  100 mg  Intramuscular Once [employer], PA-C      . thiamine (VITAMIN B-1) tablet 100 mg  100 mg Oral Daily Verne Spurr, PA-C   100 mg at 02/02/13 0848  . traZODone (DESYREL) tablet 50 mg  50 mg Oral QHS Sanjuana Kava, NP   50 mg at 02/01/13 2152    Lab Results: No results found for this or any previous visit (from the past 48 hour(s)).  Physical Findings: AIMS: Facial and Oral Movements Muscles of Facial Expression: None, normal Lips and Perioral Area: None, normal Jaw: None, normal Tongue: None, normal,Extremity Movements Upper (arms, wrists, hands, fingers): None, normal Lower (legs, knees, ankles, toes): None, normal, Trunk Movements Neck, shoulders, hips: None, normal, Overall Severity Severity of abnormal movements (highest score from questions above): None, normal Incapacitation due to abnormal movements: None, normal Patient's awareness of abnormal movements (rate only patient's report): No Awareness, Dental Status Current problems with teeth and/or dentures?: No Does patient usually wear dentures?: No  CIWA:  CIWA-Ar Total: 1 COWS:     Treatment Plan Summary: Daily contact with patient to assess and evaluate symptoms and progress in treatment Medication management  Plan:  Review of chart, vital signs, medications, and notes. 1-Individual and group therapy 2-Medication management for depression and anxiety:  Medications reviewed with the patient and she stated no untoward effects 3-Coping skills for depression and anxiety 4-Continue crisis stabilization and management 5-Address health issues--monitoring vital signs, stable 6-Treatment plan in progress to prevent relapse of depression and anxiety 7. Will order Clariten for allergy symptoms. Medical Decision Making Problem Points:  Established problem, stable/improving (1), New problem, with no additional work-up planned (3) and Review of psycho-social stressors (1) Data Points:  Review of medication regiment & side effects  (2)  I certify that inpatient services furnished can reasonably be expected to improve the patient's condition.  Rona Ravens. Aspynn Clover RPAC 5:08 PM 02/02/2013

## 2013-02-03 MED ORDER — FLUOXETINE HCL 20 MG PO CAPS
40.0000 mg | ORAL_CAPSULE | Freq: Every day | ORAL | Status: DC
  Administered 2013-02-04 – 2013-02-07 (×4): 40 mg via ORAL
  Filled 2013-02-03 (×2): qty 2
  Filled 2013-02-03: qty 14
  Filled 2013-02-03 (×3): qty 2

## 2013-02-03 NOTE — [facility] Group Notes (Signed)
[facility] LCSW Aftercare Discharge Planning Group Note   02/03/2013 11:28 AM  Participation Quality:    .Appropriate  Mood/Affect:  Appropriate  Depression Rating:    Anxiety Rating:    Thoughts of Suicide:  Yes  Will you contract for safety?   Yes  Current AVH:  No  Plan for Discharge/Comments:  Patient reports she is not doing well today.  She rates depression at eight and reports off/on SI but able to contract.  She will need follow up in [facility].  Transportation Means: Patient has transportation.   Supports:  Patient has a good support system.   Michaeljoseph Revolorio, Joesph July

## 2013-02-03 NOTE — Progress Notes (Signed)
Writer spoke with patient 1:1 and she reports to not feeling well today and just can't seem to shake it. Patient requested ibuprofen for stomach cramps she was experiencing which she received. Patient preparing to attend group this evening despite her not feeling well. Patient had visit from her kids today and she seemed sad after there visit. Writer offered patient encouragement and support for pushing through how she was feeling and going to group. Patient denies hi/a/v hallucinations but still reports that she has passive si and verbally contracts for safety. Writer will monitor effective of ibuprofen and reasses pain. Safety maintained on unit, will continue to monitor.

## 2013-02-03 NOTE — Progress Notes (Signed)
[facility] MD Progress Note  02/03/2013 1:28 PM Laura Lopez  MRN:  [id number] Subjective:  Patient reports feeling very depressed and anxious. Tolerating Prozac well without side effects, not helping with her mood yet.  Diagnosis:   Axis I: Major Depression, Recurrent severe Axis II: Deferred Axis III:  Past Medical History  Diagnosis Date  . Depression   . GERD (gastroesophageal reflux disease)    Axis IV: other psychosocial or environmental problems Axis V: 41-50 serious symptoms  ADL's:  Intact  Sleep: Fair  Appetite:  Fair   Psychiatric Specialty Exam: Review of Systems  Constitutional: Negative.   HENT: Negative.   Eyes: Negative.   Respiratory: Negative.   Cardiovascular: Negative.   Gastrointestinal: Negative.   Genitourinary: Negative.   Musculoskeletal: Negative.   Skin: Negative.   Neurological: Negative.   Endo/Heme/Allergies: Negative.   Psychiatric/Behavioral: Positive for depression and suicidal ideas. The patient is nervous/anxious.     Blood pressure 103/64, pulse 87, temperature 98 F (36.7 C), temperature source Oral, resp. rate 18, height 5\' 6"  (1.676 m), weight 79.833 kg (176 lb), last menstrual period 01/10/2013, SpO2 100.00%.Body mass index is 28.42 kg/(m^2).  General Appearance: Disheveled  Eye Solicitor::  Fair  Speech:  Slow  Volume:  Decreased  Mood:  Anxious, Depressed and Dysphoric  Affect:  Constricted  Thought Process:  Circumstantial  Orientation:  Full (Time, Place, and Person)  Thought Content:  Rumination  Suicidal Thoughts:  Yes.  without intent/plan  Homicidal Thoughts:  No  Memory:  Immediate;   Fair Recent;   Fair Remote;   Fair  Judgement:  Fair  Insight:  Present  Psychomotor Activity:  Decreased  Concentration:  Fair  Recall:  Fair  Akathisia:  No  Handed:  Right  AIMS (if indicated):     Assets:  Communication Skills Desire for Improvement  Sleep:  Number of Hours: 6.75   Current Medications: Current  Facility-Administered Medications  Medication Dose Route Frequency Provider Last Rate Last Dose  . acetaminophen (TYLENOL) tablet 650 mg  650 mg Oral Q6H PRN Verne Spurr, PA-C   650 mg at 02/03/13 4098  . alum & mag hydroxide-simeth (MAALOX/MYLANTA) 200-200-20 MG/5ML suspension 30 mL  30 mL Oral Q4H PRN Verne Spurr, PA-C   30 mL at 02/01/13 0845  . chlordiazePOXIDE (LIBRIUM) capsule 25 mg  25 mg Oral BH-qamhs Neil Mashburn, PA-C   25 mg at 02/03/13 1191   Followed by  . [START ON 02/04/2013] chlordiazePOXIDE (LIBRIUM) capsule 25 mg  25 mg Oral Daily Verne Spurr, PA-C      . [START ON 02/04/2013] FLUoxetine (PROZAC) capsule 40 mg  40 mg Oral Daily Lionardo Haze, MD      . ibuprofen (ADVIL,MOTRIN) tablet 600 mg  600 mg Oral Q8H PRN Verne Spurr, PA-C   600 mg at 02/02/13 1956  . loratadine (CLARITIN) tablet 10 mg  10 mg Oral Daily Verne Spurr, PA-C   10 mg at 02/03/13 4782  . magnesium hydroxide (MILK OF MAGNESIA) suspension 30 mL  30 mL Oral Daily PRN Verne Spurr, PA-C      . multivitamin with minerals tablet 1 tablet  1 tablet Oral Daily Verne Spurr, PA-C   1 tablet at 02/03/13 0807  . pantoprazole (PROTONIX) EC tablet 40 mg  40 mg Oral Daily Nanine Means, NP   40 mg at 02/03/13 0808  . thiamine (B-1) injection 100 mg  100 mg Intramuscular Once [employer], PA-C      . thiamine (VITAMIN  B-1) tablet 100 mg  100 mg Oral Daily Verne Spurr, PA-C   100 mg at 02/03/13 1191  . traZODone (DESYREL) tablet 50 mg  50 mg Oral QHS Sanjuana Kava, NP   50 mg at 02/02/13 2132    Lab Results: No results found for this or any previous visit (from the past 48 hour(s)).  Physical Findings: AIMS: Facial and Oral Movements Muscles of Facial Expression: None, normal Lips and Perioral Area: None, normal Jaw: None, normal Tongue: None, normal,Extremity Movements Upper (arms, wrists, hands, fingers): None, normal Lower (legs, knees, ankles, toes): None, normal, Trunk Movements Neck, shoulders, hips:  None, normal, Overall Severity Severity of abnormal movements (highest score from questions above): None, normal Incapacitation due to abnormal movements: None, normal Patient's awareness of abnormal movements (rate only patient's report): No Awareness, Dental Status Current problems with teeth and/or dentures?: No Does patient usually wear dentures?: No  CIWA:  CIWA-Ar Total: 1 COWS:  COWS Total Score: 6  Treatment Plan Summary: Daily contact with patient to assess and evaluate symptoms and progress in treatment Medication management  Plan: Increase Prozac to 40mg  po qd. Provide supportive counselling. Continue to monitor, plan for discharge in 2-3 days. Medical Decision Making Problem Points:  Established problem, stable/improving (1), Review of last therapy session (1) and Review of psycho-social stressors (1) Data Points:  Review of medication regiment & side effects (2) Review of new medications or change in dosage (2)  I certify that inpatient services furnished can reasonably be expected to improve the patient's condition.   Laura Lopez 02/03/2013, 1:28 PM

## 2013-02-03 NOTE — Progress Notes (Signed)
Pt. Is still depressed.  Pt. Denies SI and HI but states she is still hoping for that cloud hanging over her head to go away, and is hoping for a few more days at [facility].  Pt. Attended wrap up group this pm.  No complaints of pain or discomfort

## 2013-02-03 NOTE — Progress Notes (Addendum)
D:  Patient's self inventory sheet, patient sleeps well, has good appetite, low energy level, improving attention span.  Rated depression #9, hopelessness #7.  Denied withdrawals.  SI off/on, contracts for safety.  Has had menstrual cramps today.  Pain goal #8, worst pain #8.  "To be a better person and block out negative things" after discharge.  Denied SI and HI while talking to nurse.   Denied A/V hallucinations.  Does have discharge plans.  No problems taking meds after discharge. A:  Medications administered per MD order.  Support and encouragement given throughout day. R:  Following treatment plan.  Denied SI and HI while talking to nurse.  Denied A/V hallucinations.  Contracts for safety.

## 2013-02-03 NOTE — Progress Notes (Signed)
Chaplain follow up.  Provided support around feelings of distress, anxiety and depression.  Pt stated that today feels as though it is the worst of her days at [facility] so far.   Spoke with chaplain about hope that she can feel like herself again.  Discussed where she sees her spirituality in the midst of her crisis and explored areas in the psalms where [place] felt far from God and home.  Spoke with chaplain about God's presence in pain.    Affect brightened significantly when discussing children and their talents.    Will continue to follow for support.   Belva Crome Mdiv

## 2013-02-03 NOTE — Progress Notes (Addendum)
Recreation Therapy Notes  Date: 05.05.2014  Time: 3:00pm  Location: [address] Dayroom   Group Topic/Focus: Self Expression   Participation Level:  Did not attend  [employer], LRT/CTRS  Jearl Klinefelter 02/03/2013 4:51 PM

## 2013-02-03 NOTE — Progress Notes (Signed)
Date: 02/03/2013  Time: 11:00am  Group Topic/Focus:  Making Healthy Choices: The focus of this group is to help patients identify negative/unhealthy choices they were using prior to admission and identify positive/healthier coping strategies to replace them upon discharge.  Participation Level: Active  Participation Quality: Appropriate, Sharing and Supportive  Affect: Appropriate  Cognitive: Appropriate  Insight: Appropriate  Engagement in Group: Engaged and Supportive  Modes of Intervention: Discussion, Education, Problem-solving and Support  Additional Comments: none  Granvel Proudfoot M  02/03/2013, 1:25 PM  

## 2013-02-03 NOTE — Progress Notes (Signed)
Adult Psychoeducational Group Note  Date:  02/03/2013 Time:  8:00PM Group Topic/Focus:  Wrap-Up Group:   The focus of this group is to help patients review their daily goal of treatment and discuss progress on daily workbooks.  Participation Level:  Active  Participation Quality:  Appropriate and Attentive  Affect:  Appropriate  Cognitive:  Alert and Appropriate  Insight: Appropriate  Engagement in Group:  Engaged  Modes of Intervention:  Discussion  Additional Comments:  Pt. Was attentive and appropriate during tonight's group discussion. Pt was able to provide positive feedback to peers. Pt stated that she is feeling better. Pt stated that she is enjoying being linked to services. Pt. Stated that she didn't allow family members get to her.   Bing Plume D 02/03/2013, 9:28 PM

## 2013-02-04 MED ORDER — HYDROXYZINE HCL 25 MG PO TABS
25.0000 mg | ORAL_TABLET | Freq: Four times a day (QID) | ORAL | Status: DC | PRN
  Administered 2013-02-04 – 2013-02-06 (×2): 25 mg via ORAL

## 2013-02-04 MED ORDER — RISPERIDONE 0.25 MG PO TABS
0.2500 mg | ORAL_TABLET | Freq: Two times a day (BID) | ORAL | Status: DC
  Administered 2013-02-04 – 2013-02-07 (×6): 0.25 mg via ORAL
  Filled 2013-02-04 (×2): qty 1
  Filled 2013-02-04: qty 14
  Filled 2013-02-04: qty 1
  Filled 2013-02-04: qty 14
  Filled 2013-02-04 (×6): qty 1

## 2013-02-04 NOTE — Progress Notes (Signed)
Adult Psychoeducational Group Note  Date:  02/04/2013 Time:  2:14 PM  Group Topic/Focus:  Recovery Goals:   The focus of this group is to identify appropriate goals for recovery and establish a plan to achieve them.  Participation Level:  Active  Participation Quality:  Appropriate, Attentive and Sharing  Affect:  Appropriate  Cognitive:  Alert and Appropriate  Insight: Appropriate  Engagement in Group:  Engaged  Modes of Intervention:  Discussion  Additional Comments:  Pt was appropriate and sharing while attending group.Pt shared that recovery was starting over and finding yourself. Pt also shared that trying to please others and her spirituality are two barriers toward her recovery.   Sharyn Lull 02/04/2013, 2:14 PM

## 2013-02-04 NOTE — Progress Notes (Signed)
Patient ID: Laura Lopez, female   DOB: 1986-06-28, 28 y.o.   MRN: [id number] D:  Patient's self inventory sheet, patient sleeps well, has improving appetite, low energy level, improving attention span.  Rated depression #8, hopelessness #3.  Denied withdrawals.  SI off/on, contracts for safety.  Has experienced cramps in past 24 hours.  Pain goal #5 today, worst pain #5.  After discharge, plans to try to be happy, find a better job, housing for herself and children.  "I'm still at a all time high of depression and I know it takes time but I feel like I don't belong on earth."  No discharge plans.  No problems taking meds after discharge. A:  Medications administered per MD orders. Support and encouragement given throughout day.   R:  Following treatment plan.  Denied SI and HI.   Denied A/V hallucinations.  Contracts for safety. Will continue to monitor patient with 15 minute checks.  Safety maintained.

## 2013-02-04 NOTE — [facility] Group Notes (Signed)
[facility] LCSW Group Therapy      Feelings About Diagnosis 1:15 - 2:30 PM          02/04/2013 4:25 PM  Type of Therapy:  Group Therapy  Participation Level:  Active  Participation Quality:  Appropriate and Attentive  Affect:  Appropriate and Depressed  Cognitive:  Appropriate  Insight:  Developing/Improving and Engaged  Engagement in Therapy:  Developing/Improving and Engaged  Modes of Intervention:  Discussion, Education, Exploration, Problem-Solving, Rapport Building, Support  Summary of Progress/Problems:  Patient shared she had been advised of having Bipolar Disorder but does not understand what that means. Writer explained Bipolar Disorder.  Patient shared she is on the depressed side and hopes medications will start working soon as she does not like how she feels.  Wynn Banker 02/04/2013, 4:25 PM

## 2013-02-04 NOTE — Progress Notes (Signed)
[name] MD Progress Note  02/04/2013 12:49 PM Laura Lopez  MRN:  [id number] Subjective:  Patient reports feeling depressed, crying, feels like she does not want to be here. Very overwhelmed, worried about her 3 children, about being unemployed. Poor sleep. Feeling agitated. She is talking about how she had held a knife and wished it were all over.  Diagnosis:   Axis I: Major Depression, Recurrent severe Axis II: Deferred Axis III:  Past Medical History  Diagnosis Date  . Depression   . GERD (gastroesophageal reflux disease)    Axis IV: economic problems, occupational problems and other psychosocial or environmental problems Axis V: 41-50 serious symptoms  ADL's:  Impaired  Sleep: Poor  Appetite:  Fair   Psychiatric Specialty Exam: Review of Systems  Constitutional: Negative.   HENT: Negative.   Eyes: Negative.   Respiratory: Negative.   Cardiovascular: Negative.   Gastrointestinal: Negative.   Genitourinary: Negative.   Musculoskeletal: Negative.   Skin: Negative.   Neurological: Negative.   Endo/Heme/Allergies: Negative.   Psychiatric/Behavioral: Positive for depression, suicidal ideas and substance abuse. The patient is nervous/anxious.     Blood pressure 111/74, pulse 86, temperature 97.9 F (36.6 C), temperature source Oral, resp. rate 16, height 5\' 6"  (1.676 m), weight 79.833 kg (176 lb), last menstrual period 01/10/2013, SpO2 100.00%.Body mass index is 28.42 kg/(m^2).  General Appearance: Disheveled  Eye Solicitor::  Fair  Speech:  Slow  Volume:  Decreased  Mood:  Anxious, Depressed and Hopeless  Affect:  Constricted and Depressed  Thought Process:  Circumstantial  Orientation:  Full (Time, Place, and Person)  Thought Content:  Rumination  Suicidal Thoughts:  Yes.  with intent/plan  Homicidal Thoughts:  No  Memory:  Immediate;   Fair Recent;   Fair Remote;   Fair  Judgement:  Fair  Insight:  Present  Psychomotor Activity:  Decreased  Concentration:   Fair  Recall:  Fair  Akathisia:  No  Handed:  Right  AIMS (if indicated):     Assets:  Communication Skills Social Support  Sleep:  Number of Hours: 6.5   Current Medications: Current Facility-Administered Medications  Medication Dose Route Frequency Provider Last Rate Last Dose  . acetaminophen (TYLENOL) tablet 650 mg  650 mg Oral Q6H PRN Verne Spurr, PA-C   650 mg at 02/03/13 4098  . alum & mag hydroxide-simeth (MAALOX/MYLANTA) 200-200-20 MG/5ML suspension 30 mL  30 mL Oral Q4H PRN Verne Spurr, PA-C   30 mL at 02/01/13 0845  . FLUoxetine (PROZAC) capsule 40 mg  40 mg Oral Daily Davia Smyre, MD   40 mg at 02/04/13 0756  . hydrOXYzine (ATARAX/VISTARIL) tablet 25 mg  25 mg Oral Q6H PRN Fransisca Kaufmann, NP      . ibuprofen (ADVIL,MOTRIN) tablet 600 mg  600 mg Oral Q8H PRN Verne Spurr, PA-C   600 mg at 02/04/13 0759  . loratadine (CLARITIN) tablet 10 mg  10 mg Oral Daily Verne Spurr, PA-C   10 mg at 02/04/13 0756  . magnesium hydroxide (MILK OF MAGNESIA) suspension 30 mL  30 mL Oral Daily PRN Verne Spurr, PA-C      . multivitamin with minerals tablet 1 tablet  1 tablet Oral Daily Verne Spurr, PA-C   1 tablet at 02/04/13 0757  . pantoprazole (PROTONIX) EC tablet 40 mg  40 mg Oral Daily Nanine Means, NP   40 mg at 02/04/13 0757  . thiamine (B-1) injection 100 mg  100 mg Intramuscular Once [employer], PA-C      .  thiamine (VITAMIN B-1) tablet 100 mg  100 mg Oral Daily Verne Spurr, PA-C   100 mg at 02/04/13 0757  . traZODone (DESYREL) tablet 50 mg  50 mg Oral QHS Sanjuana Kava, NP   50 mg at 02/03/13 2152    Lab Results: No results found for this or any previous visit (from the past 48 hour(s)).  Physical Findings: AIMS: Facial and Oral Movements Muscles of Facial Expression: None, normal Lips and Perioral Area: None, normal Jaw: None, normal Tongue: None, normal,Extremity Movements Upper (arms, wrists, hands, fingers): None, normal Lower (legs, knees, ankles, toes): None,  normal, Trunk Movements Neck, shoulders, hips: None, normal, Overall Severity Severity of abnormal movements (highest score from questions above): None, normal Incapacitation due to abnormal movements: None, normal Patient's awareness of abnormal movements (rate only patient's report): No Awareness, Dental Status Current problems with teeth and/or dentures?: No Does patient usually wear dentures?: No  CIWA:  CIWA-Ar Total: 1 COWS:  COWS Total Score: 2  Treatment Plan Summary: Daily contact with patient to assess and evaluate symptoms and progress in treatment Medication management  Plan: Start Risperdal at 0.25mg  po bid to supplement prozac. Side effects and benefits discussed. Continue to provide supportive counselling and education. Continue to monitor.  Medical Decision Making Problem Points:  Established problem, stable/improving (1), Review of last therapy session (1) and Review of psycho-social stressors (1) Data Points:  Review of medication regiment & side effects (2) Review of new medications or change in dosage (2)  I certify that inpatient services furnished can reasonably be expected to improve the patient's condition.   Melody Savidge 02/04/2013, 12:49 PM

## 2013-02-05 MED ORDER — MENTHOL 3 MG MT LOZG
1.0000 | LOZENGE | OROMUCOSAL | Status: DC | PRN
  Administered 2013-02-05 – 2013-02-06 (×2): 3 mg via ORAL

## 2013-02-05 NOTE — Progress Notes (Signed)
Adult Psychoeducational Group Note  Date:  02/05/2013 Time:  1:37 PM  Group Topic/Focus:  Therapeutic Activity- "Apples to Apples"  Participation Level:  None  Participation Quality:  Drowsy  Affect:  Flat  Cognitive:  Appropriate  Insight: Appropriate and Limited  Engagement in Group:  Limited  Modes of Intervention:  Activity  Additional Comments: Pt refused to participate. Pt remained in dayroom but did not play "Apples to Apples".    Sharyn Lull 02/05/2013, 1:37 PM

## 2013-02-05 NOTE — Tx Team (Signed)
Interdisciplinary Treatment Plan Update   Date Reviewed:  02/05/2013  Time Reviewed:  10:24 AM  Progress in Treatment:   Attending groups: Yes Participating in groups: Yes Taking medication as prescribed: Yes  Tolerating medication: Yes Family/Significant other contact made: No, but contact to be made with mother.  Patient understands diagnosis: Yes  Discussing patient identified problems/goals with staff: Yes Medical problems stabilized or resolved: Yes Denies suicidal/homicidal ideation: No, but able to contract for safety. Patient has not harmed self or others: Yes  For review of initial/current patient goals, please see plan of care.  Estimated Length of Stay:  3-5 days  Reasons for Continued Hospitalization:  Anxiety Depression Medication stabilization Suicidal ideation  New Problems/Goals identified:    Discharge Plan or Barriers:   Home with outpatient follow up in [facility]  Additional Comments:  Patient continues to endorse SI but contracts for safety.  She rates depression and anxiety at nine.  MD to continue medication adjustments as needed.  Attendees:  Patient:  02/05/2013 10:24 AM   Signature: Patrick North, MD 02/05/2013 10:24 AM  Signature:Tina Arlana Pouch, RN 02/05/2013 10:24 AM  Signature: Nestor Ramp, RN 02/05/2013 10:24 AM  Signature: 02/05/2013 10:24 AM  Signature:   02/05/2013 10:24 AM  Signature:  Juline Patch, LCSW 02/05/2013 10:24 AM  Signature: Silverio Decamp, PMH-NP 02/05/2013 10:24 AM  Signature: 02/05/2013 10:24 AM  Signature: 02/05/2013 10:24 AM  Signature:    Signature:    Signature:      Scribe for Treatment Team:   Juline Patch,  02/05/2013 10:24 AM

## 2013-02-05 NOTE — [facility] Group Notes (Signed)
[facility] LCSW Aftercare Discharge Planning Group Note   02/05/2013 3:45 PM  Participation Quality:  Appropriate  Mood/Affect:  Appropriate, Depressed and Tearful  Depression Rating:  9  Anxiety Rating:  9  Thoughts of Suicide:  Yes  Will you contract for safety?   Yes  Current AVH:  No  Plan for Discharge/Comments:  Patient continues to endorse SI but contracts for safety.  She advised of being really sad today because it is her 28 year old daughter's birthday and she is not home with her.   Patient shared she just wants to get better.  Transportation Means: Patient has transportation.   Supports:  Patient has a good support system.   Keshona Kartes, Joesph July

## 2013-02-05 NOTE — Progress Notes (Signed)
[facility] MD Progress Note  02/05/2013 1:54 PM Laura Lopez  MRN:  [id number] Subjective:  Patient continues to report being very depressed. States she is sleeping better, not eating well. Tolerating the risperdal well, started yesterday, agitation has improved. Continues to feel hopeless and that life is not worth living. Daughter`s 10th birthday is today and patient sad about not being with her. Diagnosis:   Axis I: Major Depression, Recurrent severe Axis II: Deferred Axis III:  Past Medical History  Diagnosis Date  . Depression   . GERD (gastroesophageal reflux disease)    Axis IV: other psychosocial or environmental problems Axis V: 41-50 serious symptoms  ADL's:  Intact  Sleep: Fair  Appetite:  Fair   Psychiatric Specialty Exam: Review of Systems  Constitutional: Negative.   HENT: Negative.   Eyes: Negative.   Respiratory: Negative.   Cardiovascular: Negative.   Gastrointestinal: Negative.   Genitourinary: Negative.   Musculoskeletal: Negative.   Skin: Negative.   Neurological: Negative.   Endo/Heme/Allergies: Negative.   Psychiatric/Behavioral: Positive for depression and suicidal ideas.    Blood pressure 111/74, pulse 86, temperature 97.9 F (36.6 C), temperature source Oral, resp. rate 16, height 5\' 6"  (1.676 m), weight 79.833 kg (176 lb), last menstrual period 01/10/2013, SpO2 100.00%.Body mass index is 28.42 kg/(m^2).  General Appearance: Casual  Eye Contact::  Fair  Speech:  Slow  Volume:  Decreased  Mood:  Depressed, Dysphoric and Hopeless  Affect:  Constricted and Depressed  Thought Process:  Circumstantial  Orientation:  Full (Time, Place, and Person)  Thought Content:  Rumination  Suicidal Thoughts:  No  Homicidal Thoughts:  No  Memory:  Immediate;   Fair Recent;   Fair Remote;   Fair  Judgement:  Fair  Insight:  Present  Psychomotor Activity:  Decreased  Concentration:  Fair  Recall:  Fair  Akathisia:  No  Handed:  Right  AIMS (if  indicated):     Assets:  Communication Skills Desire for Improvement  Sleep:  Number of Hours: 6.25   Current Medications: Current Facility-Administered Medications  Medication Dose Route Frequency Provider Last Rate Last Dose  . acetaminophen (TYLENOL) tablet 650 mg  650 mg Oral Q6H PRN Verne Spurr, PA-C   650 mg at 02/05/13 0802  . alum & mag hydroxide-simeth (MAALOX/MYLANTA) 200-200-20 MG/5ML suspension 30 mL  30 mL Oral Q4H PRN Verne Spurr, PA-C   30 mL at 02/01/13 0845  . FLUoxetine (PROZAC) capsule 40 mg  40 mg Oral Daily Verle Brillhart, MD   40 mg at 02/05/13 0801  . hydrOXYzine (ATARAX/VISTARIL) tablet 25 mg  25 mg Oral Q6H PRN Fransisca Kaufmann, NP   25 mg at 02/04/13 1329  . ibuprofen (ADVIL,MOTRIN) tablet 600 mg  600 mg Oral Q8H PRN Verne Spurr, PA-C   600 mg at 02/04/13 2134  . loratadine (CLARITIN) tablet 10 mg  10 mg Oral Daily Verne Spurr, PA-C   10 mg at 02/05/13 0801  . magnesium hydroxide (MILK OF MAGNESIA) suspension 30 mL  30 mL Oral Daily PRN Verne Spurr, PA-C      . menthol-cetylpyridinium (CEPACOL) lozenge 3 mg  1 lozenge Oral PRN Smaran Gaus, MD   3 mg at 02/05/13 1324  . multivitamin with minerals tablet 1 tablet  1 tablet Oral Daily Verne Spurr, PA-C   1 tablet at 02/05/13 0801  . pantoprazole (PROTONIX) EC tablet 40 mg  40 mg Oral Daily Nanine Means, NP   40 mg at 02/05/13 0801  . risperiDONE (RISPERDAL)  tablet 0.25 mg  0.25 mg Oral BID Geneve Kimpel, MD   0.25 mg at 02/05/13 0801  . thiamine (B-1) injection 100 mg  100 mg Intramuscular Once [employer], PA-C      . thiamine (VITAMIN B-1) tablet 100 mg  100 mg Oral Daily Verne Spurr, PA-C   100 mg at 02/05/13 0800  . traZODone (DESYREL) tablet 50 mg  50 mg Oral QHS Sanjuana Kava, NP   50 mg at 02/04/13 2131    Lab Results: No results found for this or any previous visit (from the past 48 hour(s)).  Physical Findings: AIMS: Facial and Oral Movements Muscles of Facial Expression: None, normal Lips  and Perioral Area: None, normal Jaw: None, normal Tongue: None, normal,Extremity Movements Upper (arms, wrists, hands, fingers): None, normal Lower (legs, knees, ankles, toes): None, normal, Trunk Movements Neck, shoulders, hips: None, normal, Overall Severity Severity of abnormal movements (highest score from questions above): None, normal Incapacitation due to abnormal movements: None, normal Patient's awareness of abnormal movements (rate only patient's report): No Awareness, Dental Status Current problems with teeth and/or dentures?: No Does patient usually wear dentures?: No  CIWA:  CIWA-Ar Total: 1 COWS:  COWS Total Score: 2  Treatment Plan Summary: Daily contact with patient to assess and evaluate symptoms and progress in treatment Medication management  Plan: Continue current plan for today. Consider increasing Prozac if patient does not improve. Provide supportive counselling and education.  Medical Decision Making Problem Points:  Established problem, stable/improving (1), Review of last therapy session (1) and Review of psycho-social stressors (1) Data Points:  Review of medication regiment & side effects (2)  I certify that inpatient services furnished can reasonably be expected to improve the patient's condition.   Clista Rainford 02/05/2013, 1:54 PM

## 2013-02-05 NOTE — Progress Notes (Signed)
Adult Psychoeducational Group Note  Date:  02/05/2013 Time:  8:48 PM  Group Topic/Focus:  Wrap-Up Group:   The focus of this group is to help patients review their daily goal of treatment and discuss progress on daily workbooks.  Participation Level:  Active  Participation Quality:  Appropriate  Affect:  Appropriate  Cognitive:  Alert  Insight: Appropriate  Engagement in Group:  Engaged  Modes of Intervention:  Discussion  Additional Comments:  Pt stated that her day started off rough because today is her daughter's birthday. As the day has went on it was gotten better, she also really enjoyed going outside and listening to music.  Flonnie Hailstone 02/05/2013, 8:48 PM
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
## 2013-02-05 NOTE — Progress Notes (Signed)
Pt. Still rates her depression at an 8 this pm.  States that she is not ready to discharge but will be thinking about her Daughter and missing her birthday tomorrow.  Pt. Says she wants to stay at [facility] until the cloud goes away and she is better.  Pt. Is hoping she can get her life back together at D/C, which would include finding a better job and home for her 3 children.  Pt. Contracts for safety.  Pt. Is still having some pain due to menstrual cycle.

## 2013-02-05 NOTE — Progress Notes (Signed)
  D) Patient quiet but cooperative upon my assessment. Patient completed Patient Self Inventory, reports slept "well," and  appetite is "good." Patient rates depression as   9/10, patient rates hopeless feelings as 2 /10. Patient endorses "off and on" SI, contracts verbally for safety with RN. Patient denies HI, denies A/V hallucinations. Patient verbalizes "I want to be home for my daughter's birthday but I understand I need to be here right now." Patient complains of sore throat related to "sinus drainage."   A) Patient given PRN lozenges for sore throat, verbalizes "my throat is starting to feel better." Patient offered support and encouragement, patient encouraged to discuss feelings/concerns with staff. Patient verbalized understanding. Patient monitored Q15 minutes for safety. Patient met with MD  to discuss today's goals and plan of care.  R) Patient visible in milieu, attending most groups in day room and meals in dining room. Patient appropriate with staff and peers.   Patient has a plan to "just be well and better and happy." Patient taking medications as ordered. Will continue to monitor.

## 2013-02-05 NOTE — Progress Notes (Signed)
Adult Psychoeducational Group Note  Date:  02/05/2013 Time:  2:37 PM  Group Topic/Focus:  Personal Choices and Values:   The focus of this group is to help patients assess and explore the importance of values in their lives, how their values affect their decisions, how they express their values and what opposes their expression.  Participation Level:  Active  Participation Quality:  Appropriate, Attentive and Sharing  Affect:  Appropriate  Cognitive:  Appropriate  Insight: Appropriate  Engagement in Group:  Engaged  Modes of Intervention:  Discussion  Additional Comments:  Pt was appropriate and sharing while attending group. Pt stated that her health, self acceptance, and being emotional stable are three things that she valued. Pt also stated that she like to be around positive people so that she will remain positive.   Sharyn Lull 02/05/2013, 2:37 PM

## 2013-02-06 NOTE — Plan of Care (Signed)
Problem: Alteration in mood & ability to function due to Goal: LTG-Patient demonstrates decreased signs of withdrawal Patient is not endorsing any symptoms of withdrawal.  .Sharmel Ballantine, LCSW 02/06/2013 Outcome: Completed/Met Date Met:  02/06/13 Patient is attending groups and talking about problems that lead to admission.  She is not endorsing any symptoms of withdrawal.  .Jazyah Butsch, LCSW 02/06/2013

## 2013-02-06 NOTE — Progress Notes (Signed)
Adult Psychoeducational Group Note  Date:  02/06/2013 Time:  10:40 PM  Group Topic/Focus:  Karaoke   Participation Level:  Active  Participation Quality:  Appropriate  Affect:  Appropriate  Cognitive:  Alert  Insight: Appropriate  Engagement in Group:  Engaged  Modes of Intervention:  Activity  Additional Comments:    Flonnie Hailstone 02/06/2013, 10:40 PM

## 2013-02-06 NOTE — B[facility] Group Notes (Signed)
[facility] LCSW Aftercare Discharge Planning Group Note   02/06/2013 10:43 AM  Participation Quality:  Appropriate  Mood/Affect:  Appropriate and Depressed  Depression Rating:  8  Anxiety Rating:  7  Thoughts of Suicide:  Yes  Will you contract for safety?   Yes  Current AVH:  No  Plan for Discharge/Comments:  Patient continues to endorse SI but contracts for safety.  She advised of feelings of worthlessness but knows that is not true.  Patient shared she would likely act on thoughts if discharged from the hospital.  Transportation Means: Patient has transportation.   Supports:  Patient has a good support system.   Vinia Jemmott, Joesph July

## 2013-02-06 NOTE — Progress Notes (Signed)
Patient ID: Laura Lopez, female   DOB: [birth year], 27 y.o.   MRN: [id number]   D: Writer asked pt about her day. Pt stated, "I know I'm not ready mentally. The depression part weighs on me." Pt informed the writer that her son's bday is today, but she still doesn't want be discharged. Stated, "I want to be ready when I leave here. I don't want to keep coming back".   A:  Support and encouragement was offered. 15 min checks continued for safety.  R: Pt remains safe.

## 2013-02-06 NOTE — [facility] Suicide Risk Assessment (Signed)
[facility] INPATIENT:  Family/Significant Other Suicide Prevention Education  Suicide Prevention Education:  Education Completed; [place], [place], [phone]; has been identified by the patient as the family member/significant other with whom the patient will be residing, and identified as the person(s) who will aid the patient in the event of a mental health crisis (suicidal ideations/suicide attempt).  With written consent from the patient, the family member/significant other has been provided the following suicide prevention education, prior to the and/or following the discharge of the patient.  The suicide prevention education provided includes the following:  Suicide risk factors  Suicide prevention and interventions  National Suicide Hotline telephone number  [facility] assessment telephone number  [facility] Emergency Assistance 911  [facility] and/or Residential Mobile Crisis Unit telephone number  Request made of family/significant other to:  Remove weapons (e.g., guns, rifles, knives), all items previously/currently identified as safety concern.Grandmother advised patient does not have access to guns.  Remove drugs/medications (over-the-counter, prescriptions, illicit drugs), all items previously/currently identified as a safety concern.  The family member/significant other verbalizes understanding of the suicide prevention education information provided.  The family member/significant other agrees to remove the items of safety concern listed above.  Wynn Banker 02/07/2013  12:56 PM

## 2013-02-06 NOTE — Progress Notes (Signed)
[facility] MD Progress Note  02/06/2013 11:32 AM Laura Lopez  MRN:  [id number] Subjective:  Patient reports feeling depressed and having suicidal thoughts on and off. However her agitation has improved. Had difficulty sleeping through the night.  Diagnosis:   Axis I: Major Depression, Recurrent severe Axis II: Deferred Axis III:  Past Medical History  Diagnosis Date  . Depression   . GERD (gastroesophageal reflux disease)    Axis IV: other psychosocial or environmental problems Axis V: 41-50 serious symptoms  ADL's:  Impaired  Sleep: Poor  Appetite:  Fair   Psychiatric Specialty Exam: Review of Systems  Constitutional: Negative.   HENT: Negative.   Eyes: Negative.   Respiratory: Negative.   Cardiovascular: Negative.   Gastrointestinal: Negative.   Genitourinary: Negative.   Musculoskeletal: Negative.   Skin: Negative.   Neurological: Negative.   Endo/Heme/Allergies: Negative.   Psychiatric/Behavioral: Positive for depression and suicidal ideas. The patient is nervous/anxious.     Blood pressure 123/70, pulse 109, temperature 97.8 F (36.6 C), temperature source Oral, resp. rate 20, height 5\' 6"  (1.676 m), weight 79.833 kg (176 lb), last menstrual period 01/10/2013, SpO2 100.00%.Body mass index is 28.42 kg/(m^2).  General Appearance: Casual  Eye Contact::  Fair  Speech:  Clear and Coherent  Volume:  Decreased  Mood:  Depressed and Dysphoric  Affect:  Constricted and Depressed  Thought Process:  Circumstantial  Orientation:  Full (Time, Place, and Person)  Thought Content:  Rumination  Suicidal Thoughts:  Yes.  without intent/plan  Homicidal Thoughts:  No  Memory:  Immediate;   Fair Recent;   Fair Remote;   Fair  Judgement:  Fair  Insight:  Present  Psychomotor Activity:  Normal  Concentration:  Fair  Recall:  Fair  Akathisia:  No  Handed:  Right  AIMS (if indicated):     Assets:  Communication Skills Desire for Improvement Social Support  Sleep:   Number of Hours: 6.75   Current Medications: Current Facility-Administered Medications  Medication Dose Route Frequency Provider Last Rate Last Dose  . acetaminophen (TYLENOL) tablet 650 mg  650 mg Oral Q6H PRN Verne Spurr, PA-C   650 mg at 02/05/13 0802  . alum & mag hydroxide-simeth (MAALOX/MYLANTA) 200-200-20 MG/5ML suspension 30 mL  30 mL Oral Q4H PRN Verne Spurr, PA-C   30 mL at 02/01/13 0845  . FLUoxetine (PROZAC) capsule 40 mg  40 mg Oral Daily Yandel Zeiner, MD   40 mg at 02/06/13 0735  . hydrOXYzine (ATARAX/VISTARIL) tablet 25 mg  25 mg Oral Q6H PRN Fransisca Kaufmann, NP   25 mg at 02/04/13 1329  . ibuprofen (ADVIL,MOTRIN) tablet 600 mg  600 mg Oral Q8H PRN Verne Spurr, PA-C   600 mg at 02/05/13 2200  . loratadine (CLARITIN) tablet 10 mg  10 mg Oral Daily Verne Spurr, PA-C   10 mg at 02/06/13 0734  . magnesium hydroxide (MILK OF MAGNESIA) suspension 30 mL  30 mL Oral Daily PRN Verne Spurr, PA-C      . menthol-cetylpyridinium (CEPACOL) lozenge 3 mg  1 lozenge Oral PRN Kermit Arnette, MD   3 mg at 02/06/13 1050  . multivitamin with minerals tablet 1 tablet  1 tablet Oral Daily Verne Spurr, PA-C   1 tablet at 02/06/13 0734  . pantoprazole (PROTONIX) EC tablet 40 mg  40 mg Oral Daily Nanine Means, NP   40 mg at 02/06/13 0734  . risperiDONE (RISPERDAL) tablet 0.25 mg  0.25 mg Oral BID Odel Schmid, MD   0.25  mg at 02/06/13 0734  . thiamine (B-1) injection 100 mg  100 mg Intramuscular Once [employer], PA-C      . thiamine (VITAMIN B-1) tablet 100 mg  100 mg Oral Daily Verne Spurr, PA-C   100 mg at 02/06/13 0734  . traZODone (DESYREL) tablet 50 mg  50 mg Oral QHS Sanjuana Kava, NP   50 mg at 02/05/13 2158    Lab Results: No results found for this or any previous visit (from the past 48 hour(s)).  Physical Findings: AIMS: Facial and Oral Movements Muscles of Facial Expression: None, normal Lips and Perioral Area: None, normal Jaw: None, normal Tongue: None, normal,Extremity  Movements Upper (arms, wrists, hands, fingers): None, normal Lower (legs, knees, ankles, toes): None, normal, Trunk Movements Neck, shoulders, hips: None, normal, Overall Severity Severity of abnormal movements (highest score from questions above): None, normal Incapacitation due to abnormal movements: None, normal Patient's awareness of abnormal movements (rate only patient's report): No Awareness, Dental Status Current problems with teeth and/or dentures?: No Does patient usually wear dentures?: No  CIWA:  CIWA-Ar Total: 1 COWS:  COWS Total Score: 2  Treatment Plan Summary: Daily contact with patient to assess and evaluate symptoms and progress in treatment Medication management  Plan: Continue current plan of care. Provide supportive counselling and education. Plan for discharge tomorrow if stabilized.  Medical Decision Making Problem Points:  Established problem, stable/improving (1), Review of last therapy session (1) and Review of psycho-social stressors (1) Data Points:  Review of medication regiment & side effects (2) Review of new medications or change in dosage (2)  I certify that inpatient services furnished can reasonably be expected to improve the patient's condition.   Nikolaus Pienta 02/06/2013, 11:32 AM

## 2013-02-06 NOTE — Progress Notes (Signed)
D: Patient came to the window at the beginning of the shift. She endorsed on/off SI but denies a/v hallucinations. Mood and affects flat and depressed. Patient reported that she was told she might be going home tomorrow, but  Felt she's not ready. She said she might need a day more to feel all better.  A: Writer encouraged patient to discuss her feelings and concern to the physician in the morning.  R: Patient receptive to encouragement ans support. Q 15 minute check continues as ordered to maintain safety.

## 2013-02-06 NOTE — Progress Notes (Signed)
Adult Psychoeducational Group Note  Date:  02/06/2013 Time:  1000   Group Topic/Focus:  Building Self Esteem:   The Focus of this group is helping patients become aware of the effects of self-esteem on their lives, the things they and others do that enhance or undermine their self-esteem, seeing the relationship between their level of self-esteem and the choices they make and learning ways to enhance self-esteem.  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

## 2013-02-06 NOTE — [facility] Group Notes (Signed)
[facility] LCSW Group Therapy     [facility] 1:15 - 2:30 PM  02/06/2013 3:13 PM  Type of Therapy:  Group Therapy  Participation Level:  Minimal  Participation Quality:  Appropriate  Affect:  Depressed  Cognitive:  Appropriate  Insight:  Developing/Improving and Engaged  Engagement in Therapy:  Developing/Improving and Engaged  Modes of Intervention:  Discussion, Education, Exploration, Problem-Solving, Rapport Building, Support  Summary of Progress/Problems:  Patient listened attentively to speaker from [facility] but did not make comments on presentation.   Wynn Banker 02/06/2013, 3:13 PM

## 2013-02-06 NOTE — Progress Notes (Signed)
  D) Patient quiet but cooperative upon my assessment. Patient completed Patient Self Inventory, reports slept "well," and  appetite is "good." Patient rates depression as  8 /10, patient rates hopeless feelings as 1 /10. Patient endorses "off and on" SI, contracts verbally for safety with RN. Patient denies HI, denies A/V hallucinations. Patient verbalizes "this is the first day I have felt better."   A) Patient offered support and encouragement, patient encouraged to discuss feelings/concerns with staff. Patient verbalized understanding. Patient monitored Q15 minutes for safety. Patient met with MD  to discuss today's goals and plan of care.  R) Patient visible in milieu, attending groups in day room and meals in dining room. Patient appropriate with staff and peers.   Patient taking medications as ordered. Patient insightful with a plan "to continue meds and try to keep improving myself." Will continue to monitor.

## 2013-02-07 DIAGNOSIS — F331 Major depressive disorder, recurrent, moderate: Secondary | ICD-10-CM

## 2013-02-07 MED ORDER — FLUOXETINE HCL 40 MG PO CAPS: 40.0000 mg | ORAL_CAPSULE | Freq: Every day | ORAL | Status: DC

## 2013-02-07 MED ORDER — TRAZODONE HCL 50 MG PO TABS: 50.0000 mg | ORAL_TABLET | Freq: Every day | ORAL | Status: DC

## 2013-02-07 MED ORDER — RISPERIDONE 0.25 MG PO TABS: 0.2500 mg | ORAL_TABLET | Freq: Two times a day (BID) | ORAL | Status: DC

## 2013-02-07 MED ORDER — OMEPRAZOLE 20 MG PO CPDR: 20.0000 mg | DELAYED_RELEASE_CAPSULE | Freq: Every day | ORAL | Status: DC

## 2013-02-07 MED ORDER — SUCRALFATE 1 GM/10ML PO SUSP: 1.0000 g | Freq: Four times a day (QID) | ORAL | Status: DC

## 2013-02-07 NOTE — Discharge Summary (Signed)
Physician Discharge Summary Note  Patient:  Laura Lopez is an 27 y.o., female MRN:  [id number] DOB:  March 19, 1986 Patient phone:  [phone] (home)  Patient address:   [address] [place] [address],   Date of Admission:  01/30/2013 Date of Discharge: 02/07/13  Reason for Admission:  Depression with SI  Discharge Diagnoses: Principal Problem:   Major depressive disorder, recurrent episode, moderate  Review of Systems  Constitutional: Negative.   HENT: Negative.   Eyes: Negative.   Respiratory: Negative.   Cardiovascular: Negative.   Gastrointestinal: Negative.   Genitourinary: Negative.   Musculoskeletal: Negative.   Skin: Negative.   Neurological: Negative.   Endo/Heme/Allergies: Negative.   Psychiatric/Behavioral: Positive for depression. Negative for suicidal ideas, hallucinations, memory loss and substance abuse. The patient is nervous/anxious. The patient does not have insomnia.    Axis Diagnosis:   AXIS I:  Major Depression, Recurrent severe AXIS II:  Deferred AXIS III:   Past Medical History  Diagnosis Date  . Depression   . GERD (gastroesophageal reflux disease)    AXIS IV:  other psychosocial or environmental problems AXIS V:  61-70 mild symptoms  Level of Care:  OP  Hospital Course:  Laura Lopez is an 27 y.o. female. Increasing depression for the past month with feelings of not wanting to live. She said that if "I go back out there I will cut myself to die". She lost a job in November 2013 and while she has another job she cannot afford things that she and her 3 children need. Right now the children are with their grandmother. They have been living with a friend and pt (and her children) can return there. Pt is dependent on others to take her to work or any other place that she needs to go. In addition to loosing her job she experienced a break up with a boyfriend and she said, "I lost everything in November and there is no reason that I should  be here". She is not able to contract for safety.    The duration of stay was eight days. The patient was seen and evaluated by the Treatment team consisting of Psychiatrist, NP-C, RN, Case Manager, and Therapist for evaluation and treatment plan with goal of stabilization upon discharge. The patient's physical and mental health problems were identified and treated appropriately.      Multiple modalities of treatment were used including medication, individual and group therapies, unit programming, improved nutrition, physical activity, and family sessions as needed. The patient's medications were managed by the MD during her stay. Patient was started on Prozac 20 mg to help with depressive symptoms and Risperdal 0.25 mg po bid to help with feelings of agitation. Her agitation began to decrease but depressive symptoms continued. Patient's Prozac was then increased to 40 mg. Patient had trazodone 50 mg available to help improve her quality of sleep.      The symptoms of depression and suicidal thoughts were monitored daily by evaluation by clinical provider.  The patient's mental and emotional status was evaluated by a daily self inventory completed by the patient. The patient's mood began to stabilize with self reported improvement in symptoms. The patient was able to express optimism for her future and felt hopeful.       Improvement was demonstrated by declining numbers on the self assessment, improving vital signs, increased cognition, and improvement in mood, sleep, appetite as well as a reduction in physical symptoms.       The  patient was evaluated and found to be stable enough for discharge and was released to home per the initial plan of treatment. The patient was provided with prescriptions for the medications started in the hospital. Thanh denied any suicidal thoughts prior to her discharge.   Mental Status Exam:  For mental status exam please see mental status exam and  suicide risk assessment  completed by attending physician prior to discharge.      Consults:  None  Significant Diagnostic Studies:  labs: Chem profile, CBC, UA WNL, UA positive for THC/cocaine  Discharge Vitals:   Blood pressure 137/81, pulse 99, temperature 98.1 F (36.7 C), temperature source Oral, resp. rate 16, height 5\' 6"  (1.676 m), weight 79.833 kg (176 lb), last menstrual period 01/10/2013, SpO2 100.00%. Body mass index is 28.42 kg/(m^2). Lab Results:   No results found for this or any previous visit (from the past 72 hour(s)).  Physical Findings: AIMS: Facial and Oral Movements Muscles of Facial Expression: None, normal Lips and Perioral Area: None, normal Jaw: None, normal Tongue: None, normal,Extremity Movements Upper (arms, wrists, hands, fingers): None, normal Lower (legs, knees, ankles, toes): None, normal, Trunk Movements Neck, shoulders, hips: None, normal, Overall Severity Severity of abnormal movements (highest score from questions above): None, normal Incapacitation due to abnormal movements: None, normal Patient's awareness of abnormal movements (rate only patient's report): No Awareness, Dental Status Current problems with teeth and/or dentures?: No Does patient usually wear dentures?: No  CIWA:  CIWA-Ar Total: 1 COWS:  COWS Total Score: 2  Psychiatric Specialty Exam: See Psychiatric Specialty Exam and Suicide Risk Assessment completed by Attending Physician prior to discharge.  Discharge destination:  Home  Is patient on multiple antipsychotic therapies at discharge:  No   Has Patient had three or more failed trials of antipsychotic monotherapy by history:  No  Recommended Plan for Multiple Antipsychotic Therapies: N/A  Discharge Orders   Future Orders Complete By Expires     Activity as tolerated - No restrictions  As directed         Medication List    TAKE these medications     Indication   FLUoxetine 40 MG capsule  Commonly known as:  PROZAC  Take 1 capsule  (40 mg total) by mouth daily. For depression   Indication:  Major Depressive Disorder     omeprazole 20 MG capsule  Commonly known as:  PRILOSEC  Take 1 capsule (20 mg total) by mouth daily.   Indication:  Gastroesophageal Reflux Disease with Current Symptoms     risperiDONE 0.25 MG tablet  Commonly known as:  RISPERDAL  Take 1 tablet (0.25 mg total) by mouth 2 (two) times daily. For mood control   Indication:  Easily Angered or Annoyed     sucralfate 1 GM/10ML suspension  Commonly known as:  CARAFATE  Take 10 mLs (1 g total) by mouth 4 (four) times daily.   Indication:  Gastroesophageal Reflux Disease     traZODone 50 MG tablet  Commonly known as:  DESYREL  Take 1 tablet (50 mg total) by mouth at bedtime. For sleep   Indication:  Trouble Sleeping, Major Depressive Disorder           Follow-up Information   Follow up with Tretha Sciara - [facility] On 02/12/2013. (Wednesday, Feb 12, 2013 at 2:00PM)    Contact information:   [address] [place], [place]  [address]  [phone]      Follow-up recommendations:  Activity:  Resume usual activities  Diet:  Regular  Comments:   Take all your medications as prescribed by your mental healthcare provider.  Report any adverse effects and or reactions from your medicines to your outpatient provider promptly.  Patient is instructed and cautioned to not engage in alcohol and or illegal drug use while on prescription medicines.  In the event of worsening symptoms, patient is instructed to call the crisis hotline, 911 and or go to the nearest ED for appropriate evaluation and treatment of symptoms.  Follow-up with your primary care provider for your other medical issues, concerns and or health care needs.     Total Discharge Time:  Greater than 30 minutes.  SignedFransisca Kaufmann NP-C 02/07/2013, 10:15 AM

## 2013-02-07 NOTE — Progress Notes (Signed)
Chaplain provided brief follow up support around pt discharge.   Pt expressed some anxiety, but was generally in good spirits and hopeful about seeing her children.  Expressed that she "has to try to be out in the world at some point."  Belva Crome MDiv

## 2013-02-07 NOTE — Progress Notes (Signed)
[facility] Adult Case Management Discharge Plan :  Will you be returning to the same living situation after discharge: Yes,  Patient is returning to her home. At discharge, do you have transportation home?:Yes,  Family will transport patient home. Do you have the ability to pay for your medications:  Yes, patient has Medicaid.  Release of information consent forms completed and in the chart;  Patient's signature needed at discharge.  Patient to Follow up at: Follow-up Information   Follow up with Tretha Sciara [facility] On 02/12/2013. (Wednesday, Feb 12, 2013 at 2:00PM)    Contact information:   [address] [place], [place]  [address]  [phone]      Patient denies SI/HI:   Patient no longer endorsing SI/HI or other thoughts of self harm.     Safety Planning and Suicide Prevention discussed: .Reviewed with all patients during discharge planning group  Cheronda Erck, Joesph July 02/07/2013, 12:57 PM

## 2013-02-07 NOTE — [facility] Group Notes (Signed)
[facility] LCSW Aftercare Discharge Planning Group Note   02/07/2013 1:00 PM  Participation Quality:  Appropriate  Mood/Affect:  Appropriate  Depression Rating:  7  Anxiety Rating:  8 ( report anxiety due to thoughts of discharging home but looking forward to going home).  Thoughts of Suicide:  No  Will you contract for safety?   NA  Current AVH:  No  Plan for Discharge/Comments:  Patient reports doing well today and looking forward to discharging home but having anxiety about discharging as well.  Patient advised family will transport her home.  Transportation Means: Patient has transportation.  Supports:  Patient has a good support system.   Klint Lezcano, Joesph July

## 2013-02-07 NOTE — Progress Notes (Signed)
Patient denies SI/HI, denies A/V hallucinations. Patient verbalizes understanding of discharge instructions, follow up care and prescriptions. Patient given all belongings from [facility] locker. Patient escorted out by staff, transported by family. 

## 2013-02-07 NOTE — [facility] Suicide Risk Assessment (Signed)
Suicide Risk Assessment  Discharge Assessment     Demographic Factors:  Female, african american  Mental Status Per Nursing Assessment::   On Admission:     Current Mental Status by Physician: Alert and oriented to 4. Denies AH/VH/SI/HI.  Loss Factors: Financial problems/change in socioeconomic status  Historical Factors: Impulsivity  Risk Reduction Factors:   Sense of responsibility to family and Positive coping skills or problem solving skills  Continued Clinical Symptoms:  Depression:   Recent sense of peace/wellbeing  Cognitive Features That Contribute To Risk:  Cognitively intact  Suicide Risk:  Minimal: No identifiable suicidal ideation.  Patients presenting with no risk factors but with morbid ruminations; may be classified as minimal risk based on the severity of the depressive symptoms  Discharge Diagnoses:   AXIS I:  Major Depression, Recurrent severe AXIS II:  Deferred AXIS III:   Past Medical History  Diagnosis Date  . Depression   . GERD (gastroesophageal reflux disease)    AXIS IV:  other psychosocial or environmental problems AXIS V:  61-70 mild symptoms  Plan Of Care/Follow-up recommendations:  Activity:  as tolerated Diet:  regular Follow up with outpatient appointments.  Is patient on multiple antipsychotic therapies at discharge:  No   Has Patient had three or more failed trials of antipsychotic monotherapy by history:  No  Recommended Plan for Multiple Antipsychotic Therapies: NA  Erica Osuna 02/07/2013, 9:31 AM

## 2013-02-07 NOTE — Progress Notes (Signed)
Adult Psychoeducational Group Note  Date:  02/07/2013 Time:  12:41 PM  Group Topic/Focus:  Stages of Change:   The focus of this group is to explain the stages of change and help patients identify changes they want to make upon discharge.  Participation Level:  Active  Participation Quality:  Appropriate, Attentive, Sharing and Supportive  Affect:  Appropriate  Cognitive:  Alert and Appropriate  Insight: Appropriate and Good  Engagement in Group:  Engaged  Modes of Intervention:  Discussion and Education  Additional Comments:  Pt was asked to fill out "Personal Plan for change" making her goal to surround herself with positive people. Pt also participated during the beginning of group helping to give an example of filling out the "Personal plan for change". 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 Pt stated "I want to go back because I love working with people and helping them. 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 Pt appears to be motivated.  Dalia Heading 02/07/2013, 12:41 PM

## 2013-02-07 NOTE — Progress Notes (Signed)
Nutrition Brief Note  Patient identified on the Malnutrition Screening Tool (MST) Report  Body mass index is 28.42 kg/(m^2). Patient meets criteria for overweight based on current BMI.   Current diet order is regular, patient is consuming approximately good% of meals at this time. Labs and medications reviewed.   Patient for d/c today.  Stated that she ate well here but poor intake prior to admit secondary to being busy and depressed.  Uses Ensure at home when intake poor.  Discussed healthy nutrition.  No nutrition interventions warranted at this time. If nutrition issues arise, please consult RD.   Oran Rein, RD, LDN Clinical Inpatient Dietitian Pager:  [phone] Weekend and after hours pager:  [phone]

## 2013-02-07 NOTE — Progress Notes (Signed)
[facility] Group Notes:  (Nursing/MHT/Case Management/Adjunct)  Date:  02/07/2013  Time:  1100  Type of Therapy:  Group Therapy  Participation Level:  Active  Participation Quality:  Appropriate, Attentive, Sharing and Supportive  Affect:  Appropriate  Cognitive:  Alert and Appropriate  Insight:  Appropriate and Good  Engagement in Group:  Engaged and Supportive  Modes of Intervention:  Activity and Socialization  Summary of Progress/Problems:  Laura Lopez 02/07/2013, 11:51 AM

## 2013-02-12 NOTE — Progress Notes (Addendum)
Patient Discharge Instructions:  After Visit Summary (AVS):   Faxed to:  02/12/13 Discharge Summary Note:   Faxed to:  02/12/13 Psychiatric Admission Assessment Note:   Faxed to:  02/12/13 Suicide Risk Assessment - Discharge Assessment:   Faxed to:  02/12/13 Faxed/Sent to the Next Level Care provider:  02/12/13 Faxed to [facility] @ [phone] Jerelene Redden, 02/12/2013, 3:16 PM

## 2013-02-27 ENCOUNTER — Encounter (HOSPITAL_COMMUNITY): Payer: Self-pay | Admitting: Emergency Medicine

## 2013-02-27 ENCOUNTER — Emergency Department (HOSPITAL_COMMUNITY)
Admission: EM | Admit: 2013-02-27 | Discharge: 2013-02-27 | Disposition: A | Discharge status: Home / Self Care | Payer: Medicaid Other | Attending: Emergency Medicine | Admitting: Emergency Medicine

## 2013-02-27 DIAGNOSIS — Z87891 Personal history of nicotine dependence: Secondary | ICD-10-CM | POA: Insufficient documentation

## 2013-02-27 DIAGNOSIS — K219 Gastro-esophageal reflux disease without esophagitis: Secondary | ICD-10-CM | POA: Insufficient documentation

## 2013-02-27 DIAGNOSIS — Z3202 Encounter for pregnancy test, result negative: Secondary | ICD-10-CM | POA: Insufficient documentation

## 2013-02-27 DIAGNOSIS — F329 Major depressive disorder, single episode, unspecified: Secondary | ICD-10-CM | POA: Insufficient documentation

## 2013-02-27 DIAGNOSIS — M545 Low back pain, unspecified: Secondary | ICD-10-CM | POA: Insufficient documentation

## 2013-02-27 DIAGNOSIS — R350 Frequency of micturition: Secondary | ICD-10-CM | POA: Insufficient documentation

## 2013-02-27 DIAGNOSIS — F3289 Other specified depressive episodes: Secondary | ICD-10-CM | POA: Insufficient documentation

## 2013-02-27 DIAGNOSIS — N898 Other specified noninflammatory disorders of vagina: Secondary | ICD-10-CM | POA: Insufficient documentation

## 2013-02-27 DIAGNOSIS — Z9851 Tubal ligation status: Secondary | ICD-10-CM | POA: Insufficient documentation

## 2013-02-27 DIAGNOSIS — G8929 Other chronic pain: Secondary | ICD-10-CM | POA: Insufficient documentation

## 2013-02-27 DIAGNOSIS — N760 Acute vaginitis: Secondary | ICD-10-CM | POA: Insufficient documentation

## 2013-02-27 DIAGNOSIS — N72 Inflammatory disease of cervix uteri: Secondary | ICD-10-CM

## 2013-02-27 HISTORY — DX: Other chronic pain: G89.29

## 2013-02-27 HISTORY — DX: Dorsalgia, unspecified: M54.9

## 2013-02-27 LAB — URINALYSIS, ROUTINE W REFLEX MICROSCOPIC
Glucose, UA: NEGATIVE mg/dL
Leukocytes, UA: NEGATIVE
Protein, ur: NEGATIVE mg/dL
Specific Gravity, Urine: >1.03 — ABNORMAL HIGH (ref 1.005–1.030)
pH: 6 (ref 5.0–8.0)

## 2013-02-27 LAB — WET PREP, GENITAL: Trich, Wet Prep: NONE SEEN

## 2013-02-27 LAB — PREGNANCY, URINE: Preg Test, Ur: NEGATIVE

## 2013-02-27 MED ORDER — NAPROXEN 250 MG PO TABS: 250.0000 mg | ORAL_TABLET | Freq: Two times a day (BID) | ORAL | Status: DC

## 2013-02-27 MED ORDER — METRONIDAZOLE 500 MG PO TABS: 500.0000 mg | ORAL_TABLET | Freq: Two times a day (BID) | ORAL | Status: DC

## 2013-02-27 MED ORDER — CEFTRIAXONE SODIUM 250 MG IJ SOLR
250.0000 mg | Freq: Once | INTRAMUSCULAR | Status: AC
  Administered 2013-02-27: 250 mg via INTRAMUSCULAR
  Filled 2013-02-27: qty 250

## 2013-02-27 MED ORDER — LIDOCAINE HCL (PF) 1 % IJ SOLN
INTRAMUSCULAR | Status: AC
  Administered 2013-02-27: 2.1 mL
  Filled 2013-02-27: qty 5

## 2013-02-27 MED ORDER — IBUPROFEN 400 MG PO TABS
400.0000 mg | ORAL_TABLET | Freq: Once | ORAL | Status: AC
  Administered 2013-02-27: 400 mg via ORAL
  Filled 2013-02-27: qty 1

## 2013-02-27 MED ORDER — AZITHROMYCIN 250 MG PO TABS
1000.0000 mg | ORAL_TABLET | Freq: Once | ORAL | Status: AC
  Administered 2013-02-27: 1000 mg via ORAL
  Filled 2013-02-27: qty 4

## 2013-02-27 NOTE — ED Notes (Signed)
Pt c/o lower back pain that radiates into sides and down legs x 3 days. Urinary frequency with small amount. Denies bowel changes.

## 2013-02-27 NOTE — ED Provider Notes (Signed)
History     CSN: [id number]  Arrival date & time 02/27/13  1511   First MD Initiated Contact with Patient 02/27/13 1532      Chief Complaint  Patient presents with  . Abdominal Pain  . Back Pain     HPI Pt was seen at 1550.  Per pt, c/o gradual onset and persistence of constant lower pelvic "pain" for the past 3 days. States the pain radiates into her lower back and front of her legs.  Has been associated with urinary frequency and vaginal discharge.  Denies fevers, no hematuria, no flank pain, no N/V/D, no CP/SOB, no rash, no tingling/numbness in extremities, no focal motor weakness.    Past Medical History  Diagnosis Date  . Depression   . GERD (gastroesophageal reflux disease)   . Chronic back pain     Past Surgical History  Procedure Laterality Date  . Tubal ligation    . Tonsillectomy       History  Substance Use Topics  . Smoking status: Former Games developer  . Smokeless tobacco: Not on file  . Alcohol Use: Yes     Comment: occ      Review of Systems ROS: Statement: All systems negative except as marked or noted in the HPI; Constitutional: Negative for fever and chills. ; ; Eyes: Negative for eye pain, redness and discharge. ; ; ENMT: Negative for ear pain, hoarseness, nasal congestion, sinus pressure and sore throat. ; ; Cardiovascular: Negative for chest pain, palpitations, diaphoresis, dyspnea and peripheral edema. ; ; Respiratory: Negative for cough, wheezing and stridor. ; ; Gastrointestinal: Negative for nausea, vomiting, diarrhea, abdominal pain, blood in stool, hematemesis, jaundice and rectal bleeding. . ; ; Genitourinary: +dysuria. Negative for flank pain and hematuria. ; ; Musculoskeletal: +LBP. Negative for neck pain. Negative for swelling and trauma.; ; GYN:  No vaginal bleeding, +pelvic pain, +vaginal discharge, no vulvar pain. ;; Skin: Negative for pruritus, rash, abrasions, blisters, bruising and skin lesion.; ; Neuro: Negative for headache, lightheadedness  and neck stiffness. Negative for weakness, altered level of consciousness , altered mental status, extremity weakness, paresthesias, involuntary movement, seizure and syncope.       Allergies  Review of patient's allergies indicates no known allergies.  Home Medications   Current Outpatient Rx  Name  Route  Sig  Dispense  Refill  . FLUoxetine (PROZAC) 40 MG capsule   Oral   Take 1 capsule (40 mg total) by mouth daily. For depression   30 capsule   0   . omeprazole (PRILOSEC) 20 MG capsule   Oral   Take 1 capsule (20 mg total) by mouth daily.   20 capsule   0   . risperiDONE (RISPERDAL) 0.25 MG tablet   Oral   Take 1 tablet (0.25 mg total) by mouth 2 (two) times daily. For mood control   60 tablet   0   . traZODone (DESYREL) 50 MG tablet   Oral   Take 1 tablet (50 mg total) by mouth at bedtime. For sleep   30 tablet   0     BP 121/58  Pulse 81  Temp(Src) 98.1 F (36.7 C) (Oral)  Ht 5\' 6"  (1.676 m)  Wt 176 lb (79.833 kg)  BMI 28.42 kg/m2  SpO2 100%  LMP 01/31/2013  Physical Exam 1555: Physical examination:  Nursing notes reviewed; Vital signs and O2 SAT reviewed;  Constitutional: Well developed, Well nourished, Well hydrated, In no acute distress; Head:  Normocephalic, atraumatic; Eyes: EOMI, PERRL,  No scleral icterus; ENMT: Mouth and pharynx normal, Mucous membranes moist; Neck: Supple, Full range of motion, No lymphadenopathy; Cardiovascular: Regular rate and rhythm, No murmur, rub, or gallop; Respiratory: Breath sounds clear & equal bilaterally, No rales, rhonchi, wheezes.  Speaking full sentences with ease, Normal respiratory effort/excursion; Chest: Nontender, Movement normal; Abdomen: Soft, +suprapubic tenderness to palp. No rebound or guarding. Nondistended, Normal bowel sounds; Spine:  No midline CS, TS, LS tenderness.;; Genitourinary: No CVA tenderness. Pelvic exam performed with permission of pt and female ED tech assist during exam.  External genitalia w/o  lesions. Vaginal vault with thick white discharge.  Cervix w/o lesions, not friable, GC/chlam and wet prep obtained and sent to lab.  Bimanual exam w/o CMT or adnexal tenderness. +suprapubic tenderness.;;;  Extremities: Pulses normal, No tenderness, No edema, No calf edema or asymmetry.; Neuro: AA&Ox3, Major CN grossly intact.  Speech clear. Climbs on and off stretcher easily by herself. Gait steady. No gross focal motor or sensory deficits in extremities.; Skin: Color normal, Warm, Dry.   ED Course  Procedures    MDM  MDM Reviewed: previous chart, nursing note and vitals Interpretation: labs   Results for orders placed during the hospital encounter of 02/27/13  WET PREP, GENITAL      Result Value Range   Yeast Wet Prep HPF POC NONE SEEN  NONE SEEN   Trich, Wet Prep NONE SEEN  NONE SEEN   Clue Cells Wet Prep HPF POC MODERATE (*) NONE SEEN   WBC, Wet Prep HPF POC FEW (*) NONE SEEN  URINALYSIS, ROUTINE W REFLEX MICROSCOPIC      Result Value Range   Color, Urine YELLOW  YELLOW   APPearance CLEAR  CLEAR   Specific Gravity, Urine >1.030 (*) 1.005 - 1.030   pH 6.0  5.0 - 8.0   Glucose, UA NEGATIVE  NEGATIVE mg/dL   Hgb urine dipstick NEGATIVE  NEGATIVE   Bilirubin Urine NEGATIVE  NEGATIVE   Ketones, ur NEGATIVE  NEGATIVE mg/dL   Protein, ur NEGATIVE  NEGATIVE mg/dL   Urobilinogen, UA 0.2  0.0 - 1.0 mg/dL   Nitrite NEGATIVE  NEGATIVE   Leukocytes, UA NEGATIVE  NEGATIVE  PREGNANCY, URINE      Result Value Range   Preg Test, Ur NEGATIVE  NEGATIVE     1720:  Will tx for cervicitis and BV.  GC/chlam pending. No UTI on Udip. Wants to go home now. Dx and testing d/w pt.  Questions answered.  Verb understanding, agreeable to d/c home with outpt f/u.              Laray Anger, DO 03/02/13 [phone]

## 2013-03-07 DIAGNOSIS — Z0000 Encounter for general adult medical examination without abnormal findings: Secondary | ICD-10-CM

## 2013-03-24 ENCOUNTER — Emergency Department (HOSPITAL_COMMUNITY)
Admission: EM | Admit: 2013-03-24 | Discharge: 2013-03-24 | Disposition: A | Discharge status: Home / Self Care | Payer: Medicaid Other | Attending: Emergency Medicine | Admitting: Emergency Medicine

## 2013-03-24 ENCOUNTER — Encounter (HOSPITAL_COMMUNITY): Payer: Self-pay

## 2013-03-24 ENCOUNTER — Emergency Department (HOSPITAL_COMMUNITY): Payer: Medicaid Other

## 2013-03-24 DIAGNOSIS — S93402A Sprain of unspecified ligament of left ankle, initial encounter: Secondary | ICD-10-CM

## 2013-03-24 DIAGNOSIS — Y92009 Unspecified place in unspecified non-institutional (private) residence as the place of occurrence of the external cause: Secondary | ICD-10-CM | POA: Insufficient documentation

## 2013-03-24 DIAGNOSIS — M549 Dorsalgia, unspecified: Secondary | ICD-10-CM | POA: Insufficient documentation

## 2013-03-24 DIAGNOSIS — F3289 Other specified depressive episodes: Secondary | ICD-10-CM | POA: Insufficient documentation

## 2013-03-24 DIAGNOSIS — Z87891 Personal history of nicotine dependence: Secondary | ICD-10-CM | POA: Insufficient documentation

## 2013-03-24 DIAGNOSIS — R296 Repeated falls: Secondary | ICD-10-CM | POA: Insufficient documentation

## 2013-03-24 DIAGNOSIS — K219 Gastro-esophageal reflux disease without esophagitis: Secondary | ICD-10-CM | POA: Insufficient documentation

## 2013-03-24 DIAGNOSIS — F329 Major depressive disorder, single episode, unspecified: Secondary | ICD-10-CM | POA: Insufficient documentation

## 2013-03-24 DIAGNOSIS — S93409A Sprain of unspecified ligament of unspecified ankle, initial encounter: Secondary | ICD-10-CM | POA: Insufficient documentation

## 2013-03-24 DIAGNOSIS — G8929 Other chronic pain: Secondary | ICD-10-CM | POA: Insufficient documentation

## 2013-03-24 DIAGNOSIS — Y9389 Activity, other specified: Secondary | ICD-10-CM | POA: Insufficient documentation

## 2013-03-24 MED ORDER — DICLOFENAC SODIUM 75 MG PO TBEC: 75.0000 mg | DELAYED_RELEASE_TABLET | Freq: Two times a day (BID) | ORAL | Status: DC

## 2013-03-24 MED ORDER — HYDROCODONE-ACETAMINOPHEN 5-325 MG PO TABS: 1.0000 | ORAL_TABLET | ORAL | Status: DC | PRN

## 2013-03-24 NOTE — ED Notes (Signed)
Pt called for ride home.  On telephone at present.

## 2013-03-24 NOTE — ED Notes (Signed)
D/c instructions given to pt, verbalized understanding of all, scripts given, pt walked out unaided to lobby.

## 2013-03-24 NOTE — ED Provider Notes (Signed)
Medical screening examination/treatment/procedure(s) were performed by non-physician practitioner and as supervising physician I was immediately available for consultation/collaboration.  Hurman Horn, MD 03/24/13 [phone]

## 2013-03-24 NOTE — ED Notes (Signed)
Pt reports falling off porch last night and injured left ankle

## 2013-03-24 NOTE — ED Provider Notes (Cosign Needed)
History     CSN: [id number]  Arrival date & time 03/24/13  1025   First MD Initiated Contact with Patient 03/24/13 1031      Chief Complaint  Patient presents with  . Ankle Pain    (Consider location/radiation/quality/duration/timing/severity/associated sxs/prior treatment) Patient is a 27 y.o. female presenting with ankle pain. The history is provided by the patient.  Ankle Pain Location:  Ankle Time since incident:  1 day Injury: yes   Mechanism of injury: fall   Mechanism of injury comment:  Pt fell off a porch Ankle location:  L ankle Pain details:    Quality:  Aching and throbbing   Radiates to:  Does not radiate   Severity:  Moderate   Onset quality:  Sudden   Duration:  1 day   Timing:  Constant   Progression:  Worsening Chronicity:  New Dislocation: no   Foreign body present:  No foreign bodies Prior injury to area:  No Relieved by:  Nothing Worsened by:  Bearing weight Ineffective treatments:  Ice and NSAIDs Associated symptoms: back pain and decreased ROM   Associated symptoms: no neck pain and no numbness   Risk factors: no frequent fractures and no known bone disorder     Past Medical History  Diagnosis Date  . Depression   . GERD (gastroesophageal reflux disease)   . Chronic back pain     Past Surgical History  Procedure Laterality Date  . Tubal ligation    . Tonsillectomy      No family history on file.  History  Substance Use Topics  . Smoking status: Former Games developer  . Smokeless tobacco: Not on file  . Alcohol Use: Yes     Comment: occ    OB History   Grav Para Term Preterm Abortions TAB SAB Ect Mult Living                  Review of Systems  Constitutional: Negative for activity change.       All ROS Neg except as noted in HPI  HENT: Negative for nosebleeds and neck pain.   Eyes: Negative for photophobia and discharge.  Respiratory: Negative for cough, shortness of breath and wheezing.   Cardiovascular: Negative for chest  pain and palpitations.  Gastrointestinal: Negative for abdominal pain and blood in stool.  Genitourinary: Negative for dysuria, frequency and hematuria.  Musculoskeletal: Positive for back pain. Negative for arthralgias.  Skin: Negative.   Neurological: Negative for dizziness, seizures and speech difficulty.  Psychiatric/Behavioral: Negative for hallucinations and confusion.       Depression    Allergies  Review of patient's allergies indicates no known allergies.  Home Medications   Current Outpatient Rx  Name  Route  Sig  Dispense  Refill  . FLUoxetine (PROZAC) 40 MG capsule   Oral   Take 1 capsule (40 mg total) by mouth daily. For depression   30 capsule   0   . metroNIDAZOLE (FLAGYL) 500 MG tablet   Oral   Take 1 tablet (500 mg total) by mouth 2 (two) times daily.   14 tablet   0   . naproxen (NAPROSYN) 250 MG tablet   Oral   Take 1 tablet (250 mg total) by mouth 2 (two) times daily with a meal.   14 tablet   0   . omeprazole (PRILOSEC) 20 MG capsule   Oral   Take 1 capsule (20 mg total) by mouth daily.   20 capsule   0   .  risperiDONE (RISPERDAL) 0.25 MG tablet   Oral   Take 1 tablet (0.25 mg total) by mouth 2 (two) times daily. For mood control   60 tablet   0   . traZODone (DESYREL) 50 MG tablet   Oral   Take 1 tablet (50 mg total) by mouth at bedtime. For sleep   30 tablet   0     BP 117/77  Pulse 83  Temp(Src) 98.3 F (36.8 C) (Oral)  Resp 20  Ht 5\' 6"  (1.676 m)  Wt 179 lb (81.194 kg)  BMI 28.91 kg/m2  SpO2 100%  LMP 03/02/2013  Physical Exam  Nursing note and vitals reviewed. Constitutional: She is oriented to person, place, and time. She appears well-developed and well-nourished.  Non-toxic appearance.  HENT:  Head: Normocephalic.  Right Ear: Tympanic membrane and external ear normal.  Left Ear: Tympanic membrane and external ear normal.  Eyes: EOM and lids are normal. Pupils are equal, round, and reactive to light.  Neck: Normal  range of motion. Neck supple. Carotid bruit is not present.  Cardiovascular: Normal rate, regular rhythm, normal heart sounds, intact distal pulses and normal pulses.   Pulmonary/Chest: Breath sounds normal. No respiratory distress.  Abdominal: Soft. Bowel sounds are normal. There is no tenderness. There is no guarding.  Musculoskeletal:  Pain and mild swelling of the lateral malleolus. No gross deformity.  Lymphadenopathy:       Head (right side): No submandibular adenopathy present.       Head (left side): No submandibular adenopathy present.    She has no cervical adenopathy.  Neurological: She is alert and oriented to person, place, and time. She has normal strength. No cranial nerve deficit or sensory deficit.  Skin: Skin is warm and dry.  Psychiatric: She has a normal mood and affect. Her speech is normal.    ED Course  Procedures (including critical care time)  Labs Reviewed - No data to display No results found.   No diagnosis found.    MDM  I have reviewed nursing notes, vital signs, and all appropriate lab and imaging results for this patient. The xray of the left ankle is negative for fx or dislocation. Exam does not reveal neurovascular changes.  Pt fitted with ASO splint and ice. Rx for voltaren and norco given. Work note to return 6/25 given.       Kathie Dike, PA-C 03/24/13 [address] [place], [place] 03/31/13 2129

## 2013-03-24 NOTE — ED Notes (Signed)
Warm blanket given, hobson pa to see pt.

## 2013-04-06 ENCOUNTER — Encounter (HOSPITAL_COMMUNITY): Payer: Self-pay | Admitting: Emergency Medicine

## 2013-04-06 ENCOUNTER — Emergency Department (HOSPITAL_COMMUNITY)
Admission: EM | Admit: 2013-04-06 | Discharge: 2013-04-06 | Disposition: A | Discharge status: Home / Self Care | Payer: Medicaid Other | Attending: Emergency Medicine | Admitting: Emergency Medicine

## 2013-04-06 DIAGNOSIS — F3289 Other specified depressive episodes: Secondary | ICD-10-CM | POA: Insufficient documentation

## 2013-04-06 DIAGNOSIS — Z8719 Personal history of other diseases of the digestive system: Secondary | ICD-10-CM | POA: Insufficient documentation

## 2013-04-06 DIAGNOSIS — Y939 Activity, unspecified: Secondary | ICD-10-CM | POA: Insufficient documentation

## 2013-04-06 DIAGNOSIS — Z79899 Other long term (current) drug therapy: Secondary | ICD-10-CM | POA: Insufficient documentation

## 2013-04-06 DIAGNOSIS — Y92009 Unspecified place in unspecified non-institutional (private) residence as the place of occurrence of the external cause: Secondary | ICD-10-CM | POA: Insufficient documentation

## 2013-04-06 DIAGNOSIS — G8929 Other chronic pain: Secondary | ICD-10-CM | POA: Insufficient documentation

## 2013-04-06 DIAGNOSIS — X503XXA Overexertion from repetitive movements, initial encounter: Secondary | ICD-10-CM | POA: Insufficient documentation

## 2013-04-06 DIAGNOSIS — F329 Major depressive disorder, single episode, unspecified: Secondary | ICD-10-CM | POA: Insufficient documentation

## 2013-04-06 DIAGNOSIS — S93409A Sprain of unspecified ligament of unspecified ankle, initial encounter: Secondary | ICD-10-CM | POA: Insufficient documentation

## 2013-04-06 DIAGNOSIS — Z87891 Personal history of nicotine dependence: Secondary | ICD-10-CM | POA: Insufficient documentation

## 2013-04-06 MED ORDER — DICLOFENAC SODIUM 75 MG PO TBEC: 75.0000 mg | DELAYED_RELEASE_TABLET | Freq: Two times a day (BID) | ORAL | Status: DC

## 2013-04-06 NOTE — ED Notes (Signed)
Pt seen here for ankle sprain on 6/23. Given brace. Pt reports ankle is no better and continues to swell.

## 2013-04-07 NOTE — ED Provider Notes (Signed)
Medical screening examination/treatment/procedure(s) were performed by non-physician practitioner and as supervising physician I was immediately available for consultation/collaboration.  Aimar Shrewsbury, MD 04/07/13 0819 

## 2013-04-07 NOTE — ED Provider Notes (Signed)
History    CSN: [id number] Arrival date & time 04/06/13  1032  First MD Initiated Contact with Patient 04/06/13 1044     Chief Complaint  Patient presents with  . Ankle Pain   (Consider location/radiation/quality/duration/timing/severity/associated sxs/prior Treatment) HPI Comments: Laura Lopez is a 27 y.o. Female presenting for further evaluation of left ankle sprain which occurred on 03/24/13 when she twisted it falling off a porch.  She describes persistent aching, throbbing pain and swelling without radiation when she attempts to weight bear.  She was treated with an aso at he previous visit which she states she has worn,  Although is not wearing at present.  She has been working in her job in a Medical sales representative, and finding it increasingly difficult to finish her shift due to pain and swelling.  She has completed her voltaren and norco that was prescribed at her initial visit.  Her xrays that day were negative for fracture or dislocation.  She has not seen her pcp in followup of this injury.     The history is provided by the patient.   Past Medical History  Diagnosis Date  . Depression   . GERD (gastroesophageal reflux disease)   . Chronic back pain    Past Surgical History  Procedure Laterality Date  . Tubal ligation    . Tonsillectomy     No family history on file. History  Substance Use Topics  . Smoking status: Former Games developer  . Smokeless tobacco: Not on file  . Alcohol Use: Yes     Comment: occ   OB History   Grav Para Term Preterm Abortions TAB SAB Ect Mult Living   4 3 3  1 1          Review of Systems  Musculoskeletal: Positive for joint swelling and arthralgias.  Skin: Negative for wound.  Neurological: Negative for weakness and numbness.    Allergies  Review of patient's allergies indicates no known allergies.  Home Medications   Current Outpatient Rx  Name  Route  Sig  Dispense  Refill  . ARIPiprazole (ABILIFY) 5 MG tablet    Oral   Take 5 mg by mouth daily.         . diclofenac (VOLTAREN) 75 MG EC tablet   Oral   Take 1 tablet (75 mg total) by mouth 2 (two) times daily.   12 tablet   0   . FLUoxetine (PROZAC) 40 MG capsule   Oral   Take 1 capsule (40 mg total) by mouth daily. For depression   30 capsule   0   . ibuprofen (ADVIL,MOTRIN) 200 MG tablet   Oral   Take 400 mg by mouth every 6 (six) hours as needed for pain.         Marland Kitchen diclofenac (VOLTAREN) 75 MG EC tablet   Oral   Take 1 tablet (75 mg total) by mouth 2 (two) times daily.   15 tablet   0    BP 124/67  Pulse 95  Temp(Src) 97.6 F (36.4 C) (Oral)  Ht 5\' 6"  (1.676 m)  Wt 176 lb (79.833 kg)  BMI 28.42 kg/m2  SpO2 100%  LMP 04/05/2013 Physical Exam  Constitutional: She appears well-developed and well-nourished.  HENT:  Head: Atraumatic.  Neck: Normal range of motion.  Cardiovascular:  Pulses equal bilaterally  Musculoskeletal: She exhibits edema and tenderness.       Left ankle: She exhibits swelling. She exhibits no ecchymosis, no deformity and normal  pulse. Tenderness. Lateral malleolus and CF ligament tenderness found. No head of 5th metatarsal and no proximal fibula tenderness found. Achilles tendon normal.  Neurological: She is alert. She has normal strength. She displays normal reflexes. No sensory deficit.  Equal strength  Skin: Skin is warm and dry.  Psychiatric: She has a normal mood and affect.    ED Course  Procedures (including critical care time) Labs Reviewed - No data to display No results found. 1. Ankle sprain and strain, left, subsequent encounter     MDM  Prior films from 6/23 reviewed.  No indication for repeat xrays on todays exam.  Pt was encouraged to continue wearing the aso at all times when no sleeping.  Added crutches to avoid weight bearing.  Elevation,  Heating pad 20 minutes several times daily.  She was referred to rehab for additional evaluation and PT tx of her ankle.  Burgess Amor,  PA-C 04/07/13 [phone]

## 2013-05-05 ENCOUNTER — Ambulatory Visit (HOSPITAL_COMMUNITY): Payer: Self-pay | Admitting: Physical Therapy

## 2013-05-13 ENCOUNTER — Ambulatory Visit (HOSPITAL_COMMUNITY): Payer: Self-pay | Admitting: Physical Therapy

## 2013-11-15 ENCOUNTER — Encounter (HOSPITAL_COMMUNITY): Payer: Self-pay | Admitting: Emergency Medicine

## 2013-11-15 ENCOUNTER — Emergency Department (HOSPITAL_COMMUNITY)
Admission: EM | Admit: 2013-11-15 | Discharge: 2013-11-15 | Disposition: A | Discharge status: Home / Self Care | Payer: Medicaid Other | Attending: Emergency Medicine | Admitting: Emergency Medicine

## 2013-11-15 ENCOUNTER — Emergency Department (HOSPITAL_COMMUNITY): Payer: Medicaid Other

## 2013-11-15 DIAGNOSIS — F3289 Other specified depressive episodes: Secondary | ICD-10-CM | POA: Insufficient documentation

## 2013-11-15 DIAGNOSIS — Z3202 Encounter for pregnancy test, result negative: Secondary | ICD-10-CM | POA: Insufficient documentation

## 2013-11-15 DIAGNOSIS — Z79899 Other long term (current) drug therapy: Secondary | ICD-10-CM | POA: Insufficient documentation

## 2013-11-15 DIAGNOSIS — Z87891 Personal history of nicotine dependence: Secondary | ICD-10-CM | POA: Insufficient documentation

## 2013-11-15 DIAGNOSIS — H9209 Otalgia, unspecified ear: Secondary | ICD-10-CM | POA: Insufficient documentation

## 2013-11-15 DIAGNOSIS — R6889 Other general symptoms and signs: Secondary | ICD-10-CM

## 2013-11-15 DIAGNOSIS — Z8719 Personal history of other diseases of the digestive system: Secondary | ICD-10-CM | POA: Insufficient documentation

## 2013-11-15 DIAGNOSIS — J111 Influenza due to unidentified influenza virus with other respiratory manifestations: Secondary | ICD-10-CM | POA: Insufficient documentation

## 2013-11-15 DIAGNOSIS — G8929 Other chronic pain: Secondary | ICD-10-CM | POA: Insufficient documentation

## 2013-11-15 DIAGNOSIS — F329 Major depressive disorder, single episode, unspecified: Secondary | ICD-10-CM | POA: Insufficient documentation

## 2013-11-15 LAB — POCT PREGNANCY, URINE: PREG TEST UR: NEGATIVE

## 2013-11-15 MED ORDER — BENZONATATE 100 MG PO CAPS
ORAL_CAPSULE | ORAL | Status: AC
  Filled 2013-11-15: qty 1

## 2013-11-15 MED ORDER — BENZONATATE 100 MG PO CAPS: 200.0000 mg | ORAL_CAPSULE | Freq: Three times a day (TID) | ORAL | Status: DC | PRN

## 2013-11-15 MED ORDER — BENZONATATE 100 MG PO CAPS
200.0000 mg | ORAL_CAPSULE | Freq: Once | ORAL | Status: AC
  Administered 2013-11-15: 200 mg via ORAL
  Filled 2013-11-15: qty 2

## 2013-11-15 NOTE — ED Notes (Signed)
Cough, congestion, ear pain, sore throat, chills. Symptoms began yesterday.

## 2013-11-15 NOTE — ED Provider Notes (Signed)
Medical screening examination/treatment/procedure(s) were performed by non-physician practitioner and as supervising physician I was immediately available for consultation/collaboration.  EKG Interpretation   None        Vedika Dumlao, MD 11/15/13 1441 

## 2013-11-15 NOTE — ED Notes (Addendum)
Full dose of tessalon not in ED pyxis. Pharmacy aware and reported would bring dose down. Pt and PA aware of delay.

## 2013-11-15 NOTE — ED Provider Notes (Signed)
CSN: [id number]     Arrival date & time 11/15/13  [id number] History   First MD Initiated Contact with Patient 11/15/13 [phone] [id number]     Chief Complaint  Patient presents with  . Cough     (Consider location/radiation/quality/duration/timing/severity/associated sxs/prior Treatment) HPI Comments: Laura Lopez is a 28 y.o. Female presenting with flu like symptoms which started yesterday.  She describes rather sudden onset of myalgias, cough,  Fever to 101 last night along with a dry cough, sore throat, right ear ache and nasal congestion with clear drainage.  She reports 2 episodes of post tussive emesis this morning.  She has taken tylenol cold/flu product this morning at 3 am.  She denies neck pain or stiffness, abdominal pain, diarrhea, shortness of breath and chest pain.     The history is provided by the patient.    Past Medical History  Diagnosis Date  . Depression   . GERD (gastroesophageal reflux disease)   . Chronic back pain    Past Surgical History  Procedure Laterality Date  . Tubal ligation    . Tonsillectomy     No family history on file. History  Substance Use Topics  . Smoking status: Former Games developermoker  . Smokeless tobacco: Not on file  . Alcohol Use: Yes     Comment: occ   OB History   Grav Para Term Preterm Abortions TAB SAB Ect Mult Living   4 3 3  1 1          Review of Systems  Constitutional: Positive for fever, chills and fatigue.  HENT: Positive for congestion, rhinorrhea and sore throat. Negative for ear pain, sinus pressure, trouble swallowing and voice change.   Eyes: Negative for discharge.  Respiratory: Positive for cough. Negative for shortness of breath, wheezing and stridor.   Cardiovascular: Negative for chest pain.  Gastrointestinal: Positive for vomiting. Negative for nausea, abdominal pain and diarrhea.  Genitourinary: Negative.   Musculoskeletal: Positive for myalgias. Negative for neck pain.  Skin: Negative for rash.      Allergies   Review of patient's allergies indicates no known allergies.  Home Medications   Current Outpatient Rx  Name  Route  Sig  Dispense  Refill  . FLUoxetine (PROZAC) 40 MG capsule   Oral   Take 1 capsule (40 mg total) by mouth daily. For depression   30 capsule   0   . Phenylephrine-DM-GG-APAP (TYLENOL COLD/FLU SEVERE) 5-10-200-325 MG TABS   Oral   Take 2 tablets by mouth every 8 (eight) hours as needed.         . ARIPiprazole (ABILIFY) 5 MG tablet   Oral   Take 5 mg by mouth daily.         . benzonatate (TESSALON) 100 MG capsule   Oral   Take 2 capsules (200 mg total) by mouth 3 (three) times daily as needed for cough.   21 capsule   0    BP 132/75  Pulse 109  Temp(Src) 99 F (37.2 C) (Oral)  Resp 19  Ht 5\' 6"  (1.676 m)  Wt 176 lb (79.833 kg)  BMI 28.42 kg/m2  SpO2 100%  LMP 10/27/2013 Physical Exam  Nursing note and vitals reviewed. Constitutional: She appears well-developed and well-nourished. No distress.  HENT:  Head: Normocephalic and atraumatic.  Right Ear: External ear normal.  Left Ear: External ear normal.  Nose: Mucosal edema and rhinorrhea present.  Mouth/Throat: Uvula is midline and oropharynx is clear and moist. No oropharyngeal exudate, posterior oropharyngeal  edema or posterior oropharyngeal erythema.  Eyes: Conjunctivae are normal.  Neck: Normal range of motion.  Cardiovascular: Normal rate, regular rhythm, normal heart sounds and intact distal pulses.   Pulmonary/Chest: Effort normal and breath sounds normal. No respiratory distress. She has no decreased breath sounds. She has no wheezes. She has no rhonchi. She has no rales.  Abdominal: Soft. Bowel sounds are normal. There is no tenderness. There is no guarding.  Musculoskeletal: Normal range of motion.  Neurological: She is alert.  Skin: Skin is warm and dry.  Psychiatric: She has a normal mood and affect.    ED Course  Procedures (including critical care time) Labs Review Labs Reviewed   POCT PREGNANCY, URINE   Imaging Review Dg Chest 2 View  11/15/2013   CLINICAL DATA:  Cough.  EXAM: CHEST  2 VIEW  COMPARISON:  02/13/2007  FINDINGS: The heart size and mediastinal contours are within normal limits. Both lungs are clear. The visualized skeletal structures are unremarkable.  IMPRESSION: No active cardiopulmonary disease.   Electronically Signed   By: Elberta Fortis M.D.   On: 11/15/2013 09:25    EKG Interpretation   None       MDM   Final diagnoses:  Flu-like symptoms    Patients labs and/or radiological studies were viewed and considered during the medical decision making and disposition process. Pt with flu like symptoms starting yesterday.  She maintained PO fluids in ed, no emesis.  Given tessalon for cough,  Prescribed same.  Encouraged rest,  Increased fluids,  Zyrtec d (pt has at home) for nasal congestion. Discussed role of tamiflu and mild improvement it may give - pt deferred this medicine.        Burgess Amor, PA-C 11/15/13 1328

## 2013-11-15 NOTE — Discharge Instructions (Signed)
Viral Infections A viral infection can be caused by different types of viruses.Most viral infections are not serious and resolve on their own. However, some infections may cause severe symptoms and may lead to further complications. SYMPTOMS Viruses can frequently cause:  Minor sore throat.  Aches and pains.  Headaches.  Runny nose.  Different types of rashes.  Watery eyes.  Tiredness.  Cough.  Loss of appetite.  Gastrointestinal infections, resulting in nausea, vomiting, and diarrhea. These symptoms do not respond to antibiotics because the infection is not caused by bacteria. However, you might catch a bacterial infection following the viral infection. This is sometimes called a "superinfection." Symptoms of such a bacterial infection may include:  Worsening sore throat with pus and difficulty swallowing.  Swollen neck glands.  Chills and a high or persistent fever.  Severe headache.  Tenderness over the sinuses.  Persistent overall ill feeling (malaise), muscle aches, and tiredness (fatigue).  Persistent cough.  Yellow, green, or brown mucus production with coughing. HOME CARE INSTRUCTIONS   Only take over-the-counter or prescription medicines for pain, discomfort, diarrhea, or fever as directed by your caregiver.  Drink enough water and fluids to keep your urine clear or pale yellow. Sports drinks can provide valuable electrolytes, sugars, and hydration.  Get plenty of rest and maintain proper nutrition. Soups and broths with crackers or rice are fine. SEEK IMMEDIATE MEDICAL CARE IF:   You have severe headaches, shortness of breath, chest pain, neck pain, or an unusual rash.  You have uncontrolled vomiting, diarrhea, or you are unable to keep down fluids.  You or your child has an oral temperature above 102 F (38.9 C), not controlled by medicine.  Your baby is older than 3 months with a rectal temperature of 102 F (38.9 C) or higher.  Your baby is 883  months old or younger with a rectal temperature of 100.4 F (38 C) or higher. MAKE SURE YOU:   Understand these instructions.  Will watch your condition.  Will get help right away if you are not doing well or get worse. Document Released: 06/28/2005 Document Revised: 12/11/2011 Document Reviewed: 01/23/2011 [facility] Patient Information 2014 [place], [place].   Rest,  Drink plenty of fluids.  Take motrin or tylenol for achiness and fever reduction.  Use the tessalon perles for cough reduction.  Also, take your zyrtec D for nasal congestion and drainage.  Get rechecked if you develop worse symptoms including shortness of breath, persistent fever or increasing weakness.

## 2014-08-03 ENCOUNTER — Encounter (HOSPITAL_COMMUNITY): Payer: Self-pay | Admitting: Emergency Medicine

## 2014-10-06 ENCOUNTER — Encounter (HOSPITAL_COMMUNITY): Payer: Self-pay | Admitting: Emergency Medicine

## 2014-10-06 ENCOUNTER — Emergency Department (HOSPITAL_COMMUNITY): Payer: Self-pay

## 2014-10-06 ENCOUNTER — Emergency Department (HOSPITAL_COMMUNITY)
Admission: EM | Admit: 2014-10-06 | Discharge: 2014-10-06 | Disposition: A | Discharge status: Home / Self Care | Payer: Self-pay | Attending: Emergency Medicine | Admitting: Emergency Medicine

## 2014-10-06 DIAGNOSIS — Z87891 Personal history of nicotine dependence: Secondary | ICD-10-CM | POA: Insufficient documentation

## 2014-10-06 DIAGNOSIS — F329 Major depressive disorder, single episode, unspecified: Secondary | ICD-10-CM | POA: Insufficient documentation

## 2014-10-06 DIAGNOSIS — Z79899 Other long term (current) drug therapy: Secondary | ICD-10-CM | POA: Insufficient documentation

## 2014-10-06 DIAGNOSIS — F411 Generalized anxiety disorder: Secondary | ICD-10-CM | POA: Insufficient documentation

## 2014-10-06 DIAGNOSIS — G8929 Other chronic pain: Secondary | ICD-10-CM | POA: Insufficient documentation

## 2014-10-06 DIAGNOSIS — R064 Hyperventilation: Secondary | ICD-10-CM | POA: Insufficient documentation

## 2014-10-06 DIAGNOSIS — J9801 Acute bronchospasm: Secondary | ICD-10-CM | POA: Insufficient documentation

## 2014-10-06 LAB — TROPONIN I: Troponin I: <0.03 ng/mL (ref <0.031)

## 2014-10-06 LAB — CBC WITH DIFFERENTIAL/PLATELET
BASOS ABS: 0 10*3/uL (ref 0.0–0.1)
BASOS PCT: 0 % (ref 0–1)
EOS ABS: 0.2 10*3/uL (ref 0.0–0.7)
EOS PCT: 3 % (ref 0–5)
HEMATOCRIT: 37.6 % (ref 36.0–46.0)
HEMOGLOBIN: 12.8 g/dL (ref 12.0–15.0)
LYMPHS ABS: 2.8 10*3/uL (ref 0.7–4.0)
Lymphocytes Relative: 30 % (ref 12–46)
MCH: 29.6 pg (ref 26.0–34.0)
MCHC: 34 g/dL (ref 30.0–36.0)
MCV: 86.8 fL (ref 78.0–100.0)
Monocytes Absolute: 0.8 10*3/uL (ref 0.1–1.0)
Monocytes Relative: 9 % (ref 3–12)
NEUTROS ABS: 5.4 10*3/uL (ref 1.7–7.7)
Neutrophils Relative %: 58 % (ref 43–77)
Platelets: 365 10*3/uL (ref 150–400)
RBC: 4.33 MIL/uL (ref 3.87–5.11)
RDW: 13.7 % (ref 11.5–15.5)
WBC: 9.2 10*3/uL (ref 4.0–10.5)

## 2014-10-06 LAB — BASIC METABOLIC PANEL
Anion gap: 6 (ref 5–15)
BUN: 6 mg/dL (ref 6–23)
CHLORIDE: 104 meq/L (ref 96–112)
CO2: 25 mmol/L (ref 19–32)
Calcium: 8.1 mg/dL — ABNORMAL LOW (ref 8.4–10.5)
Creatinine, Ser: 0.86 mg/dL (ref 0.50–1.10)
GFR calc non Af Amer: >90 mL/min (ref >90)
GLUCOSE: 92 mg/dL (ref 70–99)
Potassium: 3.3 mmol/L — ABNORMAL LOW (ref 3.5–5.1)
Sodium: 135 mmol/L (ref 135–145)

## 2014-10-06 MED ORDER — IPRATROPIUM-ALBUTEROL 0.5-2.5 (3) MG/3ML IN SOLN
3.0000 mL | Freq: Once | RESPIRATORY_TRACT | Status: AC
  Administered 2014-10-06: 3 mL via RESPIRATORY_TRACT
  Filled 2014-10-06: qty 3

## 2014-10-06 MED ORDER — ALBUTEROL SULFATE HFA 108 (90 BASE) MCG/ACT IN AERS: 2.0000 | INHALATION_SPRAY | RESPIRATORY_TRACT | Status: DC | PRN

## 2014-10-06 NOTE — ED Notes (Signed)
Pt. Reports she was at work and had sudden onset chest pain radiating to her back. Pt. Appears anxious and reports hx of panic attacks. EMS gave 1 nitro en route which brought pain from 8 down to a 4. Pt. Given 4 ASA. 22G IV to right hand saline locked.

## 2014-10-06 NOTE — ED Provider Notes (Signed)
CSN: [id number]     Arrival date & time 10/06/14  [id number] History   First MD Initiated Contact with Patient 10/06/14 [id number]     Chief Complaint  Patient presents with  . Chest Pain     (Consider location/radiation/quality/duration/timing/severity/associated sxs/prior Treatment) Patient is a 29 y.o. female presenting with chest pain. The history is provided by the patient.  Chest Pain She was at work when she developed a pressure feeling in her chest which radiated into her back. She rated it at 8/10. There is associated dyspnea but no nausea or diaphoresis. She felt some lightheadedness and dry mouth and numbness in her fingers. She has a history of panic attacks and states that this feels similar to those. EMS did give her aspirin and nitroglycerin with improvement but not complete resolution. Have note, she also has a cough which started yesterday. Cough is nonproductive. She denies fever or chills. She is a nonsmoker and denies history of hypertension, diabetes, hyperlipidemia.  Past Medical History  Diagnosis Date  . Depression   . GERD (gastroesophageal reflux disease)   . Chronic back pain    Past Surgical History  Procedure Laterality Date  . Tubal ligation    . Tonsillectomy    . Tubal ligation     No family history on file. History  Substance Use Topics  . Smoking status: Former Games developermoker  . Smokeless tobacco: Not on file  . Alcohol Use: No     Comment: occ   OB History    Gravida Para Term Preterm AB TAB SAB Ectopic Multiple Living   4 3 3  1 1          Review of Systems  Cardiovascular: Positive for chest pain.  All other systems reviewed and are negative.     Allergies  Review of patient's allergies indicates no known allergies.  Home Medications   Prior to Admission medications   Medication Sig Start Date End Date Taking? Authorizing Provider  ARIPiprazole (ABILIFY) 5 MG tablet Take 5 mg by mouth daily.    Historical Provider, MD  benzonatate (TESSALON) 100 MG  capsule Take 2 capsules (200 mg total) by mouth 3 (three) times daily as needed for cough. 11/15/13   Burgess AmorJulie Idol, PA-C  FLUoxetine (PROZAC) 40 MG capsule Take 1 capsule (40 mg total) by mouth daily. For depression 02/07/13   Fransisca KaufmannLaura Davis, NP  Phenylephrine-DM-GG-APAP (TYLENOL COLD/FLU SEVERE) 5-10-200-325 MG TABS Take 2 tablets by mouth every 8 (eight) hours as needed.    Historical Provider, MD   BP 138/84 mmHg  Pulse 89  Temp(Src) 97.9 F (36.6 C) (Oral)  Resp 20  Ht 5\' 6"  (1.676 m)  Wt 215 lb (97.523 kg)  BMI 34.72 kg/m2  SpO2 100%  LMP 09/27/2014 Physical Exam  Nursing note and vitals reviewed.  29 year old female, resting comfortably and in no acute distress. Vital signs are normal. Oxygen saturation is 100%, which is normal. Head is normocephalic and atraumatic. PERRLA, EOMI. Oropharynx is clear. Neck is nontender and supple without adenopathy or JVD. Back is nontender and there is no CVA tenderness. Lungs are clear without rales, wheezes, or rhonchi. Prolonged exhalation phase is noted. Chest is nontender. Heart has regular rate and rhythm without murmur. Abdomen is soft, flat, nontender without masses or hepatosplenomegaly and peristalsis is normoactive. Extremities have no cyanosis or edema, full range of motion is present. Skin is warm and dry without rash. Neurologic: Mental status is normal, cranial nerves are intact, there are no motor  or sensory deficits.  ED Course  Procedures (including critical care time) Labs Review Results for orders placed or performed during the hospital encounter of 10/06/14  Troponin I (MHP)  Result Value Ref Range   Troponin I <0.03 <0.031 ng/mL  Basic metabolic panel  Result Value Ref Range   Sodium 135 135 - 145 mmol/L   Potassium 3.3 (L) 3.5 - 5.1 mmol/L   Chloride 104 96 - 112 mEq/L   CO2 25 19 - 32 mmol/L   Glucose, Bld 92 70 - 99 mg/dL   BUN 6 6 - 23 mg/dL   Creatinine, Ser 1.61 0.50 - 1.10 mg/dL   Calcium 8.1 (L) 8.4 - 10.5  mg/dL   GFR calc non Af Amer >90 >90 mL/min   GFR calc Af Amer >90 >90 mL/min   Anion gap 6 5 - 15  CBC with Differential  Result Value Ref Range   WBC 9.2 4.0 - 10.5 K/uL   RBC 4.33 3.87 - 5.11 MIL/uL   Hemoglobin 12.8 12.0 - 15.0 g/dL   HCT 09.6 04.5 - 40.9 %   MCV 86.8 78.0 - 100.0 fL   MCH 29.6 26.0 - 34.0 pg   MCHC 34.0 30.0 - 36.0 g/dL   RDW 81.1 91.4 - 78.2 %   Platelets 365 150 - 400 K/uL   Neutrophils Relative % 58 43 - 77 %   Neutro Abs 5.4 1.7 - 7.7 K/uL   Lymphocytes Relative 30 12 - 46 %   Lymphs Abs 2.8 0.7 - 4.0 K/uL   Monocytes Relative 9 3 - 12 %   Monocytes Absolute 0.8 0.1 - 1.0 K/uL   Eosinophils Relative 3 0 - 5 %   Eosinophils Absolute 0.2 0.0 - 0.7 K/uL   Basophils Relative 0 0 - 1 %   Basophils Absolute 0.0 0.0 - 0.1 K/uL   Imaging Review Dg Chest Port 1 View  10/06/2014   CLINICAL DATA:  Cough and chest tightness this morning. Chronic back pain.  EXAM: PORTABLE CHEST - 1 VIEW  COMPARISON:  11/15/2013  FINDINGS: The heart size and mediastinal contours are within normal limits. Both lungs are clear. The visualized skeletal structures are unremarkable.  IMPRESSION: No active disease.   Electronically Signed   By: Burman Nieves M.D.   On: 10/06/2014 05:27     EKG Interpretation   Date/Time:  Tuesday October 06 2014 04:44:49 EST Ventricular Rate:  95 PR Interval:  127 QRS Duration: 85 QT Interval:  372 QTC Calculation: 468 R Axis:   37 Text Interpretation:  Sinus rhythm Baseline wander in lead(s) V3 V5  Otherwise within normal limits When compared with ECG of 02/13/2007, No  significant change was found Confirmed by [facility]  MD, Christella App ([id number]) on  10/06/2014 4:53:00 AM      MDM   Final diagnoses:  Bronchospasm  Hyperventilation    Dyspnea and chest tightness, which I suspect is related to bronchospasm. Remainder symptoms are consistent with hyperventilation. ECG is unremarkable. She will be given therapeutic trial of albuterol with ipratropium.  Chest x-ray will be checked.  Chest x-ray shows no acute process. She feels better after nebulizer treatment. She is discharged with prescription for albuterol inhaler.  Dione Booze, MD 10/06/14 [phone]

## 2014-10-06 NOTE — Discharge Instructions (Signed)
Bronchospasm A bronchospasm is a spasm or tightening of the airways going into the lungs. During a bronchospasm breathing becomes more difficult because the airways get smaller. When this happens there can be coughing, a whistling sound when breathing (wheezing), and difficulty breathing. Bronchospasm is often associated with asthma, but not all patients who experience a bronchospasm have asthma. CAUSES  A bronchospasm is caused by inflammation or irritation of the airways. The inflammation or irritation may be triggered by:   Allergies (such as to animals, pollen, food, or mold). Allergens that cause bronchospasm may cause wheezing immediately after exposure or many hours later.   Infection. Viral infections are believed to be the most common cause of bronchospasm.   Exercise.   Irritants (such as pollution, cigarette smoke, strong odors, aerosol sprays, and paint fumes).   Weather changes. Winds increase molds and pollens in the air. Rain refreshes the air by washing irritants out. Cold air may cause inflammation.   Stress and emotional upset.  SIGNS AND SYMPTOMS   Wheezing.   Excessive nighttime coughing.   Frequent or severe coughing with a simple cold.   Chest tightness.   Shortness of breath.  DIAGNOSIS  Bronchospasm is usually diagnosed through a history and physical exam. Tests, such as chest X-rays, are sometimes done to look for other conditions. TREATMENT   Inhaled medicines can be given to open up your airways and help you breathe. The medicines can be given using either an inhaler or a nebulizer machine.  Corticosteroid medicines may be given for severe bronchospasm, usually when it is associated with asthma. HOME CARE INSTRUCTIONS   Always have a plan prepared for seeking medical care. Know when to call your health care provider and local emergency services (911 in the [place] Know where you can access local emergency care.  Only take medicines as  directed by your health care provider.  If you were prescribed an inhaler or nebulizer machine, ask your health care provider to explain how to use it correctly. Always use a spacer with your inhaler if you were given one.  It is necessary to remain calm during an attack. Try to relax and breathe more slowly.  Control your home environment in the following ways:   Change your heating and air conditioning filter at least once a month.   Limit your use of fireplaces and wood stoves.  Do not smoke and do not allow smoking in your home.   Avoid exposure to perfumes and fragrances.   Get rid of pests (such as roaches and mice) and their droppings.   Throw away plants if you see mold on them.   Keep your house clean and dust free.   Replace carpet with wood, tile, or vinyl flooring. Carpet can trap dander and dust.   Use allergy-proof pillows, mattress covers, and box spring covers.   Wash bed sheets and blankets every week in hot water and dry them in a dryer.   Use blankets that are made of polyester or cotton.   Wash hands frequently. SEEK MEDICAL CARE IF:   You have muscle aches.   You have chest pain.   The sputum changes from clear or white to yellow, green, gray, or bloody.   The sputum you cough up gets thicker.   There are problems that may be related to the medicine you are given, such as a rash, itching, swelling, or trouble breathing.  SEEK IMMEDIATE MEDICAL CARE IF:   You have worsening wheezing and coughing even  after taking your prescribed medicines.   You have increased difficulty breathing.   You develop severe chest pain. MAKE SURE YOU:   Understand these instructions.  Will watch your condition.  Will get help right away if you are not doing well or get worse. Document Released: 09/21/2003 Document Revised: 09/23/2013 Document Reviewed: 03/10/2013 [facility] Patient Information 2015 [place], [place]. This information is not  intended to replace advice given to you by your health care provider. Make sure you discuss any questions you have with your health care provider.  Hyperventilation Hyperventilation is breathing that is deeper and more rapid than normal. It is usually associated with panic and anxiety. Hyperventilation can make you feel breathless. It is sometimes called overbreathing. Breathing out too much causes a decrease in the amount of carbon dioxide gas in the blood. This leads to tingling and numbness in the hands, feet, and around the mouth. If this continues, your fingers, hands, and toes may begin to spasm. Hyperventilation usually lasts 20-30 minutes and can be associated with other symptoms of panic and anxiety, including:   Chest pains or tightness.  A pounding or irregular, racing heartbeat (palpitations).  Dizziness.  Lightheadedness.  Dry mouth.  Weakness.  Confusion.  Sleep disturbance. CAUSES  Sudden onset (acute) hyperventilation is usually triggered by acute stress, anxiety, or emotional upset. Long-term (chronic) and recurring hyperventilation can occur with chronic lung problems, such emphysema or asthma. Other causes include:   Nervousness.  Stress.  Stimulant, drug, or alcohol use.  Lung disease.  Infections, such as pneumonia.  Heart problems.  Severe pain.  Waking from a bad dream.  Pregnancy.  Bleeding. HOME CARE INSTRUCTIONS  Learn and use breathing exercises that help you breathe from your diaphragm and abdomen.  Practice relaxation techniques to reduce stress, such as visualization, meditation, and muscle release.  During an attack, try breathing into a paper bag. This changes the carbon dioxide level and slows down breathing. SEEK IMMEDIATE MEDICAL CARE IF:  Your hyperventilation continues or gets worse. MAKE SURE YOU:  Understand these instructions.  Will watch your condition.  Will get help right away if you are not doing well or get  worse. Document Released: 09/15/2000 Document Revised: 03/19/2012 Document Reviewed: 12/28/2011 [facility] Patient Information 2015 [place], [place]. This information is not intended to replace advice given to you by your health care provider. Make sure you discuss any questions you have with your health care provider.  Albuterol inhalation aerosol What is this medicine? ALBUTEROL (al Gaspar Bidding) is a bronchodilator. It helps open up the airways in your lungs to make it easier to breathe. This medicine is used to treat and to prevent bronchospasm. This medicine may be used for other purposes; ask your health care provider or pharmacist if you have questions. COMMON BRAND NAME(S): Proair HFA, Proventil, Proventil HFA, Respirol, Ventolin, Ventolin HFA What should I tell my health care provider before I take this medicine? They need to know if you have any of the following conditions: -diabetes -heart disease or irregular heartbeat -high blood pressure -pheochromocytoma -seizures -thyroid disease -an unusual or allergic reaction to albuterol, levalbuterol, sulfites, other medicines, foods, dyes, or preservatives -pregnant or trying to get pregnant -breast-feeding How should I use this medicine? This medicine is for inhalation through the mouth. Follow the directions on your prescription label. Take your medicine at regular intervals. Do not use more often than directed. Make sure that you are using your inhaler correctly. Ask you doctor or health care provider if  you have any questions. Talk to your pediatrician regarding the use of this medicine in children. Special care may be needed. Overdosage: If you think you have taken too much of this medicine contact a poison control center or emergency room at once. NOTE: This medicine is only for you. Do not share this medicine with others. What if I miss a dose? If you miss a dose, use it as soon as you can. If it is almost time for your next dose,  use only that dose. Do not use double or extra doses. What may interact with this medicine? -anti-infectives like chloroquine and pentamidine -caffeine -cisapride -diuretics -medicines for colds -medicines for depression or for emotional or psychotic conditions -medicines for weight loss including some herbal products -methadone -some antibiotics like clarithromycin, erythromycin, levofloxacin, and linezolid -some heart medicines -steroid hormones like dexamethasone, cortisone, hydrocortisone -theophylline -thyroid hormones This list may not describe all possible interactions. Give your health care provider a list of all the medicines, herbs, non-prescription drugs, or dietary supplements you use. Also tell them if you smoke, drink alcohol, or use illegal drugs. Some items may interact with your medicine. What should I watch for while using this medicine? Tell your doctor or health care professional if your symptoms do not improve. Do not use extra albuterol. If your asthma or bronchitis gets worse while you are using this medicine, call your doctor right away. If your mouth gets dry try chewing sugarless gum or sucking hard candy. Drink water as directed. What side effects may I notice from receiving this medicine? Side effects that you should report to your doctor or health care professional as soon as possible: -allergic reactions like skin rash, itching or hives, swelling of the face, lips, or tongue -breathing problems -chest pain -feeling faint or lightheaded, falls -high blood pressure -irregular heartbeat -fever -muscle cramps or weakness -pain, tingling, numbness in the hands or feet -vomiting Side effects that usually do not require medical attention (report to your doctor or health care professional if they continue or are bothersome): -cough -difficulty sleeping -headache -nervousness or trembling -stomach upset -stuffy or runny nose -throat irritation -unusual  taste This list may not describe all possible side effects. Call your doctor for medical advice about side effects. You may report side effects to FDA at 1-800-FDA-1088. Where should I keep my medicine? Keep out of the reach of children. Store at room temperature between 15 and 30 degrees C (59 and 86 degrees F). The contents are under pressure and may burst when exposed to heat or flame. Do not freeze. This medicine does not work as well if it is too cold. Throw away any unused medicine after the expiration date. Inhalers need to be thrown away after the labeled number of puffs have been used or by the expiration date; whichever comes first. Ventolin HFA should be thrown away 12 months after removing from foil pouch. Check the instructions that come with your medicine. NOTE: This sheet is a summary. It may not cover all possible information. If you have questions about this medicine, talk to your doctor, pharmacist, or health care provider.  2015, [employer]. (2013-03-06 10:57:17)

## 2015-04-22 ENCOUNTER — Encounter (HOSPITAL_COMMUNITY): Payer: Self-pay

## 2015-04-22 ENCOUNTER — Emergency Department (HOSPITAL_COMMUNITY)
Admission: EM | Admit: 2015-04-22 | Discharge: 2015-04-22 | Disposition: A | Discharge status: Home / Self Care | Payer: Self-pay | Attending: Emergency Medicine | Admitting: Emergency Medicine

## 2015-04-22 DIAGNOSIS — G8929 Other chronic pain: Secondary | ICD-10-CM | POA: Insufficient documentation

## 2015-04-22 DIAGNOSIS — Z87891 Personal history of nicotine dependence: Secondary | ICD-10-CM | POA: Insufficient documentation

## 2015-04-22 DIAGNOSIS — F329 Major depressive disorder, single episode, unspecified: Secondary | ICD-10-CM | POA: Insufficient documentation

## 2015-04-22 DIAGNOSIS — R531 Weakness: Secondary | ICD-10-CM | POA: Insufficient documentation

## 2015-04-22 DIAGNOSIS — Z8719 Personal history of other diseases of the digestive system: Secondary | ICD-10-CM | POA: Insufficient documentation

## 2015-04-22 DIAGNOSIS — N390 Urinary tract infection, site not specified: Secondary | ICD-10-CM | POA: Insufficient documentation

## 2015-04-22 DIAGNOSIS — Z79899 Other long term (current) drug therapy: Secondary | ICD-10-CM | POA: Insufficient documentation

## 2015-04-22 DIAGNOSIS — Z3202 Encounter for pregnancy test, result negative: Secondary | ICD-10-CM | POA: Insufficient documentation

## 2015-04-22 DIAGNOSIS — R112 Nausea with vomiting, unspecified: Secondary | ICD-10-CM

## 2015-04-22 LAB — COMPREHENSIVE METABOLIC PANEL
ALBUMIN: 3.5 g/dL (ref 3.5–5.0)
ALT: 21 U/L (ref 14–54)
ANION GAP: 7 (ref 5–15)
AST: 18 U/L (ref 15–41)
Alkaline Phosphatase: 67 U/L (ref 38–126)
BUN: 11 mg/dL (ref 6–20)
CALCIUM: 8.5 mg/dL — AB (ref 8.9–10.3)
CO2: 25 mmol/L (ref 22–32)
CREATININE: 0.74 mg/dL (ref 0.44–1.00)
Chloride: 105 mmol/L (ref 101–111)
GFR calc Af Amer: >60 mL/min (ref >60)
GLUCOSE: 88 mg/dL (ref 65–99)
POTASSIUM: 3.6 mmol/L (ref 3.5–5.1)
Sodium: 137 mmol/L (ref 135–145)
Total Bilirubin: 0.3 mg/dL (ref 0.3–1.2)
Total Protein: 7 g/dL (ref 6.5–8.1)

## 2015-04-22 LAB — URINALYSIS, ROUTINE W REFLEX MICROSCOPIC
Bilirubin Urine: NEGATIVE
Glucose, UA: NEGATIVE mg/dL
Ketones, ur: NEGATIVE mg/dL
NITRITE: NEGATIVE
Protein, ur: NEGATIVE mg/dL
Specific Gravity, Urine: 1.025 (ref 1.005–1.030)
UROBILINOGEN UA: 0.2 mg/dL (ref 0.0–1.0)
pH: 6 (ref 5.0–8.0)

## 2015-04-22 LAB — CBC WITH DIFFERENTIAL/PLATELET
Basophils Absolute: 0 10*3/uL (ref 0.0–0.1)
Basophils Relative: 0 % (ref 0–1)
EOS ABS: 0.2 10*3/uL (ref 0.0–0.7)
Eosinophils Relative: 2 % (ref 0–5)
HCT: 37.6 % (ref 36.0–46.0)
Hemoglobin: 13 g/dL (ref 12.0–15.0)
Lymphocytes Relative: 30 % (ref 12–46)
Lymphs Abs: 2.6 10*3/uL (ref 0.7–4.0)
MCH: 30.2 pg (ref 26.0–34.0)
MCHC: 34.6 g/dL (ref 30.0–36.0)
MCV: 87.2 fL (ref 78.0–100.0)
MONO ABS: 0.6 10*3/uL (ref 0.1–1.0)
Monocytes Relative: 7 % (ref 3–12)
NEUTROS ABS: 5.4 10*3/uL (ref 1.7–7.7)
NEUTROS PCT: 61 % (ref 43–77)
Platelets: 350 10*3/uL (ref 150–400)
RBC: 4.31 MIL/uL (ref 3.87–5.11)
RDW: 13.3 % (ref 11.5–15.5)
WBC: 8.8 10*3/uL (ref 4.0–10.5)

## 2015-04-22 LAB — PREGNANCY, URINE: Preg Test, Ur: NEGATIVE

## 2015-04-22 LAB — LIPASE, BLOOD: Lipase: 18 U/L — ABNORMAL LOW (ref 22–51)

## 2015-04-22 LAB — URINE MICROSCOPIC-ADD ON

## 2015-04-22 MED ORDER — CEPHALEXIN 500 MG PO CAPS: 500.0000 mg | ORAL_CAPSULE | Freq: Four times a day (QID) | ORAL | Status: DC

## 2015-04-22 MED ORDER — SODIUM CHLORIDE 0.9 % IV BOLUS (SEPSIS)
1000.0000 mL | Freq: Once | INTRAVENOUS | Status: AC
  Administered 2015-04-22: 1000 mL via INTRAVENOUS

## 2015-04-22 MED ORDER — ONDANSETRON HCL 4 MG PO TABS: 4.0000 mg | ORAL_TABLET | Freq: Four times a day (QID) | ORAL | Status: DC

## 2015-04-22 MED ORDER — ONDANSETRON HCL 4 MG/2ML IJ SOLN
4.0000 mg | Freq: Once | INTRAMUSCULAR | Status: AC
  Administered 2015-04-22: 4 mg via INTRAVENOUS
  Filled 2015-04-22: qty 2

## 2015-04-22 NOTE — ED Notes (Signed)
Pt made aware to return if symptoms worsen or if any life threatening symptoms occur.   

## 2015-04-22 NOTE — ED Provider Notes (Signed)
CSN: [id number]     Arrival date & time 04/22/15  4098 History   First MD Initiated Contact with Patient 04/22/15 [phone]     Chief Complaint  Patient presents with  . Emesis     (Consider location/radiation/quality/duration/timing/severity/associated sxs/prior Treatment)  HPI   Laura Lopez is a 29 y.o. female who presents to the Emergency Department complaining of sudden onset of vomiting at 1:30 am this morning and then again just prior to arrival.  She states she vomited approximately 5 times last evening was feeling "weak" but went to work and developed vomiting again.  She also c/o general weakness and frontal headache.  She describes a diffuse, crampy abdominal pain and has been unable to keep down food or liquids thsi morning.  She denies recent illness, dysuria, fever, chills, and coffee ground emesis.  She admits to hx of GERD and takes Prilosec daily.     Past Medical History  Diagnosis Date  . Depression   . GERD (gastroesophageal reflux disease)   . Chronic back pain    Past Surgical History  Procedure Laterality Date  . Tubal ligation    . Tonsillectomy    . Tubal ligation     No family history on file. History  Substance Use Topics  . Smoking status: Former Games developer  . Smokeless tobacco: Not on file  . Alcohol Use: No     Comment: occ   OB History    Gravida Para Term Preterm AB TAB SAB Ectopic Multiple Living   4 3 3  1 1          Review of Systems  Constitutional: Negative for fever, chills and appetite change.  HENT: Negative for trouble swallowing.   Respiratory: Negative for shortness of breath.   Cardiovascular: Negative for chest pain.  Gastrointestinal: Positive for nausea, vomiting and abdominal pain. Negative for diarrhea, blood in stool and abdominal distention.  Genitourinary: Negative for dysuria, flank pain, decreased urine volume and difficulty urinating.  Musculoskeletal: Negative for back pain.  Skin: Negative for color change and  rash.  Neurological: Positive for weakness. Negative for dizziness, syncope and numbness.  Hematological: Negative for adenopathy.  All other systems reviewed and are negative.     Allergies  Review of patient's allergies indicates no known allergies.  Home Medications   Prior to Admission medications   Medication Sig Start Date End Date Taking? Authorizing Provider  albuterol (PROVENTIL HFA;VENTOLIN HFA) 108 (90 BASE) MCG/ACT inhaler Inhale 2 puffs into the lungs every 4 (four) hours as needed for wheezing or shortness of breath (or coughing). 10/06/14   Dione Booze, MD  ARIPiprazole (ABILIFY) 5 MG tablet Take 5 mg by mouth daily.    Historical Provider, MD  benzonatate (TESSALON) 100 MG capsule Take 2 capsules (200 mg total) by mouth 3 (three) times daily as needed for cough. 11/15/13   Burgess Amor, PA-C  FLUoxetine (PROZAC) 40 MG capsule Take 1 capsule (40 mg total) by mouth daily. For depression 02/07/13   Thermon Leyland, NP  Phenylephrine-DM-GG-APAP (TYLENOL COLD/FLU SEVERE) 5-10-200-325 MG TABS Take 2 tablets by mouth every 8 (eight) hours as needed.    Historical Provider, MD   BP 126/75 mmHg  Pulse 89  Temp(Src) 98 F (36.7 C) (Oral)  Resp 18  Ht 5\' 6"  (1.676 m)  Wt 204 lb (92.534 kg)  BMI 32.94 kg/m2  SpO2 99% Physical Exam  Constitutional: She is oriented to person, place, and time. She appears well-developed and well-nourished. No distress.  HENT:  Head: Atraumatic.  Mouth/Throat: Oropharynx is clear and moist.  Neck: Normal range of motion. Neck supple.  Cardiovascular: Normal rate, regular rhythm, normal heart sounds and intact distal pulses.   No murmur heard. Pulmonary/Chest: Effort normal and breath sounds normal. No respiratory distress.  Abdominal: Soft. Normal appearance. She exhibits no distension and no mass. There is no splenomegaly or hepatomegaly. There is generalized tenderness. There is no rigidity, no rebound, no guarding, no CVA tenderness and no tenderness  at McBurney's point.  Mild generalized abdominal tenderness.  No guarding or rebound tenderness.    Musculoskeletal: Normal range of motion.  Lymphadenopathy:    She has no cervical adenopathy.  Neurological: She is alert and oriented to person, place, and time. She exhibits normal muscle tone. Coordination normal.  Skin: Skin is warm and dry.  Psychiatric: She has a normal mood and affect.  Nursing note and vitals reviewed.   ED Course  Procedures (including critical care time) Labs Review Labs Reviewed  URINALYSIS, ROUTINE W REFLEX MICROSCOPIC (NOT AT [facility]) - Abnormal; Notable for the following:    Hgb urine dipstick LARGE (*)    Leukocytes, UA SMALL (*)    All other components within normal limits  COMPREHENSIVE METABOLIC PANEL - Abnormal; Notable for the following:    Calcium 8.5 (*)    All other components within normal limits  LIPASE, BLOOD - Abnormal; Notable for the following:    Lipase 18 (*)    All other components within normal limits  URINE MICROSCOPIC-ADD ON - Abnormal; Notable for the following:    Squamous Epithelial / LPF FEW (*)    Bacteria, UA MANY (*)    All other components within normal limits  URINE CULTURE  PREGNANCY, URINE  CBC WITH DIFFERENTIAL/PLATELET    Imaging Review No results found.   EKG Interpretation None     Urine culture pending MDM   Final diagnoses:  Non-intractable vomiting with nausea, vomiting of unspecified type  UTI (lower urinary tract infection)    Pt is well appearing, non-toxic.  Vitals are stable.  No concerning sx's for acute abdomen.  Will check labs and provide tx with IVF's and anti-emetic.  Reviewed labs.  UTI present, no reported dysuria sx's, CVA tenderness or fever.  Afebrile.  Urine culture pending.  Pt is feeling better after treatment.  Talking with family member at bedside.  No further vomiting and reports resolution of crampy abd pain.  She states she is ready for d/c.    Pauline Aus, PA-C 04/23/15  2057  Margarita Grizzle, MD 04/24/15 [phone]

## 2015-04-22 NOTE — ED Notes (Signed)
Pt states she felt weak when she got up this morning. States she went to work and it was very hot at work. Sates she has been vomiting and a headache since.

## 2015-04-22 NOTE — Discharge Instructions (Signed)
Nausea and Vomiting °Nausea means you feel sick to your stomach. Throwing up (vomiting) is a reflex where stomach contents come out of your mouth. °HOME CARE  °· Take medicine as told by your doctor. °· Do not force yourself to eat. However, you do need to drink fluids. °· If you feel like eating, eat a normal diet as told by your doctor. °· Eat rice, wheat, potatoes, bread, lean meats, yogurt, fruits, and vegetables. °· Avoid high-fat foods. °· Drink enough fluids to keep your pee (urine) clear or pale yellow. °· Ask your doctor how to replace body fluid losses (rehydrate). Signs of body fluid loss (dehydration) include: °· Feeling very thirsty. °· Dry lips and mouth. °· Feeling dizzy. °· Dark pee. °· Peeing less than normal. °· Feeling confused. °· Fast breathing or heart rate. °GET HELP RIGHT AWAY IF:  °· You have blood in your throw up. °· You have black or bloody poop (stool). °· You have a bad headache or stiff neck. °· You feel confused. °· You have bad belly (abdominal) pain. °· You have chest pain or trouble breathing. °· You do not pee at least once every 8 hours. °· You have cold, clammy skin. °· You keep throwing up after 24 to 48 hours. °· You have a fever. °MAKE SURE YOU:  °· Understand these instructions. °· Will watch your condition. °· Will get help right away if you are not doing well or get worse. °Document Released: 03/06/2008 Document Revised: 12/11/2011 Document Reviewed: 02/17/2011 °Exit[employer]® Patient Information ©2015 [employer]. This information is not intended to replace advice given to you by your health care provider. Make sure you discuss any questions you have with your health care provider. ° °Urinary Tract Infection °A urinary tract infection (UTI) can occur any place along the urinary tract. The tract includes the kidneys, ureters, bladder, and urethra. A type of germ called bacteria often causes a UTI. UTIs are often helped with antibiotic medicine.  °HOME CARE  °· If given, take  antibiotics as told by your doctor. Finish them even if you start to feel better. °· Drink enough fluids to keep your pee (urine) clear or pale yellow. °· Avoid tea, drinks with caffeine, and bubbly (carbonated) drinks. °· Pee often. Avoid holding your pee in for a long time. °· Pee before and after having sex (intercourse). °· Wipe from front to back after you poop (bowel movement) if you are a woman. Use each tissue only once. °GET HELP RIGHT AWAY IF:  °· You have back pain. °· You have lower belly (abdominal) pain. °· You have chills. °· You feel sick to your stomach (nauseous). °· You throw up (vomit). °· Your burning or discomfort with peeing does not go away. °· You have a fever. °· Your symptoms are not better in 3 days. °MAKE SURE YOU:  °· Understand these instructions. °· Will watch your condition. °· Will get help right away if you are not doing well or get worse. °Document Released: 03/06/2008 Document Revised: 06/12/2012 Document Reviewed: 04/18/2012 °Exit[employer]® Patient Information ©2015 [employer]. This information is not intended to replace advice given to you by your health care provider. Make sure you discuss any questions you have with your health care provider. ° °

## 2015-04-23 LAB — URINE CULTURE

## 2015-04-25 ENCOUNTER — Telehealth (HOSPITAL_COMMUNITY): Payer: Self-pay

## 2015-04-25 NOTE — Telephone Encounter (Signed)
Post ED Visit - Positive Culture Follow-up  Culture report reviewed by antimicrobial stewardship pharmacist:  Wes Dulaney, Pharm.D., BCPS  Celedonio Miyamoto, [address].D., BCPS  Georgina Pillion, Pharm.D., BCPS  [place], [place].D., BCPS, AAHIVP  [name], Pharm.D., BCPS, AAHIVP  Elder Cyphers, [address].D., BCPS  Positive Urine culture, >/= 100,000 colonies -> Group B Strep Treated with Cephalexin, Laura Lopez 04/25/2015, 6:47 AM

## 2015-07-05 ENCOUNTER — Emergency Department (HOSPITAL_COMMUNITY)
Admission: EM | Admit: 2015-07-05 | Discharge: 2015-07-05 | Disposition: A | Discharge status: Home / Self Care | Payer: Self-pay | Attending: Emergency Medicine | Admitting: Emergency Medicine

## 2015-07-05 ENCOUNTER — Encounter (HOSPITAL_COMMUNITY): Payer: Self-pay | Admitting: Emergency Medicine

## 2015-07-05 DIAGNOSIS — K219 Gastro-esophageal reflux disease without esophagitis: Secondary | ICD-10-CM | POA: Insufficient documentation

## 2015-07-05 DIAGNOSIS — N390 Urinary tract infection, site not specified: Secondary | ICD-10-CM | POA: Insufficient documentation

## 2015-07-05 DIAGNOSIS — Z79899 Other long term (current) drug therapy: Secondary | ICD-10-CM | POA: Insufficient documentation

## 2015-07-05 DIAGNOSIS — G8929 Other chronic pain: Secondary | ICD-10-CM | POA: Insufficient documentation

## 2015-07-05 DIAGNOSIS — Z87891 Personal history of nicotine dependence: Secondary | ICD-10-CM | POA: Insufficient documentation

## 2015-07-05 DIAGNOSIS — Z8659 Personal history of other mental and behavioral disorders: Secondary | ICD-10-CM | POA: Insufficient documentation

## 2015-07-05 DIAGNOSIS — N760 Acute vaginitis: Secondary | ICD-10-CM | POA: Insufficient documentation

## 2015-07-05 DIAGNOSIS — R110 Nausea: Secondary | ICD-10-CM | POA: Insufficient documentation

## 2015-07-05 DIAGNOSIS — B9689 Other specified bacterial agents as the cause of diseases classified elsewhere: Secondary | ICD-10-CM

## 2015-07-05 LAB — CBC WITH DIFFERENTIAL/PLATELET
BASOS ABS: 0.1 10*3/uL (ref 0.0–0.1)
Basophils Relative: 1 %
EOS ABS: 0.2 10*3/uL (ref 0.0–0.7)
Eosinophils Relative: 2 %
HCT: 39.7 % (ref 36.0–46.0)
Hemoglobin: 13.8 g/dL (ref 12.0–15.0)
Lymphocytes Relative: 34 %
Lymphs Abs: 3.3 10*3/uL (ref 0.7–4.0)
MCH: 30.9 pg (ref 26.0–34.0)
MCHC: 34.8 g/dL (ref 30.0–36.0)
MCV: 89 fL (ref 78.0–100.0)
Monocytes Absolute: 0.9 10*3/uL (ref 0.1–1.0)
Monocytes Relative: 9 %
Neutro Abs: 5.5 10*3/uL (ref 1.7–7.7)
Neutrophils Relative %: 54 %
Platelets: 380 10*3/uL (ref 150–400)
RBC: 4.46 MIL/uL (ref 3.87–5.11)
RDW: 13.7 % (ref 11.5–15.5)
WBC: 9.9 10*3/uL (ref 4.0–10.5)

## 2015-07-05 LAB — URINE MICROSCOPIC-ADD ON

## 2015-07-05 LAB — URINALYSIS, ROUTINE W REFLEX MICROSCOPIC
BILIRUBIN URINE: NEGATIVE
Glucose, UA: NEGATIVE mg/dL
KETONES UR: NEGATIVE mg/dL
Nitrite: NEGATIVE
Protein, ur: NEGATIVE mg/dL
Urobilinogen, UA: 0.2 mg/dL (ref 0.0–1.0)
pH: 6 (ref 5.0–8.0)

## 2015-07-05 LAB — WET PREP, GENITAL
TRICH WET PREP: NONE SEEN
YEAST WET PREP: NONE SEEN

## 2015-07-05 MED ORDER — SULFAMETHOXAZOLE-TRIMETHOPRIM 800-160 MG PO TABS: 1.0000 | ORAL_TABLET | Freq: Two times a day (BID) | ORAL | Status: AC

## 2015-07-05 MED ORDER — METRONIDAZOLE 500 MG PO TABS: 500.0000 mg | ORAL_TABLET | Freq: Two times a day (BID) | ORAL | Status: DC

## 2015-07-05 NOTE — ED Provider Notes (Signed)
CSN: [id number]     Arrival date & time 07/05/15  1511 History  By signing my name below, I, Laura Lopez, attest that this documentation has been prepared under the direction and in the presence of Kerrie Buffalo, NP  Electronically Signed: Gwenyth Lopez, ED Scribe. 07/05/2015. 5:16 PM.   Chief Complaint  Patient presents with  . Vaginal Discharge  . Back Pain   Patient is a 29 y.o. female presenting with vaginal discharge and back pain. The history is provided by the patient. No language interpreter was used.  Vaginal Discharge Quality:  Manson Passey Severity:  Moderate Onset quality:  Gradual Duration:  1 week Timing:  Intermittent Progression:  Unchanged Chronicity:  New Context: spontaneously   Relieved by:  Nothing Worsened by:  Nothing tried Ineffective treatments:  None tried Associated symptoms: abdominal pain and nausea   Associated symptoms: no dysuria, no fever, no urinary frequency and no vomiting   Back Pain Associated symptoms: abdominal pain   Associated symptoms: no dysuria and no fever     HPI Comments: Laura Lopez is a 29 y.o. female G4P3, with a history of chlamydia and tubal ligation, who presents to the Emergency Department complaining of constant, moderate, malodorous brown vaginal discharge that started 1 week ago. Pt reports lower abdominal pain that started 3 days ago, lower back pain that started 2 weeks ago, vaginal itching that started 1 day ago and nausea as associated symptoms. Pt denies any recent falls or injuries. 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 Pt has 1 sexual partner who she has been with for 3 years. She denies urinary frequency, dysuria, fever, chills, ear pain, sore throat, nasal congestion, rhinorrhea, diarrhea, constipation, arthralgias and neck pain as associated symptoms.  Past Medical History  Diagnosis Date  . Depression   . GERD (gastroesophageal reflux disease)   . Chronic back pain    Past  Surgical History  Procedure Laterality Date  . Tubal ligation    . Tonsillectomy    . Tubal ligation     History reviewed. No pertinent family history. Social History  Substance Use Topics  . Smoking status: Former Games developer  . Smokeless tobacco: None  . Alcohol Use: Yes     Comment: occ   OB History    Gravida Para Term Preterm AB TAB SAB Ectopic Multiple Living   Review of Systems  Constitutional: Negative for fever.  HENT: Negative for congestion, ear pain, rhinorrhea and sore throat.   Gastrointestinal: Positive for nausea and abdominal pain. Negative for vomiting.  Genitourinary: Positive for vaginal discharge. Negative for dysuria and difficulty urinating.  Musculoskeletal: Positive for back pain. Negative for arthralgias and neck pain.  All other systems reviewed and are negative.  Allergies  Review of patient's allergies indicates no known allergies.  Home Medications   Prior to Admission medications   Medication Sig Start Date End Date Taking? Authorizing Provider  omeprazole (PRILOSEC) 20 MG capsule Take 20 mg by mouth daily.   Yes Historical Provider, MD  metroNIDAZOLE (FLAGYL) 500 MG tablet Take 1 tablet (500 mg total) by mouth 2 (two) times daily. 07/05/15   Ladelle Teodoro Orlene Och, NP  sulfamethoxazole-trimethoprim (BACTRIM DS,SEPTRA DS) 800-160 MG tablet Take 1 tablet by mouth 2 (two) times daily. 07/05/15 07/12/15  Jalaysia Lobb Orlene Och, NP   BP 139/80 mmHg  Pulse 87  Temp(Src) 98.1 F (36.7 C) (Oral)  Resp 16  Ht  (1.676 m)  Wt 217 lb (98.431 kg)  BMI 35.04 kg/m2  SpO2 100%  LMP 06/26/2015 Physical Exam  Constitutional: She appears well-developed and well-nourished. No distress.  HENT:  Head: Normocephalic and atraumatic.  Eyes: Conjunctivae and EOM are normal.  Neck: Neck supple. No tracheal deviation present.  Cardiovascular: Normal rate, regular rhythm and normal heart sounds.   Pulmonary/Chest: Effort normal and breath sounds normal. No  respiratory distress. She has no wheezes.  Abdominal: Soft. Bowel sounds are normal. There is tenderness (Lower abdomen). There is no rebound, no guarding and no CVA tenderness.  Genitourinary:  External genitalia without lesions, frothy malodorous d/c vaginal vault. Positive CMT, no adnexal tenderness, unable to palpate uterus due to patient habitus.   Skin: Skin is warm and dry.  Psychiatric: She has a normal mood and affect. Her behavior is normal.  Nursing note and vitals reviewed.   ED Course  Procedures   DIAGNOSTIC STUDIES: Oxygen Saturation is 100% on RA, normal by my interpretation.    COORDINATION OF CARE: 4:23 PM Discussed treatment plan with pt at bedside and pt agreed to plan.  Labs Review Results for orders placed or performed during the hospital encounter of 07/05/15 (from the past 24 hour(s))  Urinalysis, Routine w reflex microscopic (not at [facility])     Status: Abnormal   Collection Time: 07/05/15  3:22 PM  Result Value Ref Range   Color, Urine YELLOW YELLOW   APPearance HAZY (A) CLEAR   Specific Gravity, Urine >1.030 (H) 1.005 - 1.030   pH 6.0 5.0 - 8.0   Glucose, UA NEGATIVE NEGATIVE mg/dL   Hgb urine dipstick TRACE (A) NEGATIVE   Bilirubin Urine NEGATIVE NEGATIVE   Ketones, ur NEGATIVE NEGATIVE mg/dL   Protein, ur NEGATIVE NEGATIVE mg/dL   Urobilinogen, UA 0.2 0.0 - 1.0 mg/dL   Nitrite NEGATIVE NEGATIVE   Leukocytes, UA SMALL (A) NEGATIVE  Urine microscopic-add on     Status: Abnormal   Collection Time: 07/05/15  3:22 PM  Result Value Ref Range   Squamous Epithelial / LPF FEW (A) RARE   WBC, UA 21-50 <3 WBC/hpf   RBC / HPF 0-2 <3 RBC/hpf   Bacteria, UA FEW (A) RARE  CBC with Differential/Platelet     Status: None   Collection Time: 07/05/15  4:25 PM  Result Value Ref Range   WBC 9.9 4.0 - 10.5 K/uL   RBC 4.46 3.87 - 5.11 MIL/uL   Hemoglobin 13.8 12.0 - 15.0 g/dL   HCT 16.1 09.6 - 04.5 %   MCV 89.0 78.0 - 100.0 fL   MCH 30.9 26.0 - 34.0 pg   MCHC  34.8 30.0 - 36.0 g/dL   RDW 40.9 81.1 - 91.4 %   Platelets 380 150 - 400 K/uL   Neutrophils Relative % 54 %   Neutro Abs 5.5 1.7 - 7.7 K/uL   Lymphocytes Relative 34 %   Lymphs Abs 3.3 0.7 - 4.0 K/uL   Monocytes Relative 9 %   Monocytes Absolute 0.9 0.1 - 1.0 K/uL   Eosinophils Relative 2 %   Eosinophils Absolute 0.2 0.0 - 0.7 K/uL   Basophils Relative 1 %   Basophils Absolute 0.1 0.0 - 0.1 K/uL  Wet prep, genital     Status: Abnormal   Collection Time: 07/05/15  4:35 PM  Result Value Ref Range   Yeast Wet Prep HPF POC NONE SEEN NONE SEEN   Trich, Wet Prep NONE SEEN NONE SEEN  Clue Cells Wet Prep HPF POC FEW (A) NONE SEEN   WBC, Wet Prep HPF POC FEW (A) NONE SEEN    I have personally reviewed and evaluated these lab results as part of my medical decision-making.  MDM  29 y.o. female with vaginal d/c, low abdominal pain and low back pain. Stable for d/c without fever and does not appear toxic. Will treat for BV and UTI. Will call patient if cultures come back positive. Discussed with the patient and all questioned fully answered. She will follow up with her doctor or return here if any problems arise.  Final diagnoses:  UTI (lower urinary tract infection)  Bacterial vaginal infection   I personally performed the services described in this documentation, which was scribed in my presence. The recorded information has been reviewed and is accurate.    [place], NP 07/05/15 1807  Vanetta Mulders, MD 07/05/15 [phone]

## 2015-07-05 NOTE — ED Notes (Signed)
Patient complaining of back pain x 2 weeks and vaginal discharge "with a strong odor" since yesterday. States she was recently treated for UTI and was given antibiotics.

## 2015-07-06 LAB — RPR: RPR Ser Ql: NONREACTIVE

## 2015-07-06 LAB — HIV ANTIBODY (ROUTINE TESTING W REFLEX): HIV Screen 4th Generation wRfx: NONREACTIVE

## 2015-07-06 LAB — GC/CHLAMYDIA PROBE AMP (~~LOC~~) NOT AT ARMC
Chlamydia: POSITIVE — AB
Neisseria Gonorrhea: POSITIVE — AB

## 2015-07-07 ENCOUNTER — Telehealth (HOSPITAL_BASED_OUTPATIENT_CLINIC_OR_DEPARTMENT_OTHER): Payer: Self-pay | Admitting: Emergency Medicine

## 2015-07-07 NOTE — Telephone Encounter (Signed)
Chart handoff to EDP for treatment plan for +GC and + chlamydia 

## 2015-07-08 ENCOUNTER — Emergency Department (HOSPITAL_COMMUNITY)
Admission: EM | Admit: 2015-07-08 | Discharge: 2015-07-08 | Disposition: A | Discharge status: Home / Self Care | Payer: Self-pay | Attending: Emergency Medicine | Admitting: Emergency Medicine

## 2015-07-08 ENCOUNTER — Telehealth (HOSPITAL_BASED_OUTPATIENT_CLINIC_OR_DEPARTMENT_OTHER): Payer: Self-pay | Admitting: Emergency Medicine

## 2015-07-08 ENCOUNTER — Encounter (HOSPITAL_COMMUNITY): Payer: Self-pay | Admitting: *Deleted

## 2015-07-08 DIAGNOSIS — A64 Unspecified sexually transmitted disease: Secondary | ICD-10-CM

## 2015-07-08 DIAGNOSIS — G8929 Other chronic pain: Secondary | ICD-10-CM | POA: Insufficient documentation

## 2015-07-08 DIAGNOSIS — Z87891 Personal history of nicotine dependence: Secondary | ICD-10-CM | POA: Insufficient documentation

## 2015-07-08 DIAGNOSIS — K219 Gastro-esophageal reflux disease without esophagitis: Secondary | ICD-10-CM | POA: Insufficient documentation

## 2015-07-08 DIAGNOSIS — Z8659 Personal history of other mental and behavioral disorders: Secondary | ICD-10-CM | POA: Insufficient documentation

## 2015-07-08 DIAGNOSIS — Z79899 Other long term (current) drug therapy: Secondary | ICD-10-CM | POA: Insufficient documentation

## 2015-07-08 DIAGNOSIS — A549 Gonococcal infection, unspecified: Secondary | ICD-10-CM | POA: Insufficient documentation

## 2015-07-08 DIAGNOSIS — A749 Chlamydial infection, unspecified: Secondary | ICD-10-CM | POA: Insufficient documentation

## 2015-07-08 DIAGNOSIS — N390 Urinary tract infection, site not specified: Secondary | ICD-10-CM | POA: Insufficient documentation

## 2015-07-08 DIAGNOSIS — N760 Acute vaginitis: Secondary | ICD-10-CM | POA: Insufficient documentation

## 2015-07-08 DIAGNOSIS — Z792 Long term (current) use of antibiotics: Secondary | ICD-10-CM | POA: Insufficient documentation

## 2015-07-08 LAB — URINALYSIS, ROUTINE W REFLEX MICROSCOPIC
BILIRUBIN URINE: NEGATIVE
Glucose, UA: NEGATIVE mg/dL
Hgb urine dipstick: NEGATIVE
Ketones, ur: NEGATIVE mg/dL
Nitrite: NEGATIVE
PROTEIN: NEGATIVE mg/dL
Specific Gravity, Urine: 1.02 (ref 1.005–1.030)
UROBILINOGEN UA: 0.2 mg/dL (ref 0.0–1.0)
pH: 7 (ref 5.0–8.0)

## 2015-07-08 LAB — PREGNANCY, URINE: PREG TEST UR: NEGATIVE

## 2015-07-08 LAB — URINE MICROSCOPIC-ADD ON

## 2015-07-08 MED ORDER — LIDOCAINE HCL (PF) 1 % IJ SOLN
INTRAMUSCULAR | Status: AC
  Administered 2015-07-08: 0.9 mL
  Filled 2015-07-08: qty 5

## 2015-07-08 MED ORDER — CEFTRIAXONE SODIUM 250 MG IJ SOLR
250.0000 mg | Freq: Once | INTRAMUSCULAR | Status: AC
  Administered 2015-07-08: 250 mg via INTRAMUSCULAR
  Filled 2015-07-08: qty 250

## 2015-07-08 MED ORDER — AZITHROMYCIN 250 MG PO TABS
1000.0000 mg | ORAL_TABLET | Freq: Once | ORAL | Status: AC
  Administered 2015-07-08: 1000 mg via ORAL
  Filled 2015-07-08: qty 4

## 2015-07-08 NOTE — ED Notes (Signed)
Patient reports low back pain, seen here earlier this week and dx with UTI and bacterial infection. Also states she was called at home and told she had a STD this morning and states she can not afford antibiotics and can not get into health dept until next Thursday.

## 2015-07-08 NOTE — ED Provider Notes (Signed)
CSN: [id number]     Arrival date & time 07/08/15  1812 History   First MD Initiated Contact with Patient 07/08/15 1819     Chief Complaint  Patient presents with  . SEXUALLY TRANSMITTED DISEASE     (Consider location/radiation/quality/duration/timing/severity/associated sxs/prior Treatment) HPI Comments:  Patient requesting treatment for STD. She was seen in the ED 3 days ago with low back pain and lower abdominal pain found to have UTI, bacterial vaginosis. She was prescribed Flagyl and Bactrim which she is taking. She was called today for positive Chlamydia and gonorrhea tests. She could not get these antibiotics filled in the pharmacy. She returns to the ED for treatment. She states her back pain is the same as what it was 3 days ago reading to her lower abdomen. She has dysuria and brown vaginal discharge. She denies any fever or vomiting. No chest pain or shortness of breath. Sexual activity with one partner.  The history is provided by the patient.    Past Medical History  Diagnosis Date  . Depression   . GERD (gastroesophageal reflux disease)   . Chronic back pain    Past Surgical History  Procedure Laterality Date  . Tubal ligation    . Tonsillectomy    . Tubal ligation     History reviewed. No pertinent family history. Social History  Substance Use Topics  . Smoking status: Former Games developer  . Smokeless tobacco: None  . Alcohol Use: Yes     Comment: occ   OB History    Gravida Para Term Preterm AB TAB SAB Ectopic Multiple Living   Review of Systems  Constitutional: Negative for fever, activity change and appetite change.  HENT: Negative for congestion and rhinorrhea.   Eyes: Negative for visual disturbance.  Respiratory: Negative for cough, chest tightness and shortness of breath.   Cardiovascular: Negative for chest pain.  Gastrointestinal: Positive for abdominal pain. Negative for nausea and vomiting.  Genitourinary: Positive for dysuria,  hematuria, flank pain and vaginal discharge.  Musculoskeletal: Negative for myalgias and arthralgias.  Skin: Negative for rash.  Neurological: Negative for dizziness, weakness and headaches.  A complete 10 system review of systems was obtained and all systems are negative except as noted in the HPI and PMH.      Allergies  Review of patient's allergies indicates no known allergies.  Home Medications   Prior to Admission medications   Medication Sig Start Date End Date Taking? Authorizing Provider  omeprazole (PRILOSEC) 20 MG capsule Take 20 mg by mouth daily.   Yes Historical Provider, MD  metroNIDAZOLE (FLAGYL) 500 MG tablet Take 1 tablet (500 mg total) by mouth 2 (two) times daily. 07/05/15   Hope Orlene Och, NP  sulfamethoxazole-trimethoprim (BACTRIM DS,SEPTRA DS) 800-160 MG tablet Take 1 tablet by mouth 2 (two) times daily. 07/05/15 07/12/15  Hope Orlene Och, NP   BP 138/88 mmHg  Pulse 96  Temp(Src) 97.9 F (36.6 C) (Oral)  Resp 16  Ht  (1.676 m)  Wt 217 lb (98.431 kg)  BMI 35.04 kg/m2  SpO2 99%  LMP 06/26/2015 Physical Exam  Constitutional: She is oriented to person, place, and time. She appears well-developed and well-nourished. No distress.  HENT:  Head: Normocephalic and atraumatic.  Mouth/Throat: Oropharynx is clear and moist. No oropharyngeal exudate.  Eyes: Conjunctivae and EOM are normal. Pupils are equal, round, and reactive to light.  Neck: Normal range of motion. Neck  supple.  No meningismus.  Cardiovascular: Normal rate, regular rhythm, normal heart sounds and intact distal pulses.   No murmur heard. Pulmonary/Chest: Effort normal and breath sounds normal. No respiratory distress.  Abdominal: Soft. There is tenderness. There is no rebound and no guarding.   Minimal suprapubic tenderness, no guarding or rebound. No right lower quadrant pain.  Genitourinary:   Chaperone present, bimanual exam performed. Positive cervical motion tenderness, no lateralizing  adnexal tenderness.  Musculoskeletal: Normal range of motion. She exhibits tenderness. She exhibits no edema.  para spinal lumbar tenderness, no CVA tenderness  Neurological: She is alert and oriented to person, place, and time. No cranial nerve deficit. She exhibits normal muscle tone. Coordination normal.  No ataxia on finger to nose bilaterally. No pronator drift. 5/5 strength throughout. CN 2-12 intact. Negative Romberg. Equal grip strength. Sensation intact. Gait is normal.   Skin: Skin is warm.  Psychiatric: She has a normal mood and affect. Her behavior is normal.  Nursing note and vitals reviewed.   ED Course  Procedures (including critical care time) Labs Review Labs Reviewed  URINALYSIS, ROUTINE W REFLEX MICROSCOPIC (NOT AT [facility]) - Abnormal; Notable for the following:    Leukocytes, UA TRACE (*)    All other components within normal limits  URINE MICROSCOPIC-ADD ON - Abnormal; Notable for the following:    Squamous Epithelial / LPF MANY (*)    All other components within normal limits  PREGNANCY, URINE    Imaging Review No results found. I have personally reviewed and evaluated these images and lab results as part of my medical decision-making.   EKG Interpretation None      MDM   Final diagnoses:  STD (female)    ongoing lower abdominal pain, back pain and vaginal discharge. Treated for UTI. Here for treatment for GC and Chlamydia.   patient treated with Rocephin and azithromycin. Continue Bactrim and Flagyl for UTI and bacterial vaginosis.  Abdomen is soft without peritoneal signs. Heart rate has improved to 96. Patient well-appearing.  nontoxic appearing.  Follow-up with health department. Return precautions discussed.   Glynn Octave, MD 07/08/15 [phone]

## 2015-07-08 NOTE — ED Notes (Signed)
Pt c/o lower back pain that radiates around to bilateral groin areas. Pt also reports green vaginal discharge and some blood on tissue after urination today x 1. Denies pain with urination. Denies bowel problems. Pt tender to bilateral flank areas. Pt reports she was seen in ED 2 days ago and was told she had UTI. Pt was called today and was notified she was positive for gonorrhea and chlamydia and prescriptions were called into pharmacy. Pt reports the prescriptions sent in to outpatient pharmacy she was unable to obtain today due to medications being out of stock at the pharmacy.

## 2015-07-08 NOTE — Discharge Instructions (Signed)
Sexually Transmitted Disease °A sexually transmitted disease (STD) is a disease or infection that may be passed (transmitted) from person to person, usually during sexual activity. This may happen by way of saliva, semen, blood, vaginal mucus, or urine. Common STDs include: °· Gonorrhea. °· Chlamydia. °· Syphilis. °· HIV and AIDS. °· Genital herpes. °· Hepatitis B and C. °· Trichomonas. °· Human papillomavirus (HPV). °· Pubic lice. °· Scabies. °· Mites. °· Bacterial vaginosis. °WHAT ARE CAUSES OF STDs? °An STD may be caused by bacteria, a virus, or parasites. STDs are often transmitted during sexual activity if one person is infected. However, they may also be transmitted through nonsexual means. STDs may be transmitted after:  °· Sexual intercourse with an infected person. °· Sharing sex toys with an infected person. °· Sharing needles with an infected person or using unclean piercing or tattoo needles. °· Having intimate contact with the genitals, mouth, or rectal areas of an infected person. °· Exposure to infected fluids during birth. °WHAT ARE THE SIGNS AND SYMPTOMS OF STDs? °Different STDs have different symptoms. Some people may not have any symptoms. If symptoms are present, they may include: °· Painful or bloody urination. °· Pain in the pelvis, abdomen, vagina, anus, throat, or eyes. °· A skin rash, itching, or irritation. °· Growths, ulcerations, blisters, or sores in the genital and anal areas. °· Abnormal vaginal discharge with or without bad odor. °· Penile discharge in men. °· Fever. °· Pain or bleeding during sexual intercourse. °· Swollen glands in the groin area. °· Yellow skin and eyes (jaundice). This is seen with hepatitis. °· Swollen testicles. °· Infertility. °· Sores and blisters in the mouth. °HOW ARE STDs DIAGNOSED? °To make a diagnosis, your health care provider may: °· Take a medical history. °· Perform a physical exam. °· Take a sample of any discharge to examine. °· Swab the throat,  cervix, opening to the penis, rectum, or vagina for testing. °· Test a sample of your first morning urine. °· Perform blood tests. °· Perform a Pap test, if this applies. °· Perform a colposcopy. °· Perform a laparoscopy. °HOW ARE STDs TREATED? °Treatment depends on the STD. Some STDs may be treated but not cured. °· Chlamydia, gonorrhea, trichomonas, and syphilis can be cured with antibiotic medicine. °· Genital herpes, hepatitis, and HIV can be treated, but not cured, with prescribed medicines. The medicines lessen symptoms. °· Genital warts from HPV can be treated with medicine or by freezing, burning (electrocautery), or surgery. Warts may come back. °· HPV cannot be cured with medicine or surgery. However, abnormal areas may be removed from the cervix, vagina, or vulva. °· If your diagnosis is confirmed, your recent sexual partners need treatment. This is true even if they are symptom-free or have a negative culture or evaluation. They should not have sex until their health care providers say it is okay. °· Your health care provider may test you for infection again 3 months after treatment. °HOW CAN I REDUCE MY RISK OF GETTING AN STD? °Take these steps to reduce your risk of getting an STD: °· Use latex condoms, dental dams, and water-soluble lubricants during sexual activity. Do not use petroleum jelly or oils. °· Avoid having multiple sex partners. °· Do not have sex with someone who has other sex partners °· Do not have sex with anyone you do not know or who is at high risk for an STD. °· Avoid risky sex practices that can break your skin. °· Do not have sex   if you have open sores on your mouth or skin. °· Avoid drinking too much alcohol or taking illegal drugs. Alcohol and drugs can affect your judgment and put you in a vulnerable position. °· Avoid engaging in oral and anal sex acts. °· Get vaccinated for HPV and hepatitis. If you have not received these vaccines in the past, talk to your health care  provider about whether one or both might be right for you. °· If you are at risk of being infected with HIV, it is recommended that you take a prescription medicine daily to prevent HIV infection. This is called pre-exposure prophylaxis (PrEP). You are considered at risk if: °¨ You are a man who has sex with other men (MSM). °¨ You are a heterosexual man or woman and are sexually active with more than one partner. °¨ You take drugs by injection. °¨ You are sexually active with a partner who has HIV. °· Talk with your health care provider about whether you are at high risk of being infected with HIV. If you choose to begin PrEP, you should first be tested for HIV. You should then be tested every 3 months for as long as you are taking PrEP. °WHAT SHOULD I DO IF I THINK I HAVE AN STD? °· See your health care provider. °· Tell your sexual partner(s). They should be tested and treated for any STDs. °· Do not have sex until your health care provider says it is okay. °WHEN SHOULD I GET IMMEDIATE MEDICAL CARE? °Contact your health care provider right away if:  °· You have severe abdominal pain. °· You are a man and notice swelling or pain in your testicles. °· You are a woman and notice swelling or pain in your vagina. °  °This information is not intended to replace advice given to you by your health care provider. Make sure you discuss any questions you have with your health care provider. °  °Document Released: 12/09/2002 Document Revised: 10/09/2014 Document Reviewed: 04/08/2013 °Elsevier Interactive Patient Education ©2016 [employer] ° °

## 2015-07-08 NOTE — ED Notes (Signed)
MD at bedside. 

## 2015-07-08 NOTE — Telephone Encounter (Signed)
Post ED Visit - Positive Culture Follow-up: Successful Patient Follow-Up  Culture assessed and recommendations reviewed by:  Celedonio Miyamoto, Pharm.D., BCPS-AQ ID  Georgina Pillion, [address].D., BCPS  [place], [address].D., BCPS, AAHIVP  [name], Pharm.D., BCPS, AAHIVP  [place], [address].D.  Cassie [place], [place].D.  Positive GC and chlamydia   Patient discharged without antimicrobial prescription and treatment is now indicated  Organism is resistant to prescribed ED discharge antimicrobial  Patient with positive blood cultures  Changes discussed with ED provider:Matthew Littie Deeds MD New antibiotic prescription Cefexime  po x 1, doxycycline  po bid x 14 days, Azithromycin 1 gram po x 1 Called to [facility]  Contacted patient, 07/08/15 1207   Berle Mull 07/08/2015, 12:04 PM

## 2015-07-08 NOTE — Progress Notes (Signed)
CM called by pt requesting assistance for 3 prescriptions called to [facility]. Pt was in ED on 07/05/15 and was diagnosed with STD's and prescriptions called in today for AB for STD's. MATCH voucher faxed to [employer] for assistance. Pt was notified that voucher had been faxed. MATCH explained to pt and pt verbalized understanding of voucher.

## 2015-07-09 NOTE — Progress Notes (Signed)
CM faxed MATCH voucher faxed to [facility] where ED MD called AB to on 07/08/15. CM was notified by voicemail from [place]  that prescribers ID number not on file therefore MATCH could not be used for prescriptions. Pt had returned to ED for treatment with AB. Will follow up to see if MATCH needs to be resent or if MATCH needed at all at this time.

## 2015-12-02 ENCOUNTER — Other Ambulatory Visit: Payer: Self-pay | Admitting: Obstetrics & Gynecology

## 2015-12-13 ENCOUNTER — Other Ambulatory Visit: Payer: Self-pay | Admitting: Women's Health

## 2016-04-18 ENCOUNTER — Telehealth: Payer: Self-pay | Admitting: Obstetrics & Gynecology

## 2016-04-18 ENCOUNTER — Encounter: Payer: Self-pay | Admitting: Obstetrics & Gynecology

## 2016-04-18 ENCOUNTER — Ambulatory Visit (INDEPENDENT_AMBULATORY_CARE_PROVIDER_SITE_OTHER): Payer: BLUE CROSS/BLUE SHIELD | Admitting: Obstetrics & Gynecology

## 2016-04-18 VITALS — BP 130/88 | HR 62 | Ht 66.0 in | Wt 207.5 lb

## 2016-04-18 DIAGNOSIS — A499 Bacterial infection, unspecified: Secondary | ICD-10-CM

## 2016-04-18 DIAGNOSIS — N898 Other specified noninflammatory disorders of vagina: Secondary | ICD-10-CM | POA: Diagnosis not present

## 2016-04-18 DIAGNOSIS — Z113 Encounter for screening for infections with a predominantly sexual mode of transmission: Secondary | ICD-10-CM

## 2016-04-18 DIAGNOSIS — M545 Low back pain: Secondary | ICD-10-CM | POA: Diagnosis not present

## 2016-04-18 DIAGNOSIS — N760 Acute vaginitis: Secondary | ICD-10-CM | POA: Diagnosis not present

## 2016-04-18 DIAGNOSIS — B9689 Other specified bacterial agents as the cause of diseases classified elsewhere: Secondary | ICD-10-CM

## 2016-04-18 MED ORDER — METRONIDAZOLE 0.75 % VA GEL: VAGINAL | Status: DC

## 2016-04-18 NOTE — Progress Notes (Signed)
Patient ID: Claudia PollockSamantha F Rickenbach, female   DOB: 1986/09/10, 30 y.o.   MRN: [id number]       Chief Complaint  Patient presents with  . lower back, vaginal discharge with odor    Blood pressure 130/88, pulse 62, height 5\' 6"  (1.676 m), weight 207 lb 8 oz (94.1 kg), last menstrual period 04/05/2016.  30 y.o. G4P3010 Patient's last menstrual period was 04/05/2016. The current method of family planning is none.  Subjective Vaginal discharge for 1weeks Itching yes Irritation yes Odor yes Similar to previous yes  Previous treatment flagyl  Objective Gen WDWN female NAD Vulva:  normal appearing vulva with no masses, tenderness or lesions Vagina:  normal mucosa, thin grey discharge Cervix:  no cervical motion tenderness and no lesions Uterus:  normal size, contour, position, consistency, mobility, non-tender Adnexa: ovaries:present,  normal adnexa in size, nontender and no masses     Pertinent ROS No burning with urination, frequency or urgency No nausea, vomiting or diarrhea Nor fever chills or other constitutional symptoms   Labs or studies Wet Prep:   A sample of vaginal discharge was obtained from the posterior fornix using a cotton swab. 2 drops of saline were placed on a slide and the cotton swab was immersed in the saline. Microscopic evaluation was performed and results were as follows:  Negative  for yeast  Positive for clue cells , consistent with Bacterial vaginosis Negative for trichomonas  Normal WBC population   Whiff test: Positive     Impression Diagnoses this Encounter::   ICD-9-CM ICD-10-CM   1. BV (bacterial vaginosis) 616.10 N76.0    041.9 A49.9   2. Screening for STD (sexually transmitted disease) V74.5 Z11.3 GC/Chlamydia Probe Amp    Established relevant diagnosis(es): Also wants STD screen, per request specifically GC/chlamydia  Plan/Recommendations: Meds ordered this encounter  Medications  . DISCONTD: metroNIDAZOLE (METROGEL VAGINAL)  0.75 % vaginal gel    Sig: Nightly x 5 nights    Dispense:  70 g    Refill:  0    Labs or Scans Ordered: Orders Placed This Encounter  Procedures  . GC/Chlamydia Probe Amp    Management:: >MetroGel every other night for 10 nights  Follow up Return if symptoms worsen or fail to improve.       All questions were answered.

## 2016-04-18 NOTE — Telephone Encounter (Signed)
Pt called stating that the medication Dr. Despina HiddenEure has prescribed was to expensive and she would like to know if we could change her medication to a pill instead. Please contact pt

## 2016-04-19 ENCOUNTER — Telehealth: Payer: Self-pay | Admitting: Obstetrics & Gynecology

## 2016-04-19 MED ORDER — METRONIDAZOLE 500 MG PO TABS: 500.0000 mg | ORAL_TABLET | Freq: Two times a day (BID) | ORAL | Status: DC

## 2016-04-19 NOTE — Telephone Encounter (Signed)
Pt requesting Rx for the Metrogel in tablet form due to cost and insurance coverage.

## 2016-04-19 NOTE — Telephone Encounter (Signed)
Unable to contact pt x 3 attempts.

## 2016-04-20 LAB — GC/CHLAMYDIA PROBE AMP
Chlamydia trachomatis, NAA: NEGATIVE
NEISSERIA GONORRHOEAE BY PCR: NEGATIVE

## 2016-05-02 ENCOUNTER — Encounter: Payer: Self-pay | Admitting: Adult Health

## 2016-05-02 ENCOUNTER — Other Ambulatory Visit: Payer: BLUE CROSS/BLUE SHIELD | Admitting: Adult Health

## 2016-05-22 ENCOUNTER — Encounter (HOSPITAL_COMMUNITY): Payer: Self-pay | Admitting: Emergency Medicine

## 2016-05-22 ENCOUNTER — Emergency Department (HOSPITAL_COMMUNITY)
Admission: EM | Admit: 2016-05-22 | Discharge: 2016-05-22 | Disposition: A | Discharge status: Home / Self Care | Payer: BLUE CROSS/BLUE SHIELD | Attending: Emergency Medicine | Admitting: Emergency Medicine

## 2016-05-22 DIAGNOSIS — Y92410 Unspecified street and highway as the place of occurrence of the external cause: Secondary | ICD-10-CM | POA: Diagnosis not present

## 2016-05-22 DIAGNOSIS — Y939 Activity, unspecified: Secondary | ICD-10-CM | POA: Insufficient documentation

## 2016-05-22 DIAGNOSIS — M545 Low back pain, unspecified: Secondary | ICD-10-CM

## 2016-05-22 DIAGNOSIS — Z79899 Other long term (current) drug therapy: Secondary | ICD-10-CM | POA: Diagnosis not present

## 2016-05-22 DIAGNOSIS — Z87891 Personal history of nicotine dependence: Secondary | ICD-10-CM | POA: Insufficient documentation

## 2016-05-22 DIAGNOSIS — Y999 Unspecified external cause status: Secondary | ICD-10-CM | POA: Insufficient documentation

## 2016-05-22 MED ORDER — METHOCARBAMOL 500 MG PO TABS: 500.0000 mg | ORAL_TABLET | Freq: Two times a day (BID) | ORAL | 0 refills | Status: DC

## 2016-05-22 MED ORDER — DICLOFENAC SODIUM 50 MG PO TBEC: 50.0000 mg | DELAYED_RELEASE_TABLET | Freq: Two times a day (BID) | ORAL | 0 refills | Status: DC

## 2016-05-22 NOTE — Discharge Instructions (Signed)
Return if any problems.  See your Physician for recheck in 1 week if pain persist °
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
## 2016-06-12 ENCOUNTER — Encounter (HOSPITAL_COMMUNITY): Payer: Self-pay | Admitting: Emergency Medicine

## 2016-06-12 ENCOUNTER — Emergency Department (HOSPITAL_COMMUNITY): Payer: BLUE CROSS/BLUE SHIELD

## 2016-06-12 ENCOUNTER — Emergency Department (HOSPITAL_COMMUNITY)
Admission: EM | Admit: 2016-06-12 | Discharge: 2016-06-12 | Disposition: A | Discharge status: Home / Self Care | Payer: BLUE CROSS/BLUE SHIELD | Attending: Emergency Medicine | Admitting: Emergency Medicine

## 2016-06-12 DIAGNOSIS — N390 Urinary tract infection, site not specified: Secondary | ICD-10-CM

## 2016-06-12 DIAGNOSIS — Z79899 Other long term (current) drug therapy: Secondary | ICD-10-CM | POA: Diagnosis not present

## 2016-06-12 DIAGNOSIS — R0789 Other chest pain: Secondary | ICD-10-CM | POA: Diagnosis not present

## 2016-06-12 DIAGNOSIS — Z87891 Personal history of nicotine dependence: Secondary | ICD-10-CM | POA: Diagnosis not present

## 2016-06-12 DIAGNOSIS — M546 Pain in thoracic spine: Secondary | ICD-10-CM | POA: Diagnosis present

## 2016-06-12 HISTORY — DX: Anxiety disorder, unspecified: F41.9

## 2016-06-12 LAB — URINALYSIS, ROUTINE W REFLEX MICROSCOPIC
Bilirubin Urine: NEGATIVE
GLUCOSE, UA: NEGATIVE mg/dL
Ketones, ur: NEGATIVE mg/dL
NITRITE: POSITIVE — AB
PH: 6 (ref 5.0–8.0)
Protein, ur: 30 mg/dL — AB

## 2016-06-12 LAB — BASIC METABOLIC PANEL
ANION GAP: 2 — AB (ref 5–15)
BUN: 11 mg/dL (ref 6–20)
CALCIUM: 9.1 mg/dL (ref 8.9–10.3)
CO2: 25 mmol/L (ref 22–32)
Chloride: 106 mmol/L (ref 101–111)
Creatinine, Ser: 0.81 mg/dL (ref 0.44–1.00)
GFR calc Af Amer: >60 mL/min (ref >60)
Glucose, Bld: 93 mg/dL (ref 65–99)
POTASSIUM: 3.5 mmol/L (ref 3.5–5.1)
SODIUM: 133 mmol/L — AB (ref 135–145)

## 2016-06-12 LAB — URINE MICROSCOPIC-ADD ON

## 2016-06-12 LAB — CBC
HEMATOCRIT: 41.7 % (ref 36.0–46.0)
HEMOGLOBIN: 14.5 g/dL (ref 12.0–15.0)
MCH: 31.5 pg (ref 26.0–34.0)
MCHC: 34.8 g/dL (ref 30.0–36.0)
MCV: 90.7 fL (ref 78.0–100.0)
Platelets: 372 10*3/uL (ref 150–400)
RBC: 4.6 MIL/uL (ref 3.87–5.11)
RDW: 13.6 % (ref 11.5–15.5)
WBC: 8.8 10*3/uL (ref 4.0–10.5)

## 2016-06-12 LAB — TROPONIN I: Troponin I: <0.03 ng/mL (ref <0.03)

## 2016-06-12 LAB — PREGNANCY, URINE: Preg Test, Ur: NEGATIVE

## 2016-06-12 MED ORDER — METHOCARBAMOL 500 MG PO TABS
1000.0000 mg | ORAL_TABLET | Freq: Once | ORAL | Status: AC
  Administered 2016-06-12: 1000 mg via ORAL
  Filled 2016-06-12: qty 2

## 2016-06-12 MED ORDER — IBUPROFEN 800 MG PO TABS
800.0000 mg | ORAL_TABLET | Freq: Once | ORAL | Status: AC
  Administered 2016-06-12: 800 mg via ORAL
  Filled 2016-06-12: qty 1

## 2016-06-12 MED ORDER — NAPROXEN 500 MG PO TABS: 500.0000 mg | ORAL_TABLET | Freq: Two times a day (BID) | ORAL | 0 refills | Status: DC

## 2016-06-12 MED ORDER — CEPHALEXIN 500 MG PO CAPS: 500.0000 mg | ORAL_CAPSULE | Freq: Four times a day (QID) | ORAL | 0 refills | Status: DC

## 2016-06-12 NOTE — ED Triage Notes (Signed)
Pt reports pain in left side of upper back radiating to left chest, pt has had sob this morning with weakness. Pt has hx panic attacks but states this didn't feel like a panic attack, pt is alert and oriented, ambulatory.

## 2016-06-12 NOTE — ED Provider Notes (Signed)
[facility] Provider Note   CSN: [id number] Arrival date & time: 06/12/16  1110  By signing my name below, I, Laura Lopez, attest that this documentation has been prepared under the direction and in the presence of Glynn OctaveStephen Docia Klar, MD . Electronically Signed: Majel HomerPeyton Lopez, Scribe. 06/12/2016. 12:20 PM.  History   Chief Complaint Chief Complaint  Patient presents with  . Chest Pain   The history is provided by the patient. No language interpreter was used.   HPI Comments: Laura Lopez is a 30 y.o. female with PMHx of depression, panic attacks and chronic back pain, who presents to the Emergency Department complaining of gradually worsening, constant, back pain radiating into her chest that began this morning. Pt reports her pain begins in her upper back and radiates to her left shoulder and into her chest. She states her back pani is constant but her chest pain only returns every 2-3 minutes. 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 She reports hx of panic attacks but states this does not feel similar. She denies taking any medication for her pain. She denies recent injury or fall, lifting heavy objects and hx of MI. She notes her last normal menstrual period ended on 06/09/16.   Past Medical History:  Diagnosis Date  . Anxiety   . Chronic back pain   . Depression   . GERD (gastroesophageal reflux disease)     Patient Active Problem List   Diagnosis Date Noted  . Major depressive disorder, recurrent episode, moderate (HCC) 01/31/2013    Class: Acute    Past Surgical History:  Procedure Laterality Date  . TONSILLECTOMY    . TUBAL LIGATION    . TUBAL LIGATION      OB History    Gravida Para Term Preterm AB Living   4 3 3   1      SAB TAB Ectopic Multiple Live Births     1             Home Medications    Prior to Admission medications   Medication Sig Start Date End Date Taking?  Authorizing Provider  diclofenac (VOLTAREN) 50 MG EC tablet Take 1 tablet (50 mg total) by mouth 2 (two) times daily. 05/22/16   Elson AreasLeslie K Sofia, PA-C  methocarbamol (ROBAXIN) 500 MG tablet Take 1 tablet (500 mg total) by mouth 2 (two) times daily. 05/22/16   Elson AreasLeslie K Sofia, PA-C  metroNIDAZOLE (FLAGYL) 500 MG tablet Take 1 tablet (500 mg total) by mouth 2 (two) times daily. 04/19/16   Lazaro ArmsLuther H Eure, MD  omeprazole (PRILOSEC) 20 MG capsule Take 20 mg by mouth daily.    Historical Provider, MD    Family History History reviewed. No pertinent family history.  Social History Social History  Substance Use Topics  . Smoking status: Former Games developermoker  . Smokeless tobacco: Never Used  . Alcohol use 0.0 oz/week     Comment: occ     Allergies   Review of patient's allergies indicates no known allergies.   Review of Systems Review of Systems 10 systems reviewed and all are negative for acute change except as noted in the HPI.  Physical Exam Updated Vital Signs BP 141/82 (BP Location: Left Arm)   Pulse 87   Temp 98.9 F (37.2 C) (Oral)   Resp 18   Ht 5\' 6"  (1.676 m)   Wt 217 lb (98.4 kg)  LMP 06/09/2016 (Exact Date)   SpO2 100%   BMI 35.02 kg/m   Physical Exam  Constitutional: She is oriented to person, place, and time. She appears well-developed and well-nourished. No distress.  HENT:  Head: Normocephalic and atraumatic.  Mouth/Throat: Oropharynx is clear and moist. No oropharyngeal exudate.  Eyes: Conjunctivae and EOM are normal. Pupils are equal, round, and reactive to light.  Neck: Normal range of motion. Neck supple.  No meningismus.  Cardiovascular: Normal rate, regular rhythm, normal heart sounds and intact distal pulses.   No murmur heard. Pulmonary/Chest: Effort normal and breath sounds normal. No respiratory distress.  Abdominal: Soft. There is no tenderness. There is no rebound and no guarding.  Musculoskeletal: Normal range of motion. She exhibits tenderness. She  exhibits no edema.  Reproducible left chest,scapular and upper back pain worse with arm movement. Intact pulses and grip strength bilaterally   Neurological: She is alert and oriented to person, place, and time. No cranial nerve deficit. She exhibits normal muscle tone. Coordination normal.  No ataxia on finger to nose bilaterally. No pronator drift. 5/5 strength throughout. CN 2-12 intact.Equal grip strength. Sensation intact.   Skin: Skin is warm.  Psychiatric: She has a normal mood and affect. Her behavior is normal.  Nursing note and vitals reviewed.  ED Treatments / Results  Labs (all labs ordered are listed, but only abnormal results are displayed) Labs Reviewed  BASIC METABOLIC PANEL - Abnormal; Notable for the following:       Result Value   Sodium 133 (*)    Anion gap 2 (*)    All other components within normal limits  URINALYSIS, ROUTINE W REFLEX MICROSCOPIC (NOT AT [facility]) - Abnormal; Notable for the following:    Specific Gravity, Urine >1.030 (*)    Hgb urine dipstick TRACE (*)    Protein, ur 30 (*)    Nitrite POSITIVE (*)    Leukocytes, UA SMALL (*)    All other components within normal limits  URINE MICROSCOPIC-ADD ON - Abnormal; Notable for the following:    Squamous Epithelial / LPF 6-30 (*)    Bacteria, UA FEW (*)    All other components within normal limits  URINE CULTURE  CBC  TROPONIN I  PREGNANCY, URINE    EKG  EKG Interpretation None       Radiology Dg Chest 2 View  Result Date: 06/12/2016 CLINICAL DATA:  Left-sided back and chest pain since early this morning. EXAM: CHEST  2 VIEW COMPARISON:  10/06/2014 FINDINGS: The heart size and mediastinal contours are within normal limits. Both lungs are clear. The visualized skeletal structures are unremarkable. IMPRESSION: Normal chest x-ray. Electronically Signed   By: Rudie Meyer M.D.   On: 06/12/2016 12:00    Procedures Procedures (including critical care time)  Medications Ordered in ED Medications  - No data to display   Initial Impression / Assessment and Plan / ED Course  I have reviewed the triage vital signs and the nursing notes.  Pertinent labs & imaging results that were available during my care of the patient were reviewed by me and considered in my medical decision making (see chart for details).  Clinical Course  DIAGNOSTIC STUDIES:  Oxygen Saturation is 100% on RA, normal by my interpretation.    COORDINATION OF CARE:  12:18 PM Discussed treatment plan with pt at bedside and pt agreed to plan. Patient with left upper back pain that radiates to chest that has been constant since she was working this morning.  Pain shoots to her chest lasting just a few seconds at a time.  Chest and upper back pain is reproducible. Worse with arm movement. Low suspicion for ACS.  Troponin negative. EKG nonischemic. Chest x-ray negative. We'll treat musculoskeletal pain with anti-inflammatories.  Pain is reproducible to chest and upper back. Low suspicion for ACS, pulmonary embolism or aortic dissection. Return precautions discussed. Treat possible UTI. Urine culture sent.  ED ECG REPORT   Date: 06/12/2016  Rate: 98  Rhythm: normal sinus rhythm  QRS Axis: normal  Intervals: normal  ST/T Wave abnormalities: normal  Conduction Disutrbances:none  Narrative Interpretation:   Old EKG Reviewed: unchanged  I have personally reviewed the EKG tracing and agree with the computerized printout as noted.   I personally performed the services described in this documentation, which was scribed in my presence. The recorded information has been reviewed and is accurate.   Final Clinical Impressions(s) / ED Diagnoses   Final diagnoses:  Atypical chest pain  Urinary tract infection without hematuria, site unspecified    New Prescriptions New Prescriptions   No medications on file     Glynn Octave, MD 06/12/16 1610

## 2016-06-12 NOTE — Discharge Instructions (Signed)
There is no evidence of heart attack. Take the antiinflammatory medication as prescribed. Followup with your doctor. Return to the ED if you develop new or worsening symptoms.

## 2016-06-15 LAB — URINE CULTURE

## 2016-06-16 ENCOUNTER — Telehealth (HOSPITAL_BASED_OUTPATIENT_CLINIC_OR_DEPARTMENT_OTHER): Payer: Self-pay

## 2016-06-16 NOTE — Telephone Encounter (Signed)
Post ED Visit - Positive Culture Follow-up  Culture report reviewed by antimicrobial stewardship pharmacist:  []  Laura Lopez, Pharm.D. []  Laura Lopez, Pharm.D., BCPS []  Laura Lopez, Pharm.D. []  Laura Lopez, Pharm.D., BCPS []  Laura Lopez, [address]Pharm[address]D., BCPS, AAHIVP []  Estella HuskMichelle Turner, Pharm.D., BCPS, AAHIVP []  [name], Pharm.D. []  [name], [address]Pharm[address] [name] Pharm D Positive urine culture Treated with Cephalexin, organism sensitive to the same and no further patient follow-up is required at this time.  Jerry CarasCullom, [name] 06/16/2016, 10:01 AM

## 2016-12-05 ENCOUNTER — Ambulatory Visit: Payer: BLUE CROSS/BLUE SHIELD | Admitting: Adult Health

## 2016-12-15 ENCOUNTER — Ambulatory Visit: Payer: BLUE CROSS/BLUE SHIELD | Admitting: Adult Health

## 2016-12-26 ENCOUNTER — Ambulatory Visit: Payer: BLUE CROSS/BLUE SHIELD | Admitting: Adult Health

## 2017-05-10 ENCOUNTER — Encounter (HOSPITAL_COMMUNITY): Payer: Self-pay

## 2017-05-10 ENCOUNTER — Emergency Department (HOSPITAL_COMMUNITY)
Admission: EM | Admit: 2017-05-10 | Discharge: 2017-05-10 | Disposition: A | Discharge status: Home / Self Care | Payer: BLUE CROSS/BLUE SHIELD | Attending: Emergency Medicine | Admitting: Emergency Medicine

## 2017-05-10 DIAGNOSIS — Z87891 Personal history of nicotine dependence: Secondary | ICD-10-CM | POA: Insufficient documentation

## 2017-05-10 DIAGNOSIS — N3000 Acute cystitis without hematuria: Secondary | ICD-10-CM | POA: Insufficient documentation

## 2017-05-10 DIAGNOSIS — R21 Rash and other nonspecific skin eruption: Secondary | ICD-10-CM | POA: Insufficient documentation

## 2017-05-10 LAB — URINALYSIS, ROUTINE W REFLEX MICROSCOPIC
Bilirubin Urine: NEGATIVE
GLUCOSE, UA: NEGATIVE mg/dL
Hgb urine dipstick: NEGATIVE
Ketones, ur: NEGATIVE mg/dL
Nitrite: POSITIVE — AB
PROTEIN: NEGATIVE mg/dL
Specific Gravity, Urine: 1.026 (ref 1.005–1.030)
pH: 5 (ref 5.0–8.0)

## 2017-05-10 LAB — PREGNANCY, URINE: Preg Test, Ur: NEGATIVE

## 2017-05-10 MED ORDER — NYSTATIN-TRIAMCINOLONE 100000-0.1 UNIT/GM-% EX CREA: 1.0000 "application " | TOPICAL_CREAM | Freq: Two times a day (BID) | CUTANEOUS | 0 refills | Status: DC

## 2017-05-10 MED ORDER — CEPHALEXIN 500 MG PO CAPS: 500.0000 mg | ORAL_CAPSULE | Freq: Four times a day (QID) | ORAL | 0 refills | Status: DC

## 2017-05-10 NOTE — Discharge Instructions (Signed)
Apply the cream prescribed for up to 10 days to your area of rash.  Get rechecked if this is not responding within 10 days.

## 2017-05-10 NOTE — ED Triage Notes (Signed)
Pt reports patchy dark rash on upper back for the past week.  Also report burning with urination x 3 days.

## 2017-05-10 NOTE — ED Provider Notes (Signed)
[facility] Provider Note   CSN: [id number] Arrival date & time: 05/10/17  1530     History   Chief Complaint Chief Complaint  Patient presents with  . Rash    HPI Laura Lopez is a 31 y.o. female with 2 medical complaints, the first being a pruritic rash on her upper back present for the past week which has not responded to several days worth of hydrocortisone treatment.  She denies any recognized new exposures to soaps, lotions, detergents or chemical.  She does have a history of eczema.  Secondly, reports a one week history of urinary frequency, burning pain with urination and bilateral low back pain. She denies hematuria, flank pain, fevers, chills, n/v and vaginal discharge.  The history is provided by the patient.    Past Medical History:  Diagnosis Date  . Anxiety   . Chronic back pain   . Depression   . GERD (gastroesophageal reflux disease)     Patient Active Problem List   Diagnosis Date Noted  . Major depressive disorder, recurrent episode, moderate (HCC) 01/31/2013    Class: Acute    Past Surgical History:  Procedure Laterality Date  . TONSILLECTOMY    . TUBAL LIGATION    . TUBAL LIGATION      OB History    Gravida Para Term Preterm AB Living   4 3 3   1      SAB TAB Ectopic Multiple Live Births     1             Home Medications    Prior to Admission medications   Medication Sig Start Date End Date Taking? Authorizing Provider  cephALEXin (KEFLEX) 500 MG capsule Take 1 capsule (500 mg total) by mouth 4 (four) times daily. 05/10/17   Burgess Amor, PA-C  diclofenac (VOLTAREN) 50 MG EC tablet Take 1 tablet (50 mg total) by mouth 2 (two) times daily. Patient not taking: Reported on 06/12/2016 05/22/16   Elson Areas, PA-C  methocarbamol (ROBAXIN) 500 MG tablet Take 1 tablet (500 mg total) by mouth 2 (two) times daily. Patient not taking: Reported on 06/12/2016 05/22/16   Elson Areas, PA-C  metroNIDAZOLE (FLAGYL) 500 MG tablet Take 1  tablet (500 mg total) by mouth 2 (two) times daily. Patient not taking: Reported on 06/12/2016 04/19/16   Lazaro Arms, MD  naproxen (NAPROSYN) 500 MG tablet Take 1 tablet (500 mg total) by mouth 2 (two) times daily. 06/12/16   Rancour, Jeannett Senior, MD  nystatin-triamcinolone (MYCOLOG II) cream Apply 1 application topically 2 (two) times daily. Apply for up to 10 days. 05/10/17   Burgess Amor, PA-C  omeprazole (PRILOSEC) 20 MG capsule Take 20 mg by mouth daily.    [provider]    Family History No family history on file.  Social History Social History  Substance Use Topics  . Smoking status: Former Games developer  . Smokeless tobacco: Never Used  . Alcohol use 0.0 oz/week     Comment: occ     Allergies   Patient has no known allergies.   Review of Systems Review of Systems  Constitutional: Negative for chills and fever.  HENT: Negative for congestion and sore throat.   Eyes: Negative.   Respiratory: Negative for chest tightness, shortness of breath and wheezing.   Cardiovascular: Negative for chest pain.  Gastrointestinal: Negative for abdominal pain and nausea.  Genitourinary: Positive for dysuria and frequency. Negative for hematuria and vaginal discharge.  Musculoskeletal: Negative.  Negative  for arthralgias, joint swelling and neck pain.  Skin: Positive for rash. Negative for wound.  Neurological: Negative for dizziness, weakness, light-headedness, numbness and headaches.  Psychiatric/Behavioral: Negative.      Physical Exam Updated Vital Signs BP (!) 143/91 (BP Location: Right Arm)   Pulse 84   Temp 98.6 F (37 C) (Oral)   Resp 18   Ht 5\' 6"  (1.676 m)   Wt 93.4 kg (206 lb)   LMP 05/05/2017   SpO2 99%   BMI 33.25 kg/m   Physical Exam  Constitutional: She appears well-developed and well-nourished.  HENT:  Head: Normocephalic and atraumatic.  Eyes: Conjunctivae are normal.  Neck: Normal range of motion.  Cardiovascular: Normal rate, regular rhythm, normal  heart sounds and intact distal pulses.   Pulmonary/Chest: Effort normal and breath sounds normal. She has no wheezes.  Abdominal: Soft. Bowel sounds are normal. She exhibits no mass. There is no tenderness. There is no guarding and no CVA tenderness.  Musculoskeletal: Normal range of motion.  Neurological: She is alert.  Skin: Skin is warm and dry. Rash noted. No purpura noted. Rash is macular. Rash is not nodular, not pustular, not vesicular and not urticarial.  Macular, excoriated rash mid back.  Few satellite lesions, no central clearing.  Psychiatric: She has a normal mood and affect.  Nursing note and vitals reviewed.    ED Treatments / Results  Labs (all labs ordered are listed, but only abnormal results are displayed) Labs Reviewed  URINALYSIS, ROUTINE W REFLEX MICROSCOPIC - Abnormal; Notable for the following:       Result Value   APPearance HAZY (*)    Nitrite POSITIVE (*)    Leukocytes, UA MODERATE (*)    Bacteria, UA MANY (*)    Squamous Epithelial / LPF 0-5 (*)    All other components within normal limits  PREGNANCY, URINE    EKG  EKG Interpretation None       Radiology No results found.  Procedures Procedures (including critical care time)  Medications Ordered in ED Medications - No data to display   Initial Impression / Assessment and Plan / ED Course  I have reviewed the triage vital signs and the nursing notes.  Pertinent labs & imaging results that were available during my care of the patient were reviewed by me and considered in my medical decision making (see chart for details).     Simple uti tx with keflex.  Return precautions discussed.  Nonspecific rash, appears atopic, will also cover for fungal given a few satellite lesions.  Prn f/u for any new or worsened sx.   Final Clinical Impressions(s) / ED Diagnoses   Final diagnoses:  Acute cystitis without hematuria  Rash    New Prescriptions Discharge Medication List as of 05/10/2017  4:30  PM    START taking these medications   Details  nystatin-triamcinolone (MYCOLOG II) cream Apply 1 application topically 2 (two) times daily. Apply for up to 10 days., Starting Thu 05/10/2017, Print         Burgess Amordol, Marybelle Giraldo, PA-C 05/10/17 1849    Mancel BaleWentz, Elliott, MD 05/10/17 [phone]

## 2017-06-23 ENCOUNTER — Emergency Department (HOSPITAL_COMMUNITY): Payer: Self-pay

## 2017-06-23 ENCOUNTER — Encounter (HOSPITAL_COMMUNITY): Payer: Self-pay | Admitting: Emergency Medicine

## 2017-06-23 ENCOUNTER — Emergency Department (HOSPITAL_COMMUNITY)
Admission: EM | Admit: 2017-06-23 | Discharge: 2017-06-23 | Disposition: A | Discharge status: Home / Self Care | Payer: Self-pay | Attending: Emergency Medicine | Admitting: Emergency Medicine

## 2017-06-23 DIAGNOSIS — B9789 Other viral agents as the cause of diseases classified elsewhere: Secondary | ICD-10-CM | POA: Insufficient documentation

## 2017-06-23 DIAGNOSIS — Z79899 Other long term (current) drug therapy: Secondary | ICD-10-CM | POA: Insufficient documentation

## 2017-06-23 DIAGNOSIS — J069 Acute upper respiratory infection, unspecified: Secondary | ICD-10-CM | POA: Insufficient documentation

## 2017-06-23 DIAGNOSIS — Z87891 Personal history of nicotine dependence: Secondary | ICD-10-CM | POA: Insufficient documentation

## 2017-06-23 MED ORDER — IBUPROFEN 800 MG PO TABS
800.0000 mg | ORAL_TABLET | Freq: Once | ORAL | Status: AC
  Administered 2017-06-23: 800 mg via ORAL
  Filled 2017-06-23: qty 1

## 2017-06-23 MED ORDER — LORATADINE-PSEUDOEPHEDRINE ER 5-120 MG PO TB12: 1.0000 | ORAL_TABLET | Freq: Two times a day (BID) | ORAL | 0 refills | Status: DC

## 2017-06-23 MED ORDER — ONDANSETRON HCL 4 MG PO TABS
4.0000 mg | ORAL_TABLET | Freq: Once | ORAL | Status: AC
  Administered 2017-06-23: 4 mg via ORAL
  Filled 2017-06-23: qty 1

## 2017-06-23 MED ORDER — PROMETHAZINE-DM 6.25-15 MG/5ML PO SYRP: 5.0000 mL | ORAL_SOLUTION | Freq: Four times a day (QID) | ORAL | 0 refills | Status: DC | PRN

## 2017-06-23 MED ORDER — PSEUDOEPHEDRINE HCL 60 MG PO TABS
60.0000 mg | ORAL_TABLET | Freq: Once | ORAL | Status: AC
  Administered 2017-06-23: 60 mg via ORAL
  Filled 2017-06-23: qty 1

## 2017-06-23 NOTE — ED Triage Notes (Signed)
Pt reports flu like symptoms since Wed with cough, sore throat, body aches, and ear pain.  Chills with no known fever.

## 2017-06-23 NOTE — ED Notes (Signed)
Throat swab obtained.

## 2017-06-23 NOTE — ED Notes (Signed)
Pt complains of flu like sx for the last 4 days Fever/chills which she reports has resolved D Sore throat  Body aches Sinus pressure

## 2017-06-23 NOTE — ED Notes (Signed)
HB in to assess 

## 2017-06-23 NOTE — ED Provider Notes (Signed)
[facility]EMERGENCY DEPT Provider Note   CSN: [id number] Arrival date & time: 06/23/17  1129     History   Chief Complaint Chief Complaint  Patient presents with  . Sore Throat  . Cough    HPI Laura Lopez is a 31 y.o. female.  The history is provided by the patient.  URI   This is a new problem. The current episode started more than 2 days ago. The problem has been gradually worsening. The maximum temperature recorded prior to her arrival was 101 to 101.9 F. Associated symptoms include diarrhea, congestion, headaches and cough. Pertinent negatives include no chest pain, no abdominal pain, no dysuria, no neck pain and no wheezing. She has tried NSAIDs (OTC medications) for the symptoms. The treatment provided mild relief.    Past Medical History:  Diagnosis Date  . Anxiety   . Chronic back pain   . Depression   . GERD (gastroesophageal reflux disease)     Patient Active Problem List   Diagnosis Date Noted  . Major depressive disorder, recurrent episode, moderate (HCC) 01/31/2013    Class: Acute    Past Surgical History:  Procedure Laterality Date  . TONSILLECTOMY    . TUBAL LIGATION    . TUBAL LIGATION      OB History    Gravida Para Term Preterm AB Living   SAB TAB Ectopic Multiple Live Births     1             Home Medications    Prior to Admission medications   Medication Sig Start Date End Date Taking? Authorizing Provider  cephALEXin (KEFLEX) 500 MG capsule Take 1 capsule (500 mg total) by mouth 4 (four) times daily. 05/10/17   Burgess Amor, PA-C  diclofenac (VOLTAREN) 50 MG EC tablet Take 1 tablet (50 mg total) by mouth 2 (two) times daily. Patient not taking: Reported on 06/12/2016 05/22/16   Elson Areas, PA-C  methocarbamol (ROBAXIN) 500 MG tablet Take 1 tablet (500 mg total) by mouth 2 (two) times daily. Patient not taking: Reported on 06/12/2016 05/22/16   Elson Areas, PA-C  metroNIDAZOLE (FLAGYL) 500 MG tablet Take 1 tablet  (500 mg total) by mouth 2 (two) times daily. Patient not taking: Reported on 06/12/2016 04/19/16   Lazaro Arms, MD  naproxen (NAPROSYN) 500 MG tablet Take 1 tablet (500 mg total) by mouth 2 (two) times daily. 06/12/16   Rancour, Jeannett Senior, MD  nystatin-triamcinolone (MYCOLOG II) cream Apply 1 application topically 2 (two) times daily. Apply for up to 10 days. 05/10/17   Burgess Amor, PA-C  omeprazole (PRILOSEC) 20 MG capsule Take 20 mg by mouth daily.    [provider]    Family History History reviewed. No pertinent family history.  Social History Social History  Substance Use Topics  . Smoking status: Former Games developer  . Smokeless tobacco: Never Used  . Alcohol use 0.0 oz/week     Comment: occ     Allergies   Patient has no known allergies.   Review of Systems Review of Systems  Constitutional: Negative for activity change.       All ROS Neg except as noted in HPI  HENT: Positive for congestion. Negative for nosebleeds.   Eyes: Negative for photophobia and discharge.  Respiratory: Positive for cough. Negative for shortness of breath and wheezing.   Cardiovascular: Negative for chest pain and palpitations.  Gastrointestinal: Positive for diarrhea. Negative for  abdominal pain and blood in stool.  Genitourinary: Negative for dysuria, frequency and hematuria.  Musculoskeletal: Negative for arthralgias, back pain and neck pain.  Skin: Negative.   Neurological: Positive for headaches. Negative for dizziness, seizures and speech difficulty.  Psychiatric/Behavioral: Negative for confusion and hallucinations.     Physical Exam Updated Vital Signs BP 138/90 (BP Location: Left Arm)   Pulse 90   Temp 98.3 F (36.8 C) (Oral)   Resp 12   Ht  (1.676 m)   Wt 91.6 kg (202 lb)   LMP 05/23/2017   SpO2 98%   BMI 32.60 kg/m   Physical Exam  Constitutional: She is oriented to person, place, and time. She appears well-developed and well-nourished.  Non-toxic appearance.    HENT:  Head: Normocephalic.  Right Ear: Tympanic membrane and external ear normal.  Left Ear: Tympanic membrane and external ear normal.  Nasal congestion present. No mastoid tenderness, redness or swelling. Minimal redness of the posterior pharynx.  Eyes: Pupils are equal, round, and reactive to light. EOM and lids are normal.  Neck: Normal range of motion. Neck supple. Carotid bruit is not present.  Cardiovascular: Normal rate, regular rhythm, normal heart sounds, intact distal pulses and normal pulses.   Pulmonary/Chest: Breath sounds normal. No respiratory distress.  Abdominal: Soft. Bowel sounds are normal. There is no tenderness. There is no guarding.  Musculoskeletal: Normal range of motion.  Lymphadenopathy:       Head (right side): No submandibular adenopathy present.       Head (left side): No submandibular adenopathy present.    She has no cervical adenopathy.  Neurological: She is alert and oriented to person, place, and time. She has normal strength. No cranial nerve deficit or sensory deficit. Coordination normal.  Skin: Skin is warm and dry. No rash noted.  Psychiatric: She has a normal mood and affect. Her speech is normal.  Nursing note and vitals reviewed.    ED Treatments / Results  Labs (all labs ordered are listed, but only abnormal results are displayed) Labs Reviewed - No data to display  EKG  EKG Interpretation None       Radiology No results found.  Procedures Procedures (including critical care time)  Medications Ordered in ED Medications - No data to display   Initial Impression / Assessment and Plan / ED Course  I have reviewed the triage vital signs and the nursing notes.  Pertinent labs & imaging results that were available during my care of the patient were reviewed by me and considered in my medical decision making (see chart for details).       Final Clinical Impressions(s) / ED Diagnoses MDM Vital signs within normal  limits. Chest x-ray shows no active cardiopulmonary disease. Examination suggest upper respiratory infection. Patient advised increase fluids. Wash hands frequently. She is treated with promethazine DM for cough and Claritin-D for congestion. She will use Tylenol every 4 hours, and ibuprofen every 6 hours for aching. We discussed the importance of increasing fluids and good hydration. The patient will see Dr. Felecia Shelling or return to the emergency department if any changes, problems, or concerns.   Final diagnoses:  Viral URI with cough    New Prescriptions Discharge Medication List as of 06/23/2017  1:13 PM    START taking these medications   Details  loratadine-pseudoephedrine (CLARITIN-D 12 HOUR) 5-120 MG tablet Take 1 tablet by mouth 2 (two) times daily., Starting Sat 06/23/2017, Print    promethazine-dextromethorphan (PROMETHAZINE-DM) 6.25-15 MG/5ML syrup Take  5 mLs by mouth 4 (four) times daily as needed for cough., Starting Sat 06/23/2017, Print         Ivery Quale, PA-C 06/24/17 1034    Donnetta Hutching, MD 06/26/17 [phone]

## 2017-06-23 NOTE — ED Notes (Signed)
To Radiology

## 2017-06-23 NOTE — Discharge Instructions (Signed)
Your chest x-ray is negative for acute problem. Your oxygen level is 98 out of a possible 100%. Please use salt water gargles 3 or 4 times daily. Please use Chloraseptic Spray for additional pain relief. Please use ibuprofen with breakfast, lunch, dinner, and at bedtime daily. Use Claritin-D every 12 hours and use promethazine DM for cough. Please wash hands frequently. Please increase fluids.

## 2017-08-10 ENCOUNTER — Encounter (HOSPITAL_COMMUNITY): Payer: Self-pay

## 2017-08-10 ENCOUNTER — Emergency Department (HOSPITAL_COMMUNITY)
Admission: EM | Admit: 2017-08-10 | Discharge: 2017-08-10 | Disposition: A | Discharge status: Home / Self Care | Payer: Self-pay | Attending: Emergency Medicine | Admitting: Emergency Medicine

## 2017-08-10 ENCOUNTER — Other Ambulatory Visit: Payer: Self-pay

## 2017-08-10 DIAGNOSIS — Z79899 Other long term (current) drug therapy: Secondary | ICD-10-CM | POA: Insufficient documentation

## 2017-08-10 DIAGNOSIS — B9689 Other specified bacterial agents as the cause of diseases classified elsewhere: Secondary | ICD-10-CM

## 2017-08-10 DIAGNOSIS — Z87891 Personal history of nicotine dependence: Secondary | ICD-10-CM | POA: Insufficient documentation

## 2017-08-10 DIAGNOSIS — N760 Acute vaginitis: Secondary | ICD-10-CM | POA: Insufficient documentation

## 2017-08-10 DIAGNOSIS — N390 Urinary tract infection, site not specified: Secondary | ICD-10-CM | POA: Insufficient documentation

## 2017-08-10 LAB — URINALYSIS, ROUTINE W REFLEX MICROSCOPIC
BILIRUBIN URINE: NEGATIVE
Bacteria, UA: NONE SEEN
Glucose, UA: NEGATIVE mg/dL
Hgb urine dipstick: NEGATIVE
Ketones, ur: NEGATIVE mg/dL
Nitrite: NEGATIVE
PH: 5 (ref 5.0–8.0)
Protein, ur: NEGATIVE mg/dL
SPECIFIC GRAVITY, URINE: 1.025 (ref 1.005–1.030)

## 2017-08-10 LAB — WET PREP, GENITAL
SPERM: NONE SEEN
TRICH WET PREP: NONE SEEN
Yeast Wet Prep HPF POC: NONE SEEN

## 2017-08-10 MED ORDER — CEPHALEXIN 500 MG PO CAPS: 500.0000 mg | ORAL_CAPSULE | Freq: Four times a day (QID) | ORAL | 0 refills | Status: DC

## 2017-08-10 MED ORDER — METRONIDAZOLE 500 MG PO TABS
500.0000 mg | ORAL_TABLET | Freq: Two times a day (BID) | ORAL | 0 refills | Status: DC
Start: 2017-08-10 — End: 2018-05-20

## 2017-08-10 NOTE — ED Provider Notes (Signed)
[facility] EMERGENCY DEPARTMENT Provider Note   CSN: [id number] Arrival date & time: 08/10/17  4098     History   Chief Complaint Chief Complaint  Patient presents with  . Vaginal Itching    HPI Laura Lopez is a 31 y.o. female.  Patient is a 31 year old female who presents to the emergency department with a complaint of vaginal itching.  Patient states that over the past 3-4 days she has been having vaginal itching and vaginal discharge.  Patient states she noted gradual lower back pain and she became concerned.  She has not had any high fever.  She has not been vomiting or having problems with excessive nausea.  There is been no vaginal bleeding reported.  No recent injury or procedure involving the vaginal area.  She presents now for assistance with these problems.   The history is provided by the patient.  Vaginal Itching  Pertinent negatives include no chest pain, no abdominal pain and no shortness of breath.    Past Medical History:  Diagnosis Date  . Anxiety   . Chronic back pain   . Depression   . GERD (gastroesophageal reflux disease)     Patient Active Problem List   Diagnosis Date Noted  . Major depressive disorder, recurrent episode, moderate (HCC) 01/31/2013    Class: Acute    Past Surgical History:  Procedure Laterality Date  . TONSILLECTOMY    . TUBAL LIGATION    . TUBAL LIGATION      OB History    Gravida Para Term Preterm AB Living   4 3 3   1      SAB TAB Ectopic Multiple Live Births     1             Home Medications    Prior to Admission medications   Medication Sig Start Date End Date Taking? Authorizing Provider  cephALEXin (KEFLEX) 500 MG capsule Take 1 capsule (500 mg total) by mouth 4 (four) times daily. 05/10/17   Burgess Amor, PA-C  diclofenac (VOLTAREN) 50 MG EC tablet Take 1 tablet (50 mg total) by mouth 2 (two) times daily. Patient not taking: Reported on 06/12/2016 05/22/16   Elson Areas, PA-C    loratadine-pseudoephedrine (CLARITIN-D 12 HOUR) 5-120 MG tablet Take 1 tablet by mouth 2 (two) times daily. 06/23/17   Ivery Quale, PA-C  methocarbamol (ROBAXIN) 500 MG tablet Take 1 tablet (500 mg total) by mouth 2 (two) times daily. Patient not taking: Reported on 06/12/2016 05/22/16   Elson Areas, PA-C  metroNIDAZOLE (FLAGYL) 500 MG tablet Take 1 tablet (500 mg total) by mouth 2 (two) times daily. Patient not taking: Reported on 06/12/2016 04/19/16   Lazaro Arms, MD  naproxen (NAPROSYN) 500 MG tablet Take 1 tablet (500 mg total) by mouth 2 (two) times daily. 06/12/16   Rancour, Jeannett Senior, MD  nystatin-triamcinolone (MYCOLOG II) cream Apply 1 application topically 2 (two) times daily. Apply for up to 10 days. 05/10/17   Burgess Amor, PA-C  omeprazole (PRILOSEC) 20 MG capsule Take 20 mg by mouth daily.    [provider]  promethazine-dextromethorphan (PROMETHAZINE-DM) 6.25-15 MG/5ML syrup Take 5 mLs by mouth 4 (four) times daily as needed for cough. 06/23/17   Ivery Quale, PA-C    Family History No family history on file.  Social History Social History   Tobacco Use  . Smoking status: Former Games developer  . Smokeless tobacco: Never Used  Substance Use Topics  . Alcohol use: Yes  Alcohol/week: 0.0 oz    Comment: occ  . Drug use: No     Allergies   Patient has no known allergies.   Review of Systems Review of Systems  Constitutional: Negative for activity change.       All ROS Neg except as noted in HPI  HENT: Negative for nosebleeds.   Eyes: Negative for photophobia and discharge.  Respiratory: Negative for cough, shortness of breath and wheezing.   Cardiovascular: Negative for chest pain and palpitations.  Gastrointestinal: Negative for abdominal pain and blood in stool.  Genitourinary: Positive for vaginal discharge. Negative for dysuria, frequency and hematuria.       Vaginal itching  Musculoskeletal: Positive for back pain. Negative for arthralgias and neck  pain.  Skin: Negative.   Neurological: Negative for dizziness, seizures and speech difficulty.  Psychiatric/Behavioral: Negative for confusion and hallucinations.     Physical Exam Updated Vital Signs BP (!) 164/97   Pulse 90   Temp 98.3 F (36.8 C) (Oral)   Resp 15   Ht 5\' 6"  (1.676 m)   Wt 93.4 kg (206 lb)   LMP 08/04/2017   SpO2 100%   BMI 33.25 kg/m   Physical Exam  Constitutional: She is oriented to person, place, and time. She appears well-developed and well-nourished.  Non-toxic appearance.  HENT:  Head: Normocephalic.  Right Ear: Tympanic membrane and external ear normal.  Left Ear: Tympanic membrane and external ear normal.  Eyes: EOM and lids are normal. Pupils are equal, round, and reactive to light.  Neck: Normal range of motion. Neck supple. Carotid bruit is not present.  Cardiovascular: Normal rate, regular rhythm, normal heart sounds, intact distal pulses and normal pulses.  Pulmonary/Chest: Breath sounds normal. No respiratory distress.  Abdominal: Soft. Bowel sounds are normal. There is no tenderness. There is no guarding.  Genitourinary:  Genitourinary Comments: Chaperone present during the examination.  No external rash appreciated.  No foreign body noted in the vaginal vault.  Mild vaginal discharge present.  There is some increased redness of the cervical office.  There is a white to gray discharge in the vaginal vault.  There is no adnexal tenderness.  There is no tenderness with cervical wall motion.  There is no mass appreciated.  Musculoskeletal: Normal range of motion.  Soreness of the paraspinal area with change of position.  Lymphadenopathy:       Head (right side): No submandibular adenopathy present.       Head (left side): No submandibular adenopathy present.    She has no cervical adenopathy.  Neurological: She is alert and oriented to person, place, and time. She has normal strength. No cranial nerve deficit or sensory deficit.  Skin: Skin is  warm and dry.  Psychiatric: She has a normal mood and affect. Her speech is normal.  Nursing note and vitals reviewed.    ED Treatments / Results  Labs (all labs ordered are listed, but only abnormal results are displayed) Labs Reviewed  URINALYSIS, ROUTINE W REFLEX MICROSCOPIC - Abnormal; Notable for the following components:      Result Value   APPearance HAZY (*)    Leukocytes, UA LARGE (*)    Squamous Epithelial / LPF 0-5 (*)    All other components within normal limits  WET PREP, GENITAL  GC/CHLAMYDIA PROBE AMP ([place]    EKG  EKG Interpretation None       Radiology No results found.  Procedures Procedures (including critical care time)  Medications  Ordered in ED Medications - No data to display   Initial Impression / Assessment and Plan / ED Course  I have reviewed the triage vital signs and the nursing notes.  Pertinent labs & imaging results that were available during my care of the patient were reviewed by me and considered in my medical decision making (see chart for details).       Final Clinical Impressions(s) / ED Diagnoses MDM Blood pressure is elevated at 164/97, otherwise vital signs within normal limits.  Pulse oximetry is 100% on room air.  Within normal limits by my interpretation.  The patient presents to the emergency department with a complaint of vaginal itching, vaginal discharge, and most recently back pain.  The urine analysis suggest a urinary tract infection. Wet prep suggest bacterial vaginosis.  Patient will be treated with Flagyl.  Culture the urine has been sent to the lab, the patient will be treated with Keflex for this.  I have asked the patient to see her primary physician in 7-10 days for recheck of the urinary tract infection for complete resolution.  Patient is in agreement with this plan.   Final diagnoses:  Urinary tract infection without hematuria, site unspecified  Bacterial vaginosis    ED Discharge  Orders    None       Ivery QualeBryant, Haleemah Buckalew, PA-C 08/10/17 1359    Eber HongMiller, Brian, MD 08/11/17 1459

## 2017-08-10 NOTE — Discharge Instructions (Signed)
Your vital signs within normal limits.  Your wet prep exam suggest bacterial vaginosis.  Please use Flagyl 2 times daily with a meal.  Please refrain from all alcohol while taking Flagyl.  Do not take this medication on an empty stomach.  Your urine analysis shows a urinary tract infection.  Please use Keflex with breakfast, lunch, dinner, and at bedtime.  Please take a full 5-day course.  Please increase fluids.  Have your urine rechecked in 7-10 days for complete resolution of this infection.  Your additional tests will be ready in the next 3-4 days.  If there are any abnormalities, someone from the flow managers office will call you with results.

## 2017-08-10 NOTE — ED Triage Notes (Signed)
Patient reports of vaginal discharge, dysuria and lower back pain x3 days ago. Recent possible exposure to STD.

## 2017-08-12 LAB — URINE CULTURE

## 2017-08-13 LAB — GC/CHLAMYDIA PROBE AMP (~~LOC~~) NOT AT ARMC
Chlamydia: NEGATIVE
NEISSERIA GONORRHEA: NEGATIVE

## 2017-11-30 ENCOUNTER — Encounter (HOSPITAL_COMMUNITY): Payer: Self-pay | Admitting: Emergency Medicine

## 2017-11-30 ENCOUNTER — Emergency Department (HOSPITAL_COMMUNITY)
Admission: EM | Admit: 2017-11-30 | Discharge: 2017-11-30 | Disposition: A | Discharge status: Home / Self Care | Payer: Self-pay | Attending: Emergency Medicine | Admitting: Emergency Medicine

## 2017-11-30 DIAGNOSIS — J111 Influenza due to unidentified influenza virus with other respiratory manifestations: Secondary | ICD-10-CM | POA: Insufficient documentation

## 2017-11-30 DIAGNOSIS — R0981 Nasal congestion: Secondary | ICD-10-CM | POA: Insufficient documentation

## 2017-11-30 DIAGNOSIS — R0789 Other chest pain: Secondary | ICD-10-CM | POA: Insufficient documentation

## 2017-11-30 DIAGNOSIS — Z87891 Personal history of nicotine dependence: Secondary | ICD-10-CM | POA: Insufficient documentation

## 2017-11-30 DIAGNOSIS — M791 Myalgia, unspecified site: Secondary | ICD-10-CM | POA: Insufficient documentation

## 2017-11-30 DIAGNOSIS — R69 Illness, unspecified: Secondary | ICD-10-CM

## 2017-11-30 DIAGNOSIS — R6883 Chills (without fever): Secondary | ICD-10-CM | POA: Insufficient documentation

## 2017-11-30 MED ORDER — PROMETHAZINE-DM 6.25-15 MG/5ML PO SYRP: 5.0000 mL | ORAL_SOLUTION | Freq: Four times a day (QID) | ORAL | 0 refills | Status: DC | PRN

## 2017-11-30 MED ORDER — LORATADINE-PSEUDOEPHEDRINE ER 5-120 MG PO TB12: 1.0000 | ORAL_TABLET | Freq: Two times a day (BID) | ORAL | 0 refills | Status: DC

## 2017-11-30 NOTE — Discharge Instructions (Signed)
Your examination suggest an influenza-like illness.  Please use your mask until symptoms have resolved.  Please wash hands frequently.  Please increase fluids.  Use Claritin-D every 12 hours for congestion.  Use promethazine DM for cough.  This medication may cause drowsiness.  Use Tylenol every 4 hours, or ibuprofen every 6 hours for fever, aching and chills.  Please see Dr. Felecia ShellingFanta if your condition continues.  Please return to the emergency department if any changes, problems, or concerns.

## 2017-11-30 NOTE — ED Provider Notes (Signed)
[facility] EMERGENCY DEPARTMENT Provider Note   CSN: [id number] Arrival date & time: 11/30/17  1606     History   Chief Complaint Chief Complaint  Patient presents with  . Cough    HPI Laura Lopez is a 32 y.o. female.  Patient is a 32 year old female who presents to the emergency department with complaint of cough and congestion.  The patient states that her problem has been going on for the last 2-3 days.  She has been having generalized body aches.  She has chills mostly at night.  She has a lot of nasal congestion.  She has a cough that is sometimes productive.  She states that she has been coughing so much that now her chest is quite sore even with taking a deep breath.  She denies coughing up any blood.  She is not sure of temperature elevation at home only chills.  She is not sure she has been around anyone is been sick recently.  She denies any immune compromising conditions.  She states she is not diabetic.  She has tried Alka-Seltzer, but states this is been minimally helpful.      Past Medical History:  Diagnosis Date  . Anxiety   . Chronic back pain   . Depression   . GERD (gastroesophageal reflux disease)     Patient Active Problem List   Diagnosis Date Noted  . Major depressive disorder, recurrent episode, moderate (HCC) 01/31/2013    Class: Acute    Past Surgical History:  Procedure Laterality Date  . TONSILLECTOMY    . TUBAL LIGATION    . TUBAL LIGATION      OB History    Gravida Para Term Preterm AB Living   4 3 3   1      SAB TAB Ectopic Multiple Live Births     1             Home Medications    Prior to Admission medications   Medication Sig Start Date End Date Taking? Authorizing Provider  cephALEXin (KEFLEX) 500 MG capsule Take 1 capsule (500 mg total) 4 (four) times daily by mouth. 08/10/17   Ivery Quale, PA-C  diclofenac (VOLTAREN) 50 MG EC tablet Take 1 tablet (50 mg total) by mouth 2 (two) times daily. Patient not taking:  Reported on 06/12/2016 05/22/16   Elson Areas, PA-C  loratadine-pseudoephedrine (CLARITIN-D 12 HOUR) 5-120 MG tablet Take 1 tablet by mouth 2 (two) times daily. 06/23/17   Ivery Quale, PA-C  methocarbamol (ROBAXIN) 500 MG tablet Take 1 tablet (500 mg total) by mouth 2 (two) times daily. Patient not taking: Reported on 06/12/2016 05/22/16   Elson Areas, PA-C  metroNIDAZOLE (FLAGYL) 500 MG tablet Take 1 tablet (500 mg total) 2 (two) times daily by mouth. 08/10/17   Ivery Quale, PA-C  naproxen (NAPROSYN) 500 MG tablet Take 1 tablet (500 mg total) by mouth 2 (two) times daily. 06/12/16   Rancour, Jeannett Senior, MD  nystatin-triamcinolone (MYCOLOG II) cream Apply 1 application topically 2 (two) times daily. Apply for up to 10 days. 05/10/17   Burgess Amor, PA-C  omeprazole (PRILOSEC) 20 MG capsule Take 20 mg by mouth daily.    [provider]  promethazine-dextromethorphan (PROMETHAZINE-DM) 6.25-15 MG/5ML syrup Take 5 mLs by mouth 4 (four) times daily as needed for cough. 06/23/17   Ivery Quale, PA-C    Family History History reviewed. No pertinent family history.  Social History Social History   Tobacco Use  . Smoking  status: Former Games developer  . Smokeless tobacco: Never Used  Substance Use Topics  . Alcohol use: Yes    Alcohol/week: 0.0 oz    Comment: occ  . Drug use: No     Allergies   Patient has no known allergies.   Review of Systems Review of Systems  Constitutional: Positive for appetite change and chills. Negative for activity change.       All ROS Neg except as noted in HPI  HENT: Positive for congestion, postnasal drip and sinus pressure. Negative for nosebleeds.   Eyes: Negative for photophobia and discharge.  Respiratory: Positive for cough and chest tightness. Negative for shortness of breath and wheezing.   Cardiovascular: Negative for chest pain and palpitations.  Gastrointestinal: Negative for abdominal pain, blood in stool and diarrhea.  Genitourinary:  Negative for dysuria, frequency and hematuria.  Musculoskeletal: Positive for myalgias. Negative for arthralgias, back pain and neck pain.  Skin: Negative.   Neurological: Negative for dizziness, seizures and speech difficulty.  Psychiatric/Behavioral: Negative for confusion and hallucinations.     Physical Exam Updated Vital Signs BP (!) 155/94 (BP Location: Right Arm)   Pulse 91   Temp 97.8 F (36.6 C) (Oral)   Resp 18   Ht 5\' 6"  (1.676 m)   Wt 95.7 kg (211 lb)   LMP 10/29/2017   SpO2 100%   BMI 34.06 kg/m   Physical Exam  Constitutional: She is oriented to person, place, and time. She appears well-developed and well-nourished.  Non-toxic appearance.  HENT:  Head: Normocephalic.  Right Ear: Tympanic membrane and external ear normal.  Left Ear: Tympanic membrane and external ear normal.  Airway is patent.  Uvula is midline.  There is mild increased redness of the uvula.  There is nasal congestion present.  There is mild discomfort to percussion over the sinuses.  No mastoid involvement.  Eyes: EOM and lids are normal. Pupils are equal, round, and reactive to light.  Neck: Normal range of motion. Neck supple. Carotid bruit is not present.  Cardiovascular: Normal rate, regular rhythm, normal heart sounds, intact distal pulses and normal pulses. Exam reveals no gallop and no friction rub.  No murmur heard. Pulmonary/Chest: Breath sounds normal. No stridor. No respiratory distress. She has no wheezes. She exhibits tenderness.  Abdominal: Soft. Bowel sounds are normal. There is no tenderness. There is no guarding.  Musculoskeletal: Normal range of motion.  Lymphadenopathy:       Head (right side): No submandibular adenopathy present.       Head (left side): No submandibular adenopathy present.    She has no cervical adenopathy.  Neurological: She is alert and oriented to person, place, and time. She has normal strength. No cranial nerve deficit or sensory deficit.  Skin: Skin is  warm and dry.  Psychiatric: She has a normal mood and affect. Her speech is normal.  Nursing note and vitals reviewed.    ED Treatments / Results  Labs (all labs ordered are listed, but only abnormal results are displayed) Labs Reviewed - No data to display  EKG  EKG Interpretation None       Radiology No results found.  Procedures Procedures (including critical care time)  Medications Ordered in ED Medications - No data to display   Initial Impression / Assessment and Plan / ED Course  I have reviewed the triage vital signs and the nursing notes.  Pertinent labs & imaging results that were available during my care of the patient were reviewed by me  and considered in my medical decision making (see chart for details).       Final Clinical Impressions(s) / ED Diagnoses  MDM Blood pressure slightly elevated at 155/94, otherwise vital signs within normal limits.  Examination suggest influenza-like illness.  Patient will be treated with Tylenol every 4 hours, or ibuprofen every 6 hours for body aches and temperature elevation.  Patient will be treated with Claritin-D for congestion, and promethazine DM for cough.  We discussed the importance of good handwashing.  We discussed the importance of good hydration.  The patient is to see Dr. Felecia ShellingFanta for additional evaluation, or return to the emergency department if any changes in condition, problems, or concerns.   Final diagnoses:  Influenza-like illness    ED Discharge Orders        Ordered    loratadine-pseudoephedrine (CLARITIN-D 12 HOUR) 5-120 MG tablet  2 times daily     11/30/17 1651    promethazine-dextromethorphan (PROMETHAZINE-DM) 6.25-15 MG/5ML syrup  4 times daily PRN     11/30/17 1651       Ivery QualeBryant, Deatra Mcmahen, PA-C 11/30/17 1651    Margarita Grizzleay, Danielle, MD 12/02/17 [phone]

## 2017-11-30 NOTE — ED Triage Notes (Signed)
Pt reports generalized aches and cough x 2 days with congestion.

## 2018-05-20 ENCOUNTER — Encounter (HOSPITAL_COMMUNITY): Payer: Self-pay | Admitting: Emergency Medicine

## 2018-05-20 ENCOUNTER — Other Ambulatory Visit: Payer: Self-pay

## 2018-05-20 ENCOUNTER — Emergency Department (HOSPITAL_COMMUNITY)
Admission: EM | Admit: 2018-05-20 | Discharge: 2018-05-20 | Disposition: A | Discharge status: Home / Self Care | Payer: Self-pay | Attending: Emergency Medicine | Admitting: Emergency Medicine

## 2018-05-20 DIAGNOSIS — N390 Urinary tract infection, site not specified: Secondary | ICD-10-CM | POA: Insufficient documentation

## 2018-05-20 DIAGNOSIS — Z87891 Personal history of nicotine dependence: Secondary | ICD-10-CM | POA: Insufficient documentation

## 2018-05-20 DIAGNOSIS — N760 Acute vaginitis: Secondary | ICD-10-CM | POA: Insufficient documentation

## 2018-05-20 DIAGNOSIS — Z79899 Other long term (current) drug therapy: Secondary | ICD-10-CM | POA: Insufficient documentation

## 2018-05-20 LAB — URINALYSIS, ROUTINE W REFLEX MICROSCOPIC
Bacteria, UA: NONE SEEN
Bilirubin Urine: NEGATIVE
GLUCOSE, UA: NEGATIVE mg/dL
HGB URINE DIPSTICK: NEGATIVE
KETONES UR: NEGATIVE mg/dL
Nitrite: NEGATIVE
PH: 6 (ref 5.0–8.0)
Protein, ur: NEGATIVE mg/dL
Specific Gravity, Urine: 1.021 (ref 1.005–1.030)

## 2018-05-20 LAB — WET PREP, GENITAL
Clue Cells Wet Prep HPF POC: NONE SEEN
SPERM: NONE SEEN
Trich, Wet Prep: NONE SEEN
WBC, Wet Prep HPF POC: NONE SEEN
Yeast Wet Prep HPF POC: NONE SEEN

## 2018-05-20 LAB — PREGNANCY, URINE: Preg Test, Ur: NEGATIVE

## 2018-05-20 MED ORDER — CEPHALEXIN 500 MG PO CAPS: 500.0000 mg | ORAL_CAPSULE | Freq: Four times a day (QID) | ORAL | 0 refills | Status: DC

## 2018-05-20 MED ORDER — METRONIDAZOLE 500 MG PO TABS: 500.0000 mg | ORAL_TABLET | Freq: Two times a day (BID) | ORAL | 0 refills | Status: DC

## 2018-05-20 NOTE — ED Triage Notes (Signed)
Patient complaining of back pain, burning with urination, and clear vaginal discharge x 1 week.

## 2018-05-20 NOTE — ED Provider Notes (Signed)
[facility] EMERGENCY DEPARTMENT Provider Note   CSN: [id number] Arrival date & time: 05/20/18  1750     History   Chief Complaint Chief Complaint  Patient presents with  . Dysuria    HPI Laura Lopez is a 33 y.o. female.  She is complaining of 1 week of low back pain and dysuria.  She is also noticed some clear vaginal discharge.  She says that has a slight odor to it.  She denies being sexually active and she had her tubes tied.  There is been no fever no chills no nausea no vomiting.  She is tried some cranberry pills without relief.  She tried to see if she could see her doctor but cannot get an appointment until September.  The history is provided by the patient.  Dysuria   This is a new problem. The current episode started more than 1 week ago. The problem occurs every urination. The problem has not changed since onset.The quality of the pain is described as burning. The pain is moderate (low back pain). There has been no fever. She is not sexually active. There is no history of pyelonephritis. Pertinent negatives include no chills, no sweats, no nausea, no vomiting, no discharge, no frequency, no hematuria, no possible pregnancy and no flank pain. She has tried home medications for the symptoms.    Past Medical History:  Diagnosis Date  . Anxiety   . Chronic back pain   . Depression   . GERD (gastroesophageal reflux disease)     Patient Active Problem List   Diagnosis Date Noted  . Major depressive disorder, recurrent episode, moderate (HCC) 01/31/2013    Class: Acute    Past Surgical History:  Procedure Laterality Date  . TONSILLECTOMY    . TUBAL LIGATION    . TUBAL LIGATION       OB History    Gravida  4   Para  3   Term  3   Preterm      AB  1   Living        SAB      TAB  1   Ectopic      Multiple      Live Births               Home Medications    Prior to Admission medications   Medication Sig Start Date End Date Taking?  Authorizing Provider  cephALEXin (KEFLEX) 500 MG capsule Take 1 capsule (500 mg total) 4 (four) times daily by mouth. 08/10/17   Ivery Quale, PA-C  diclofenac (VOLTAREN) 50 MG EC tablet Take 1 tablet (50 mg total) by mouth 2 (two) times daily. Patient not taking: Reported on 06/12/2016 05/22/16   Elson Areas, PA-C  loratadine-pseudoephedrine (CLARITIN-D 12 HOUR) 5-120 MG tablet Take 1 tablet by mouth 2 (two) times daily. 11/30/17   Ivery Quale, PA-C  methocarbamol (ROBAXIN) 500 MG tablet Take 1 tablet (500 mg total) by mouth 2 (two) times daily. Patient not taking: Reported on 06/12/2016 05/22/16   Elson Areas, PA-C  metroNIDAZOLE (FLAGYL) 500 MG tablet Take 1 tablet (500 mg total) 2 (two) times daily by mouth. 08/10/17   Ivery Quale, PA-C  naproxen (NAPROSYN) 500 MG tablet Take 1 tablet (500 mg total) by mouth 2 (two) times daily. 06/12/16   Rancour, Jeannett Senior, MD  nystatin-triamcinolone (MYCOLOG II) cream Apply 1 application topically 2 (two) times daily. Apply for up to 10 days. 05/10/17   Burgess Amor,  PA-C  omeprazole (PRILOSEC) 20 MG capsule Take 20 mg by mouth daily.    [provider]  promethazine-dextromethorphan (PROMETHAZINE-DM) 6.25-15 MG/5ML syrup Take 5 mLs by mouth 4 (four) times daily as needed for cough. 11/30/17   Ivery QualeBryant, Hobson, PA-C    Family History History reviewed. No pertinent family history.  Social History Social History   Tobacco Use  . Smoking status: Former Games developermoker  . Smokeless tobacco: Never Used  Substance Use Topics  . Alcohol use: Yes    Alcohol/week: 0.0 standard drinks    Comment: occ  . Drug use: No     Allergies   Patient has no known allergies.   Review of Systems Review of Systems  Constitutional: Negative for chills and fever.  HENT: Negative for sore throat.   Eyes: Negative for visual disturbance.  Respiratory: Negative for cough and shortness of breath.   Cardiovascular: Negative for chest pain and palpitations.    Gastrointestinal: Negative for abdominal pain, nausea and vomiting.  Genitourinary: Positive for dysuria. Negative for flank pain, frequency and hematuria.  Musculoskeletal: Positive for back pain. Negative for arthralgias.  Skin: Negative for rash.  Neurological: Negative for syncope.  All other systems reviewed and are negative.    Physical Exam Updated Vital Signs BP (!) 156/105 (BP Location: Right Arm)   Pulse 89   Temp 98.4 F (36.9 C) (Oral)   Resp 16   Wt 98 kg   LMP 04/27/2018   SpO2 100%   BMI 34.86 kg/m   Physical Exam  Constitutional: She appears well-developed and well-nourished.  HENT:  Head: Normocephalic and atraumatic.  Eyes: Conjunctivae are normal.  Neck: Neck supple.  Cardiovascular: Normal rate, regular rhythm and normal heart sounds.  Pulmonary/Chest: Effort normal. She has no wheezes. She has no rales.  Abdominal: Soft. She exhibits no mass. There is no tenderness. There is no guarding.  Genitourinary: Uterus normal. Pelvic exam was performed with patient supine. There is no rash on the right labia. There is no rash on the left labia. Cervix exhibits no motion tenderness. Right adnexum displays no mass. Left adnexum displays no mass. No erythema in the vagina. No foreign body in the vagina. No signs of injury around the vagina. Vaginal discharge (thin white) found.  Genitourinary Comments: Chaperone was present during exam.   Musculoskeletal: She exhibits no tenderness or deformity.  Neurological: She is alert. GCS eye subscore is 4. GCS verbal subscore is 5. GCS motor subscore is 6.  Skin: Skin is warm and dry. Capillary refill takes less than 2 seconds.  Psychiatric: She has a normal mood and affect.     ED Treatments / Results  Labs (all labs ordered are listed, but only abnormal results are displayed) Labs Reviewed  URINALYSIS, ROUTINE W REFLEX MICROSCOPIC - Abnormal; Notable for the following components:      Result Value   Leukocytes, UA  SMALL (*)    All other components within normal limits  WET PREP, GENITAL  PREGNANCY, URINE  GC[facility]CHLAM[facility]DIA PROBE AMP () [facility]    EKG None  Radiology No results found.  Procedures Procedures (including critical care time)  Medications Ordered in ED Medications - No data to display   Initial Impression / Assessment and Plan / ED Course  I have reviewed the triage vital signs and the nursing notes.  Pertinent labs & imaging results that were available during my care of the patient were reviewed by me and considered in my medical decision making (  see chart for details).      Final Clinical Impressions(s) / ED Diagnoses   Final diagnoses:  Lower urinary tract infectious disease  Acute vaginitis    ED Discharge Orders         Ordered    metroNIDAZOLE (FLAGYL) 500 MG tablet  2 times daily     05/20/18 1949    cephALEXin (KEFLEX) 500 MG capsule  4 times daily     05/20/18 1949           Terrilee FilesButler, Michael C, MD 05/20/18 2131

## 2018-05-20 NOTE — Discharge Instructions (Addendum)
You were evaluated in the emergency department for urinary symptoms back pain and vaginal discharge.  We did a cervical swab and will call you back if you need any change in your treatment.  We are prescribing you some metronidazole to take and Keflex.  Please drink plenty of fluids.  Tylenol and ibuprofen for pain.  Follow-up with your doctor.

## 2018-05-22 LAB — GC/CHLAMYDIA PROBE AMP (~~LOC~~) NOT AT ARMC
CHLAMYDIA, DNA PROBE: NEGATIVE
Neisseria Gonorrhea: NEGATIVE

## 2018-09-28 ENCOUNTER — Encounter (HOSPITAL_COMMUNITY): Payer: Self-pay | Admitting: Emergency Medicine

## 2018-09-28 ENCOUNTER — Other Ambulatory Visit: Payer: Self-pay

## 2018-09-28 ENCOUNTER — Emergency Department (HOSPITAL_COMMUNITY)
Admission: EM | Admit: 2018-09-28 | Discharge: 2018-09-28 | Disposition: A | Discharge status: Home / Self Care | Payer: Self-pay | Attending: Emergency Medicine | Admitting: Emergency Medicine

## 2018-09-28 DIAGNOSIS — Z87891 Personal history of nicotine dependence: Secondary | ICD-10-CM | POA: Insufficient documentation

## 2018-09-28 DIAGNOSIS — K0889 Other specified disorders of teeth and supporting structures: Secondary | ICD-10-CM | POA: Insufficient documentation

## 2018-09-28 MED ORDER — CLINDAMYCIN HCL 150 MG PO CAPS: 300.0000 mg | ORAL_CAPSULE | Freq: Four times a day (QID) | ORAL | 0 refills | Status: DC

## 2018-09-28 MED ORDER — HYDROCODONE-ACETAMINOPHEN 5-325 MG PO TABS: ORAL_TABLET | ORAL | 0 refills | Status: DC

## 2018-09-28 NOTE — ED Provider Notes (Signed)
[facility] EMERGENCY DEPARTMENT Provider Note   CSN: [id number] Arrival date & time: 09/28/18  1109     History   Chief Complaint Chief Complaint  Patient presents with  . Dental Pain    HPI Laura Lopez is a 32 y.o. female.  HPI  Laura Lopez is a 32 y.o. female who presents to the Emergency Department complaining of left lower dental pain and swelling of her gums.  Symptoms have been present for 2 days.  She describes increasing pain since yesterday.  Pain is worse with exposure to cold air and chewing.  She has tried over-the-counter analgesics without relief.  She describes a throbbing sensation to her back teeth on the left lower side.  She currently does not have a dentist.  She denies difficulty swallowing, fever, chills, facial swelling, and  neck pain.    Past Medical History:  Diagnosis Date  . Anxiety   . Chronic back pain   . Depression   . GERD (gastroesophageal reflux disease)     Patient Active Problem List   Diagnosis Date Noted  . Major depressive disorder, recurrent episode, moderate (HCC) 01/31/2013    Class: Acute    Past Surgical History:  Procedure Laterality Date  . TONSILLECTOMY    . TUBAL LIGATION    . TUBAL LIGATION       OB History    Gravida  4   Para  3   Term  3   Preterm      AB  1   Living        SAB      TAB  1   Ectopic      Multiple      Live Births               Home Medications    Prior to Admission medications   Medication Sig Start Date End Date Taking? Authorizing Provider  cephALEXin (KEFLEX) 500 MG capsule Take 1 capsule (500 mg total) by mouth 4 (four) times daily. 05/20/18   Terrilee FilesButler, Michael C, MD  metroNIDAZOLE (FLAGYL) 500 MG tablet Take 1 tablet (500 mg total) by mouth 2 (two) times daily. 05/20/18   Terrilee FilesButler, Michael C, MD    Family History No family history on file.  Social History Social History   Tobacco Use  . Smoking status: Former Games developermoker  . Smokeless tobacco:  Never Used  Substance Use Topics  . Alcohol use: Yes    Alcohol/week: 0.0 standard drinks    Comment: occ  . Drug use: No     Allergies   Patient has no known allergies.   Review of Systems Review of Systems  Constitutional: Negative for appetite change and fever.  HENT: Positive for dental problem. Negative for congestion, facial swelling, sore throat and trouble swallowing.   Eyes: Negative for pain and visual disturbance.  Musculoskeletal: Negative for neck pain and neck stiffness.  Neurological: Negative for dizziness, facial asymmetry and headaches.  Hematological: Negative for adenopathy.     Physical Exam Updated Vital Signs BP (!) 148/90 (BP Location: Right Arm)   Pulse 85   Temp 98.2 F (36.8 C) (Oral)   Resp 18   Ht 5\' 4"  (1.626 m)   Wt 113.4 kg   LMP 09/19/2018   SpO2 99%   BMI 42.91 kg/m   Physical Exam Vitals signs and nursing note reviewed.  Constitutional:      General: She is not in acute distress.  Appearance: She is well-developed.  HENT:     Head: Normocephalic and atraumatic.     Jaw: No trismus.     Comments: No facial edema    Right Ear: Tympanic membrane and ear canal normal.     Left Ear: Tympanic membrane and ear canal normal.     Mouth/Throat:     Mouth: Mucous membranes are moist.     Dentition: Dental caries present. No dental abscesses.     Pharynx: Oropharynx is clear. Uvula midline. No posterior oropharyngeal erythema or uvula swelling.     Comments: Tenderness to palpation of the left lower molars.  Partially protruding left lower third molar.  Erythema and edema of the surrounding gingiva without fluctuance or obvious abscess.  Airway is patent.  Uvula is midline and non-edematous. Neck:     Musculoskeletal: Normal range of motion and neck supple.  Cardiovascular:     Rate and Rhythm: Normal rate and regular rhythm.     Heart sounds: Normal heart sounds. No murmur.  Pulmonary:     Effort: Pulmonary effort is normal.      Breath sounds: Normal breath sounds.  Musculoskeletal: Normal range of motion.  Lymphadenopathy:     Cervical: No cervical adenopathy.  Skin:    General: Skin is warm and dry.  Neurological:     General: No focal deficit present.     Mental Status: She is alert.     Sensory: No sensory deficit.     Motor: No abnormal muscle tone.      ED Treatments / Results  Labs (all labs ordered are listed, but only abnormal results are displayed) Labs Reviewed - No data to display  EKG None  Radiology No results found.  Procedures Procedures (including critical care time)  Medications Ordered in ED Medications - No data to display   Initial Impression / Assessment and Plan / ED Course  I have reviewed the triage vital signs and the nursing notes.  Pertinent labs & imaging results that were available during my care of the patient were reviewed by me and considered in my medical decision making (see chart for details).     Patient is uncomfortable appearing but nontoxic.  Airway is patent.  No edema of the face.  Likely impacted third molar.  No concerning symptoms for Ludewig's angina.  Patient agrees to treatment plan with short course of pain medication and antibiotics, referral information provided for local dentistry.  Final Clinical Impressions(s) / ED Diagnoses   Final diagnoses:  Pain, dental    ED Discharge Orders    None       Pauline Ausriplett, Mike Berntsen, PA-C 09/28/18 1251    Vanetta MuldersZackowski, Scott, MD 09/29/18 [phone]

## 2018-09-28 NOTE — Discharge Instructions (Addendum)
Warm salt water rinses.  You may use over-the-counter Orajel as directed.  Ibuprofen 6 to 800 mg every 6-8 hours if needed for pain.  Follow-up with 1 of the dentist on the list provided

## 2018-09-28 NOTE — ED Triage Notes (Signed)
L jaw pain with swelling x 2 days

## 2018-10-31 ENCOUNTER — Emergency Department (HOSPITAL_COMMUNITY)
Admission: EM | Admit: 2018-10-31 | Discharge: 2018-10-31 | Disposition: A | Discharge status: Home / Self Care | Payer: Self-pay | Attending: Emergency Medicine | Admitting: Emergency Medicine

## 2018-10-31 ENCOUNTER — Emergency Department (HOSPITAL_COMMUNITY): Payer: Self-pay

## 2018-10-31 ENCOUNTER — Other Ambulatory Visit: Payer: Self-pay

## 2018-10-31 ENCOUNTER — Encounter (HOSPITAL_COMMUNITY): Payer: Self-pay | Admitting: Emergency Medicine

## 2018-10-31 DIAGNOSIS — Z79899 Other long term (current) drug therapy: Secondary | ICD-10-CM | POA: Insufficient documentation

## 2018-10-31 DIAGNOSIS — Z87891 Personal history of nicotine dependence: Secondary | ICD-10-CM | POA: Insufficient documentation

## 2018-10-31 DIAGNOSIS — I10 Essential (primary) hypertension: Secondary | ICD-10-CM | POA: Insufficient documentation

## 2018-10-31 LAB — BASIC METABOLIC PANEL
Anion gap: 9 (ref 5–15)
BUN: 7 mg/dL (ref 6–20)
CHLORIDE: 102 mmol/L (ref 98–111)
CO2: 21 mmol/L — AB (ref 22–32)
Calcium: 8.8 mg/dL — ABNORMAL LOW (ref 8.9–10.3)
Creatinine, Ser: 0.7 mg/dL (ref 0.44–1.00)
GFR calc Af Amer: >60 mL/min (ref >60)
GFR calc non Af Amer: >60 mL/min (ref >60)
Glucose, Bld: 92 mg/dL (ref 70–99)
Potassium: 3.5 mmol/L (ref 3.5–5.1)
SODIUM: 132 mmol/L — AB (ref 135–145)

## 2018-10-31 LAB — TROPONIN I

## 2018-10-31 MED ORDER — AMLODIPINE BESYLATE 5 MG PO TABS: 5.0000 mg | ORAL_TABLET | Freq: Every day | ORAL | 0 refills | Status: DC

## 2018-10-31 NOTE — ED Provider Notes (Signed)
[facility] EMERGENCY DEPARTMENT Provider Note   CSN: [id number] Arrival date & time: 10/31/18  0741     History   Chief Complaint Chief Complaint  Patient presents with  . Hypertension    HPI Laura Lopez is a 33 y.o. female.  HPI   Laura Lopez is a 33 y.o. female with hx of anxiety, who presents to the Emergency Department complaining of elevated blood pressure and upper chest tightness. Symptoms have been persistent for one week.  She went to a local urgent care to get a screening physical for her new job and told that her BP was high. Since then, she has been randomly checking it and continues to get readings with diastolic around 100.  Systolic as high as 180's.  She reports history of anxiety and thought her symptoms were related to her anxiety.  She denies headaches, visual changes, dizziness and shortness of breath.  No hx of HTN, no recent medication changes, former smoker  Past Medical History:  Diagnosis Date  . Anxiety   . Chronic back pain   . Depression   . GERD (gastroesophageal reflux disease)     Patient Active Problem List   Diagnosis Date Noted  . Major depressive disorder, recurrent episode, moderate (HCC) 01/31/2013    Class: Acute    Past Surgical History:  Procedure Laterality Date  . TONSILLECTOMY    . TUBAL LIGATION    . TUBAL LIGATION       OB History    Gravida  4   Para  3   Term  3   Preterm      AB  1   Living        SAB      TAB  1   Ectopic      Multiple      Live Births               Home Medications    Prior to Admission medications   Medication Sig Start Date End Date Taking? Authorizing Provider  cephALEXin (KEFLEX) 500 MG capsule Take 1 capsule (500 mg total) by mouth 4 (four) times daily. 05/20/18   Terrilee Files, MD  clindamycin (CLEOCIN) 150 MG capsule Take 2 capsules (300 mg total) by mouth 4 (four) times daily. 09/28/18   Lashaun Poch, PA-C  HYDROcodone-acetaminophen  (NORCO/VICODIN) 5-325 MG tablet Take one tab po q 4 hrs prn pain 09/28/18   Romilda Proby, PA-C  metroNIDAZOLE (FLAGYL) 500 MG tablet Take 1 tablet (500 mg total) by mouth 2 (two) times daily. 05/20/18   Terrilee Files, MD    Family History No family history on file.  Social History Social History   Tobacco Use  . Smoking status: Former Games developer  . Smokeless tobacco: Never Used  Substance Use Topics  . Alcohol use: Yes    Alcohol/week: 0.0 standard drinks    Comment: occ  . Drug use: No     Allergies   Patient has no known allergies.   Review of Systems Review of Systems  Constitutional: Negative for chills, fatigue and fever.  HENT: Negative for sore throat and trouble swallowing.   Eyes: Negative for visual disturbance.  Respiratory: Negative for cough, shortness of breath and wheezing.   Cardiovascular: Positive for chest pain. Negative for palpitations.  Gastrointestinal: Negative for abdominal pain, blood in stool, nausea and vomiting.  Genitourinary: Negative for dysuria and flank pain.  Musculoskeletal: Negative for arthralgias, back pain, myalgias, neck pain  and neck stiffness.  Skin: Negative for rash.  Neurological: Negative for dizziness, syncope, speech difficulty, weakness, numbness and headaches.  Hematological: Does not bruise/bleed easily.  Psychiatric/Behavioral: Negative for confusion. The patient is not nervous/anxious.      Physical Exam Updated Vital Signs BP (!) 157/107 (BP Location: Left Arm)   Pulse 98   Temp 98.2 F (36.8 C) (Oral)   Resp 18   Ht 5\' 6"  (1.676 m)   Wt 98.4 kg   LMP 10/29/2018   SpO2 100%   BMI 35.02 kg/m   Physical Exam Vitals signs and nursing note reviewed.  Constitutional:      General: She is not in acute distress.    Appearance: Normal appearance. She is well-developed. She is not ill-appearing.  HENT:     Head: Atraumatic.     Mouth/Throat:     Mouth: Mucous membranes are moist.  Eyes:     Extraocular  Movements: Extraocular movements intact.     Pupils: Pupils are equal, round, and reactive to light.  Neck:     Musculoskeletal: Normal range of motion and neck supple.  Cardiovascular:     Rate and Rhythm: Normal rate and regular rhythm.     Pulses: Normal pulses.     Heart sounds: No murmur.  Pulmonary:     Effort: Pulmonary effort is normal. No respiratory distress.     Breath sounds: Normal breath sounds.  Chest:     Chest wall: No tenderness.  Abdominal:     General: There is no distension.     Palpations: Abdomen is soft.     Tenderness: There is no abdominal tenderness.  Musculoskeletal: Normal range of motion.        General: No tenderness.     Right lower leg: No edema.     Left lower leg: No edema.  Lymphadenopathy:     Cervical: No cervical adenopathy.  Skin:    General: Skin is warm and dry.     Capillary Refill: Capillary refill takes less than 2 seconds.  Neurological:     General: No focal deficit present.     Mental Status: She is alert.     GCS: GCS eye subscore is 4. GCS verbal subscore is 5. GCS motor subscore is 6.     Sensory: Sensation is intact. No sensory deficit.     Motor: Motor function is intact. No weakness or abnormal muscle tone.     Coordination: Coordination normal.     Gait: Gait normal.     Comments: CN II-XII grossly intact. Speech clear.    Psychiatric:        Mood and Affect: Mood normal.      ED Treatments / Results  Labs (all labs ordered are listed, but only abnormal results are displayed) Labs Reviewed  BASIC METABOLIC PANEL - Abnormal; Notable for the following components:      Result Value   Sodium 132 (*)    CO2 21 (*)    Calcium 8.8 (*)    All other components within normal limits  TROPONIN I    EKG EKG Interpretation  Date/Time:  Thursday October 31 2018 08:24:56 EST Ventricular Rate:  92 PR Interval:    QRS Duration: 83 QT Interval:  352 QTC Calculation: 436 R Axis:   21 Text Interpretation:  Sinus rhythm  Confirmed by Benjiman Core [phone]) on 10/31/2018 10:05:54 AM    Radiology Dg Chest 2 View  Result Date: 10/31/2018 CLINICAL DATA:  Chest tightness  and dizziness EXAM: CHEST - 2 VIEW COMPARISON:  06/23/2017 FINDINGS: The heart size and mediastinal contours are within normal limits. Both lungs are clear. The visualized skeletal structures are unremarkable. IMPRESSION: No active cardiopulmonary disease. Electronically Signed   By: Alcide CleverMark  Lukens M.D.   On: 10/31/2018 09:53    Procedures Procedures (including critical care time)  Medications Ordered in ED Medications - No data to display   Initial Impression / Assessment and Plan / ED Course  I have reviewed the triage vital signs and the nursing notes.  Pertinent labs & imaging results that were available during my care of the patient were reviewed by me and considered in my medical decision making (see chart for details).     Pt is well appearing.  Hypertensive here with mild upper chest pain for one week.  No focal neuro deficits. No peripheral edema.  Work up reassuring.  PERC neg, HEART score of 1.  I feel she is appropriate for discharge home, I will start a calcium channel blocker and she agrees to close follow-up with her PCP next week.  Return precautions were discussed.  Final Clinical Impressions(s) / ED Diagnoses   Final diagnoses:  Essential hypertension    ED Discharge Orders    None       Pauline Ausriplett, Kyandra Mcclaine, PA-C 10/31/18 1023    Benjiman CorePickering, Nathan, MD 10/31/18 1459

## 2018-10-31 NOTE — ED Triage Notes (Signed)
Pt has new job physical over a week ago and had elevated BP, she is continue to check over the last week and her BP is always elevated. Diastolic is always over 100. She is noticed some headaches and lightheaded as well.

## 2018-10-31 NOTE — Discharge Instructions (Addendum)
Take the medication daily, call your primary provider to arrange a follow-up appt.

## 2018-11-04 ENCOUNTER — Emergency Department (HOSPITAL_COMMUNITY)
Admission: EM | Admit: 2018-11-04 | Discharge: 2018-11-05 | Disposition: A | Discharge status: Home / Self Care | Payer: Self-pay | Attending: Emergency Medicine | Admitting: Emergency Medicine

## 2018-11-04 ENCOUNTER — Encounter (HOSPITAL_COMMUNITY): Payer: Self-pay | Admitting: Emergency Medicine

## 2018-11-04 ENCOUNTER — Other Ambulatory Visit: Payer: Self-pay

## 2018-11-04 DIAGNOSIS — Z79899 Other long term (current) drug therapy: Secondary | ICD-10-CM | POA: Insufficient documentation

## 2018-11-04 DIAGNOSIS — R51 Headache: Secondary | ICD-10-CM | POA: Insufficient documentation

## 2018-11-04 DIAGNOSIS — I10 Essential (primary) hypertension: Secondary | ICD-10-CM | POA: Insufficient documentation

## 2018-11-04 DIAGNOSIS — Z87891 Personal history of nicotine dependence: Secondary | ICD-10-CM | POA: Insufficient documentation

## 2018-11-04 DIAGNOSIS — R519 Headache, unspecified: Secondary | ICD-10-CM

## 2018-11-04 HISTORY — DX: Essential (primary) hypertension: I10

## 2018-11-04 MED ORDER — PSEUDOEPHEDRINE HCL 60 MG PO TABS
60.0000 mg | ORAL_TABLET | Freq: Once | ORAL | Status: AC
  Administered 2018-11-04: 60 mg via ORAL
  Filled 2018-11-04: qty 1

## 2018-11-04 MED ORDER — KETOROLAC TROMETHAMINE 30 MG/ML IJ SOLN
30.0000 mg | Freq: Once | INTRAMUSCULAR | Status: AC
  Administered 2018-11-04: 30 mg via INTRAMUSCULAR
  Filled 2018-11-04: qty 1

## 2018-11-04 MED ORDER — PROCHLORPERAZINE MALEATE 5 MG PO TABS
5.0000 mg | ORAL_TABLET | Freq: Once | ORAL | Status: AC
  Administered 2018-11-04: 5 mg via ORAL
  Filled 2018-11-04: qty 1

## 2018-11-04 MED ORDER — TRAMADOL HCL 50 MG PO TABS
100.0000 mg | ORAL_TABLET | Freq: Once | ORAL | Status: AC
  Administered 2018-11-04: 100 mg via ORAL
  Filled 2018-11-04: qty 2

## 2018-11-04 NOTE — ED Triage Notes (Signed)
Pt c/o headache with dizziness intermittently since Friday. Pt states she was seen here Friday for the same.

## 2018-11-04 NOTE — ED Provider Notes (Signed)
[facility] EMERGENCY DEPARTMENT Provider Note   CSN: [id number] Arrival date & time: 11/04/18  2152     History   Chief Complaint Chief Complaint  Patient presents with  . Headache    HPI Laura Lopez is a 33 y.o. female.  The history is provided by the patient.  Headache  Pain location:  L temporal and R temporal Quality:  Dull Radiates to:  Does not radiate Severity currently:  8/10 Timing:  Intermittent Progression:  Worsening Chronicity:  Recurrent Relieved by:  Nothing Worsened by:  Nothing Ineffective treatments: took blood pressure pill without improvement. Associated symptoms: dizziness and near-syncope   Associated symptoms: no abdominal pain, no back pain, no congestion, no cough, no eye pain, no neck pain, no photophobia, no seizures and no syncope     Past Medical History:  Diagnosis Date  . Anxiety   . Chronic back pain   . Depression   . GERD (gastroesophageal reflux disease)   . Hypertension     Patient Active Problem List   Diagnosis Date Noted  . Major depressive disorder, recurrent episode, moderate (HCC) 01/31/2013    Class: Acute    Past Surgical History:  Procedure Laterality Date  . TONSILLECTOMY    . TUBAL LIGATION    . TUBAL LIGATION       OB History    Gravida  4   Para  3   Term  3   Preterm      AB  1   Living        SAB      TAB  1   Ectopic      Multiple      Live Births               Home Medications    Prior to Admission medications   Medication Sig Start Date End Date Taking? Authorizing Provider  amLODipine (NORVASC) 5 MG tablet Take 1 tablet (5 mg total) by mouth daily. 10/31/18   Pauline Aus, PA-C    Family History History reviewed. No pertinent family history.  Social History Social History   Tobacco Use  . Smoking status: Former Games developer  . Smokeless tobacco: Never Used  Substance Use Topics  . Alcohol use: Yes    Alcohol/week: 0.0 standard drinks    Comment: occ  .  Drug use: No     Allergies   Patient has no known allergies.   Review of Systems Review of Systems  Constitutional: Negative for activity change.       All ROS Neg except as noted in HPI  HENT: Negative for congestion and nosebleeds.   Eyes: Negative for photophobia, pain and discharge.  Respiratory: Negative for cough, shortness of breath and wheezing.   Cardiovascular: Positive for near-syncope. Negative for chest pain, palpitations and syncope.  Gastrointestinal: Negative for abdominal pain and blood in stool.  Genitourinary: Negative for dysuria, frequency and hematuria.  Musculoskeletal: Negative for arthralgias, back pain and neck pain.  Skin: Negative.   Neurological: Positive for dizziness and headaches. Negative for seizures and speech difficulty.  Psychiatric/Behavioral: Negative for confusion and hallucinations.     Physical Exam Updated Vital Signs BP (!) 137/95   Pulse (!) 102   Temp 99.4 F (37.4 C)   Resp 18   Ht 5\' 6"  (1.676 m)   Wt 98.3 kg   LMP 10/29/2018   SpO2 99%   BMI 34.98 kg/m   Physical Exam Vitals signs and nursing note  reviewed.  Constitutional:      Appearance: She is well-developed. She is not toxic-appearing.  HENT:     Head: Normocephalic.     Right Ear: Tympanic membrane and external ear normal.     Left Ear: Tympanic membrane and external ear normal.  Eyes:     General: Lids are normal.     Pupils: Pupils are equal, round, and reactive to light.  Neck:     Musculoskeletal: Normal range of motion and neck supple.     Vascular: No carotid bruit.  Cardiovascular:     Rate and Rhythm: Normal rate and regular rhythm.     Pulses: Normal pulses.     Heart sounds: Normal heart sounds.  Pulmonary:     Effort: No respiratory distress.     Breath sounds: Normal breath sounds.  Abdominal:     General: Bowel sounds are normal.     Palpations: Abdomen is soft.     Tenderness: There is no abdominal tenderness. There is no guarding.    Musculoskeletal: Normal range of motion.  Lymphadenopathy:     Head:     Right side of head: No submandibular adenopathy.     Left side of head: No submandibular adenopathy.     Cervical: No cervical adenopathy.  Skin:    General: Skin is warm and dry.  Neurological:     Mental Status: She is alert and oriented to person, place, and time.     Cranial Nerves: No cranial nerve deficit.     Sensory: No sensory deficit.  Psychiatric:        Speech: Speech normal.      ED Treatments / Results  Labs (all labs ordered are listed, but only abnormal results are displayed) Labs Reviewed - No data to display  EKG None  Radiology No results found.  Procedures Procedures (including critical care time)  Medications Ordered in ED Medications - No data to display   Initial Impression / Assessment and Plan / ED Course  I have reviewed the triage vital signs and the nursing notes.  Pertinent labs & imaging results that were available during my care of the patient were reviewed by me and considered in my medical decision making (see chart for details).      dMDM Vital signs reviewed.  Pulse oximetry is 99% on room air.  Within normal limits by my interpretation. Patient presented with a problem with headache in both temporal areas.  No history of injury.  She is not sure of any trigger.  She says she does not get headaches too often but this 1 has lingered longer than usual and is caused her to have some dizziness.  No gross neurologic deficits appreciated on examination patient.  Patient is ambulatory without problem.  Patient treated in the emergency department with oral Compazine and Ultram.  As well as intramuscular Toradol.  Recheck.  Patient states the headache is much better.  It is no longer at 8 on a scale of 1-10.  Recheck neurologic examination shows the examination and remained stable.  Patient given a prescription for promethazine and Fioricet.  Patient is to  follow-up with her primary physician.  She will return to the emergency department if any emergent changes in condition, problems, or concerns.   Final diagnoses:  Bad headache    ED Discharge Orders         Ordered    Butalbital-APAP-Caff-Cod 50-300-40-30 MG CAPS     11/05/18 0041  promethazine (PHENERGAN) 12.5 MG tablet  Every 6 hours PRN     11/05/18 0041           Ivery Quale, PA-C 11/05/18 0049    Loren Racer, MD 11/07/18 [phone]

## 2018-11-05 MED ORDER — PROMETHAZINE HCL 12.5 MG PO TABS: 12.5000 mg | ORAL_TABLET | Freq: Four times a day (QID) | ORAL | 0 refills | Status: DC | PRN

## 2018-11-05 MED ORDER — BUTALBITAL-APAP-CAFF-COD 50-300-40-30 MG PO CAPS: ORAL_CAPSULE | ORAL | 0 refills | Status: DC

## 2018-11-05 NOTE — Discharge Instructions (Addendum)
Your neurologic examination is well within normal limits.  During the examination tonight you were noticed to have mild nasal congestion.  Sudafed may be helpful with this especially if it is contributing to your headache.  Use Tylenol extra strength every 4 hours for mild pain.  Use Fioricet-codeine for more severe pain.This medication may cause drowsiness. Please do not drink, drive, or participate in activity that requires concentration while taking this medication.  Use promethazine if needed for nausea/vomiting. This medication may cause drowsiness. Please do not drink, drive, or participate in activity that requires concentration while taking this medication.  Please see Dr. Felecia Shelling for additional evaluation if not improving.

## 2018-12-16 ENCOUNTER — Emergency Department (HOSPITAL_COMMUNITY)
Admission: EM | Admit: 2018-12-16 | Discharge: 2018-12-16 | Disposition: A | Discharge status: Home / Self Care | Payer: Self-pay | Attending: Emergency Medicine | Admitting: Emergency Medicine

## 2018-12-16 ENCOUNTER — Encounter (HOSPITAL_COMMUNITY): Payer: Self-pay

## 2018-12-16 ENCOUNTER — Other Ambulatory Visit: Payer: Self-pay

## 2018-12-16 DIAGNOSIS — J069 Acute upper respiratory infection, unspecified: Secondary | ICD-10-CM | POA: Insufficient documentation

## 2018-12-16 DIAGNOSIS — R070 Pain in throat: Secondary | ICD-10-CM | POA: Insufficient documentation

## 2018-12-16 DIAGNOSIS — B9789 Other viral agents as the cause of diseases classified elsewhere: Secondary | ICD-10-CM | POA: Insufficient documentation

## 2018-12-16 DIAGNOSIS — R0789 Other chest pain: Secondary | ICD-10-CM | POA: Insufficient documentation

## 2018-12-16 DIAGNOSIS — I10 Essential (primary) hypertension: Secondary | ICD-10-CM | POA: Insufficient documentation

## 2018-12-16 DIAGNOSIS — Z79899 Other long term (current) drug therapy: Secondary | ICD-10-CM | POA: Insufficient documentation

## 2018-12-16 DIAGNOSIS — R6883 Chills (without fever): Secondary | ICD-10-CM | POA: Insufficient documentation

## 2018-12-16 DIAGNOSIS — Z87891 Personal history of nicotine dependence: Secondary | ICD-10-CM | POA: Insufficient documentation

## 2018-12-16 LAB — INFLUENZA PANEL BY PCR (TYPE A & B)
Influenza A By PCR: NEGATIVE
Influenza B By PCR: NEGATIVE

## 2018-12-16 LAB — GROUP A STREP BY PCR: Group A Strep by PCR: NOT DETECTED

## 2018-12-16 MED ORDER — IBUPROFEN 800 MG PO TABS: 800.0000 mg | ORAL_TABLET | Freq: Three times a day (TID) | ORAL | 0 refills | Status: DC

## 2018-12-16 MED ORDER — PROMETHAZINE-DM 6.25-15 MG/5ML PO SYRP: 5.0000 mL | ORAL_SOLUTION | Freq: Four times a day (QID) | ORAL | 0 refills | Status: DC | PRN

## 2018-12-16 NOTE — ED Triage Notes (Signed)
Pt has a cough, chest pain due to coughing, chills, and a sore throat. Started 2 days ago. Drank Thera Flu last night with no relief. No fevers at home.

## 2018-12-16 NOTE — Discharge Instructions (Addendum)
Rest, drink plenty of fluids.  Continue taking Tylenol every 4 hours as needed for fever or body aches.  The cough syrup prescribed may cause drowsiness.  Follow-up with your primary care provider for recheck.  Return to the ER for any worsening symptoms.

## 2018-12-18 NOTE — ED Provider Notes (Signed)
[facility] EMERGENCY DEPARTMENT Provider Note   CSN: [id number] Arrival date & time: 12/16/18  1000    History   Chief Complaint Chief Complaint  Patient presents with  . Cough    HPI Laura Lopez is a 33 y.o. female.     HPI   Laura Lopez is a 33 y.o. female who presents to the Emergency Department complaining of cough, sore throat, upper chest pain associated with coughing.  Symptoms began 2 days prior to arrival.  Cough has been occasionally productive of colored sputum.  Throat pain began after onset of cough.  She also endorses having intermittent chills, but denies fever.  She has had recent sick contacts.  Taking OTC cold and flu medication without relief.  She denies shortness of breath, abdominal pain, changes in appetite, diarrhea, vomiting and headache.  No recent travel.    Past Medical History:  Diagnosis Date  . Anxiety   . Chronic back pain   . Depression   . GERD (gastroesophageal reflux disease)   . Hypertension     Patient Active Problem List   Diagnosis Date Noted  . Major depressive disorder, recurrent episode, moderate (HCC) 01/31/2013    Class: Acute    Past Surgical History:  Procedure Laterality Date  . TONSILLECTOMY    . TUBAL LIGATION    . TUBAL LIGATION       OB History    Gravida  4   Para  3   Term  3   Preterm      AB  1   Living        SAB      TAB  1   Ectopic      Multiple      Live Births               Home Medications    Prior to Admission medications   Medication Sig Start Date End Date Taking? Authorizing Provider  amLODipine (NORVASC) 5 MG tablet Take 1 tablet (5 mg total) by mouth daily. 10/31/18   Pauline Aus, PA-C  Butalbital-APAP-Caff-Cod 50-300-40-30 MG CAPS 1 po q6h prn headache 11/05/18   Ivery Quale, PA-C  ibuprofen (ADVIL,MOTRIN) 800 MG tablet Take 1 tablet (800 mg total) by mouth 3 (three) times daily. 12/16/18   Mysti Haley, PA-C  promethazine (PHENERGAN) 12.5  MG tablet Take 1 tablet (12.5 mg total) by mouth every 6 (six) hours as needed. 11/05/18   Ivery Quale, PA-C  promethazine-dextromethorphan (PROMETHAZINE-DM) 6.25-15 MG/5ML syrup Take 5 mLs by mouth 4 (four) times daily as needed. May cause drowsiness 12/16/18   Mihira Tozzi, PA-C    Family History No family history on file.  Social History Social History   Tobacco Use  . Smoking status: Former Games developer  . Smokeless tobacco: Never Used  Substance Use Topics  . Alcohol use: Yes    Alcohol/week: 0.0 standard drinks    Comment: occ  . Drug use: No     Allergies   Patient has no known allergies.   Review of Systems Review of Systems  Constitutional: Positive for chills. Negative for activity change, appetite change and fever.  HENT: Positive for sore throat. Negative for congestion, facial swelling, rhinorrhea and trouble swallowing.   Eyes: Negative for visual disturbance.  Respiratory: Positive for cough. Negative for shortness of breath, wheezing and stridor.   Cardiovascular: Positive for chest pain.  Gastrointestinal: Negative for abdominal pain, nausea and vomiting.  Genitourinary: Negative for decreased urine volume  and dysuria.  Musculoskeletal: Negative for myalgias, neck pain and neck stiffness.  Skin: Negative for rash.  Neurological: Negative for dizziness, weakness, numbness and headaches.  Hematological: Negative for adenopathy.  Psychiatric/Behavioral: Negative for confusion.     Physical Exam Updated Vital Signs BP 140/82   Pulse 79   Temp 98.3 F (36.8 C) (Oral)   Resp 16   Ht 5\' 6"  (1.676 m)   Wt 98.4 kg   LMP 12/09/2018   SpO2 100%   BMI 35.02 kg/m   Physical Exam Vitals signs and nursing note reviewed.  Constitutional:      General: She is not in acute distress.    Appearance: She is well-developed. She is not ill-appearing.  HENT:     Head:     Jaw: No trismus.     Right Ear: Tympanic membrane and ear canal normal.     Left Ear:  Tympanic membrane and ear canal normal.     Nose: Mucosal edema present. No rhinorrhea.     Mouth/Throat:     Pharynx: Uvula midline. Posterior oropharyngeal erythema present. No oropharyngeal exudate or uvula swelling.     Tonsils: No tonsillar abscesses.     Comments: Mild erythema of the oropharynx.  No edema or exudates.  Uvula is midline and non-edematous.   Eyes:     Conjunctiva/sclera: Conjunctivae normal.  Neck:     Musculoskeletal: Normal range of motion and neck supple.     Trachea: Phonation normal.     Meningeal: Kernig's sign absent.  Cardiovascular:     Rate and Rhythm: Normal rate and regular rhythm.     Heart sounds: Normal heart sounds. No murmur.  Pulmonary:     Effort: Pulmonary effort is normal. No respiratory distress.     Breath sounds: Normal breath sounds. No wheezing or rales.  Abdominal:     General: There is no distension.     Palpations: Abdomen is soft.     Tenderness: There is no abdominal tenderness. There is no guarding or rebound.  Musculoskeletal: Normal range of motion.  Lymphadenopathy:     Cervical: No cervical adenopathy.  Skin:    General: Skin is warm.     Findings: No rash.  Neurological:     General: No focal deficit present.     Mental Status: She is alert.     Sensory: No sensory deficit.     Motor: No weakness or abnormal muscle tone.      ED Treatments / Results  Labs (all labs ordered are listed, but only abnormal results are displayed) Labs Reviewed  GROUP A STREP BY PCR  INFLUENZA PANEL BY PCR (TYPE A & B)    EKG None  Radiology No results found.  Procedures Procedures (including critical care time)  Medications Ordered in ED Medications - No data to display   Initial Impression / Assessment and Plan / ED Course  I have reviewed the triage vital signs and the nursing notes.  Pertinent labs & imaging results that were available during my care of the patient were reviewed by me and considered in my medical  decision making (see chart for details).        Pt well appearing.  Vitals reassuring.  Strep and flu testing negative.  Pt appears appropriate for d/c home  and out pt f/u if needed.    Final Clinical Impressions(s) / ED Diagnoses   Final diagnoses:  Viral URI with cough    ED Discharge Orders  Ordered    promethazine-dextromethorphan (PROMETHAZINE-DM) 6.25-15 MG/5ML syrup  4 times daily PRN     12/16/18 1122    ibuprofen (ADVIL,MOTRIN) 800 MG tablet  3 times daily     12/16/18 1122           Pauline Aus, PA-C 12/18/18 1706    Blane Ohara, MD 12/21/18 [phone]

## 2019-06-22 ENCOUNTER — Emergency Department (HOSPITAL_COMMUNITY)
Admission: EM | Admit: 2019-06-22 | Discharge: 2019-06-22 | Disposition: A | Discharge status: Home / Self Care | Payer: Self-pay | Attending: Family Medicine | Admitting: Family Medicine

## 2019-06-22 ENCOUNTER — Encounter (HOSPITAL_COMMUNITY): Payer: Self-pay | Admitting: Emergency Medicine

## 2019-06-22 ENCOUNTER — Other Ambulatory Visit: Payer: Self-pay

## 2019-06-22 DIAGNOSIS — Z7689 Persons encountering health services in other specified circumstances: Secondary | ICD-10-CM

## 2019-06-22 DIAGNOSIS — Z113 Encounter for screening for infections with a predominantly sexual mode of transmission: Secondary | ICD-10-CM | POA: Insufficient documentation

## 2019-06-22 DIAGNOSIS — N760 Acute vaginitis: Secondary | ICD-10-CM | POA: Insufficient documentation

## 2019-06-22 DIAGNOSIS — Z79899 Other long term (current) drug therapy: Secondary | ICD-10-CM | POA: Insufficient documentation

## 2019-06-22 DIAGNOSIS — I10 Essential (primary) hypertension: Secondary | ICD-10-CM | POA: Insufficient documentation

## 2019-06-22 DIAGNOSIS — N3000 Acute cystitis without hematuria: Secondary | ICD-10-CM | POA: Insufficient documentation

## 2019-06-22 DIAGNOSIS — B9689 Other specified bacterial agents as the cause of diseases classified elsewhere: Secondary | ICD-10-CM

## 2019-06-22 DIAGNOSIS — Z87891 Personal history of nicotine dependence: Secondary | ICD-10-CM | POA: Insufficient documentation

## 2019-06-22 LAB — URINALYSIS, ROUTINE W REFLEX MICROSCOPIC
Bilirubin Urine: NEGATIVE
Glucose, UA: NEGATIVE mg/dL
Hgb urine dipstick: NEGATIVE
Ketones, ur: NEGATIVE mg/dL
Nitrite: POSITIVE — AB
Protein, ur: 100 mg/dL — AB
Specific Gravity, Urine: 1.025 (ref 1.005–1.030)
WBC, UA: >50 WBC/hpf — ABNORMAL HIGH (ref 0–5)
pH: 5 (ref 5.0–8.0)

## 2019-06-22 LAB — WET PREP, GENITAL
Sperm: NONE SEEN
Trich, Wet Prep: NONE SEEN
Yeast Wet Prep HPF POC: NONE SEEN

## 2019-06-22 LAB — PREGNANCY, URINE: Preg Test, Ur: NEGATIVE

## 2019-06-22 MED ORDER — CEPHALEXIN 500 MG PO CAPS: 500.0000 mg | ORAL_CAPSULE | Freq: Two times a day (BID) | ORAL | 0 refills | Status: AC

## 2019-06-22 MED ORDER — CEFTRIAXONE SODIUM 250 MG IJ SOLR
250.0000 mg | Freq: Once | INTRAMUSCULAR | Status: AC
  Administered 2019-06-22: 250 mg via INTRAMUSCULAR
  Filled 2019-06-22: qty 250

## 2019-06-22 MED ORDER — METRONIDAZOLE 500 MG PO TABS: 500.0000 mg | ORAL_TABLET | Freq: Two times a day (BID) | ORAL | 0 refills | Status: AC

## 2019-06-22 MED ORDER — LIDOCAINE HCL (PF) 1 % IJ SOLN
0.9000 mL | Freq: Once | INTRAMUSCULAR | Status: AC
  Administered 2019-06-22: 0.9 mL
  Filled 2019-06-22: qty 2

## 2019-06-22 MED ORDER — AZITHROMYCIN 250 MG PO TABS
1000.0000 mg | ORAL_TABLET | Freq: Once | ORAL | Status: AC
  Administered 2019-06-22: 1000 mg via ORAL
  Filled 2019-06-22: qty 4

## 2019-06-22 MED ORDER — ONDANSETRON 4 MG PO TBDP
4.0000 mg | ORAL_TABLET | Freq: Once | ORAL | Status: AC
  Administered 2019-06-22: 4 mg via ORAL
  Filled 2019-06-22: qty 1

## 2019-06-22 NOTE — Discharge Instructions (Addendum)
You were given a prescription for antibiotics. Please take the antibiotic prescription fully. Do not drink alcohol while taking flagyl.   A culture was sent of your urine today to determine if there is any bacterial growth. If the results of the culture are positive and you require an antibiotic or a change of your prescribed antibiotic you will be contacted by the hospital. If the results are negative you will not be contacted.  You have been tested for chlamydia and gonorrhea.  These results will be available in approximately 3 days and you will be contacted by the hospital if the results are positive. Avoid sexual contact until you are aware of the results, and please inform all sexual partners if you test positive for any of these diseases.  Please follow up with your primary care provider within 5-7 days for re-evaluation of your symptoms. If you do not have a primary care provider, information for a healthcare clinic has been provided for you to make arrangements for follow up care. Please return to the emergency department for any new or worsening symptoms.

## 2019-06-22 NOTE — ED Provider Notes (Signed)
[facility] EMERGENCY DEPARTMENT Provider Note   CSN: [id number] Arrival date & time: 06/22/19  1326     History   Chief Complaint Chief Complaint  Patient presents with  . Back Pain    HPI Laura Lopez is a 33 y.o. female.     HPI   Pt is a 33 y/o female with a h/o chronic back pain, anxiety/depression, GERD, HTN who presents to the ED for eval of back pain and pelvic pain. States she started to experience lower back pain a few days ago. She also developed bilat pelvic pain that is sharp and intermittent.   She also reports dysuria, frequency. She also reports a thick vaginal discharge with a mild abnormal odor to it. Denies abd pain, fever, NVD.   States she has been sexually active recently with a female partner without protection. She states she does not think that he has been monogamous. She has h/o chlamydia. She is requesting STD tested but she declines testing for HIV/RPR.   Past Medical History:  Diagnosis Date  . Anxiety   . Chronic back pain   . Depression   . GERD (gastroesophageal reflux disease)   . Hypertension     Patient Active Problem List   Diagnosis Date Noted  . Major depressive disorder, recurrent episode, moderate (HCC) 01/31/2013    Class: Acute    Past Surgical History:  Procedure Laterality Date  . TONSILLECTOMY    . TUBAL LIGATION    . TUBAL LIGATION       OB History    Gravida  4   Para  3   Term  3   Preterm      AB  1   Living        SAB      TAB  1   Ectopic      Multiple      Live Births               Home Medications    Prior to Admission medications   Medication Sig Start Date End Date Taking? Authorizing Provider  amLODipine (NORVASC) 5 MG tablet Take 1 tablet (5 mg total) by mouth daily. 10/31/18   Pauline Aus, PA-C  Butalbital-APAP-Caff-Cod 50-300-40-30 MG CAPS 1 po q6h prn headache 11/05/18   Ivery Quale, PA-C  cephALEXin (KEFLEX) 500 MG capsule Take 1 capsule (500 mg total) by mouth 2  (two) times daily for 7 days. 06/22/19 06/29/19  Kendrix Orman S, PA-C  ibuprofen (ADVIL,MOTRIN) 800 MG tablet Take 1 tablet (800 mg total) by mouth 3 (three) times daily. 12/16/18   Triplett, Tammy, PA-C  metroNIDAZOLE (FLAGYL) 500 MG tablet Take 1 tablet (500 mg total) by mouth 2 (two) times daily for 7 days. 06/22/19 06/29/19  Sreekar Broyhill S, PA-C  promethazine (PHENERGAN) 12.5 MG tablet Take 1 tablet (12.5 mg total) by mouth every 6 (six) hours as needed. 11/05/18   Ivery Quale, PA-C  promethazine-dextromethorphan (PROMETHAZINE-DM) 6.25-15 MG/5ML syrup Take 5 mLs by mouth 4 (four) times daily as needed. May cause drowsiness 12/16/18   Pauline Aus, PA-C    Family History History reviewed. No pertinent family history.  Social History Social History   Tobacco Use  . Smoking status: Former Smoker    Types: Cigarettes  . Smokeless tobacco: Never Used  Substance Use Topics  . Alcohol use: Yes    Alcohol/week: 0.0 standard drinks    Comment: occ  . Drug use: No     Allergies  Patient has no known allergies.   Review of Systems Review of Systems  Constitutional: Negative for fever.  HENT: Negative for ear pain and sore throat.   Eyes: Negative for visual disturbance.  Respiratory: Negative for cough and shortness of breath.   Cardiovascular: Negative for chest pain.  Gastrointestinal: Negative for abdominal pain, diarrhea, nausea and vomiting.  Genitourinary: Positive for dysuria, frequency, pelvic pain and vaginal discharge. Negative for hematuria and vaginal bleeding.  Musculoskeletal: Negative for back pain.  Skin: Negative for rash.  Neurological: Negative for headaches.  All other systems reviewed and are negative.  Physical Exam Updated Vital Signs BP (!) 141/82 (BP Location: Right Arm)   Pulse 89   Temp 98.4 F (36.9 C) (Oral)   Resp 19   Ht 5\' 6"  (1.676 m)   Wt 97.1 kg   LMP 05/29/2019   SpO2 100%   BMI 34.54 kg/m   Physical Exam Vitals signs and  nursing note reviewed.  Constitutional:      General: She is not in acute distress.    Appearance: She is well-developed.  HENT:     Head: Normocephalic and atraumatic.  Eyes:     Conjunctiva/sclera: Conjunctivae normal.  Neck:     Musculoskeletal: Neck supple.  Cardiovascular:     Rate and Rhythm: Normal rate and regular rhythm.     Heart sounds: No murmur.  Pulmonary:     Effort: Pulmonary effort is normal. No respiratory distress.     Breath sounds: Normal breath sounds. No wheezing, rhonchi or rales.  Abdominal:     General: Bowel sounds are normal.     Palpations: Abdomen is soft.     Tenderness: There is no abdominal tenderness. There is no right CVA tenderness, left CVA tenderness or guarding.  Genitourinary:    Comments: Exam performed by Rodney Booze,  exam chaperoned Date: 06/22/2019 Pelvic exam: normal external genitalia without evidence of trauma. VULVA: normal appearing vulva with no masses, tenderness or lesion. VAGINA: normal appearing vagina with normal color and, no lesions. Mild white discharge present.  CERVIX: normal appearing cervix without lesions, cervical motion tenderness absent, cervical os closed; vaginal discharge - white discharge present, Wet prep and DNA probe for chlamydia and GC obtained.   ADNEXA: normal adnexa in size, nontender and no masses UTERUS: uterus is normal size, shape, consistency and is mildly tender Skin:    General: Skin is warm and dry.  Neurological:     Mental Status: She is alert.     ED Treatments / Results  Labs (all labs ordered are listed, but only abnormal results are displayed) Labs Reviewed  WET PREP, GENITAL - Abnormal; Notable for the following components:      Result Value   Clue Cells Wet Prep HPF POC PRESENT (*)    WBC, Wet Prep HPF POC MODERATE (*)    All other components within normal limits  URINALYSIS, ROUTINE W REFLEX MICROSCOPIC - Abnormal; Notable for the following components:   Color, Urine  AMBER (*)    APPearance CLOUDY (*)    Protein, ur 100 (*)    Nitrite POSITIVE (*)    Leukocytes,Ua MODERATE (*)    WBC, UA >50 (*)    Bacteria, UA FEW (*)    All other components within normal limits  URINE CULTURE  PREGNANCY, URINE  GC/CHLAMYDIA PROBE AMP ([place]) NOT AT [facility]    EKG None  Radiology No results found.  Procedures Procedures (including critical care time)  Medications  Ordered in ED Medications  cefTRIAXone (ROCEPHIN) injection 250 mg (250 mg Intramuscular Given 06/22/19 1558)  azithromycin (ZITHROMAX) tablet 1,000 mg (1,000 mg Oral Given 06/22/19 1555)  ondansetron (ZOFRAN-ODT) disintegrating tablet 4 mg (4 mg Oral Given 06/22/19 1558)  lidocaine (PF) (XYLOCAINE) 1 % injection 0.9 mL (0.9 mLs Other Given 06/22/19 1558)     Initial Impression / Assessment and Plan / ED Course  I have reviewed the triage vital signs and the nursing notes.  Pertinent labs & imaging results that were available during my care of the patient were reviewed by me and considered in my medical decision making (see chart for details).     Final Clinical Impressions(s) / ED Diagnoses   Final diagnoses:  Acute cystitis without hematuria  Encounter for assessment of sexually transmitted disease exposure  Bacterial vaginosis   Pt has been diagnosed with a UTI. Pt is afebrile, no CVA tenderness, normotensive, and denies N/V. Pt to be dc home with antibiotics.  Patient also with concern for STD. Pt to be discharged with instructions to follow up with PCP. Discussed importance of using protection when sexually active. Pt understands that they have GC/Chlamydia cultures pending and that they will need to inform all sexual partners if results return positive. Pt has been treated prophylacticly with azithromycin and rocephin due to pts history, pelvic exam, and wet prep with increased WBCs. Pt not concerning for PID because hemodynamically stable and no cervical motion tenderness on pelvic  exam. Pt has also been treated with flagyl for Bacterial Vaginosis. Pt has been advised to not drink alcohol while on this medication.   Advised on return precautions. She voices understanding and is in agreement. All questions answered.    ED Discharge Orders         Ordered    cephALEXin (KEFLEX) 500 MG capsule  2 times daily     06/22/19 1612    metroNIDAZOLE (FLAGYL) 500 MG tablet  2 times daily     06/22/19 1612           Yoneko Talerico S, PA-C 06/22/19 1612    Samuel JesterMcManus, Kathleen, DO 06/26/19 1604

## 2019-06-22 NOTE — ED Triage Notes (Signed)
Patient c/o lower back pain that started 2 days ago without injury. Per patient now radiates into pelvic region. Patient reports white vaginal discharge with some odor. Per patient vaginal pain. Denies any bleeding or fevers. Patient taking AZO with no relief.

## 2019-06-24 LAB — CERVICOVAGINAL ANCILLARY ONLY
Chlamydia: NEGATIVE
Neisseria Gonorrhea: NEGATIVE

## 2019-06-25 LAB — URINE CULTURE: Culture: >=100000 — AB

## 2019-06-26 ENCOUNTER — Telehealth: Payer: Self-pay

## 2019-06-26 NOTE — Telephone Encounter (Signed)
Post ED Visit - Positive Culture Follow-up  Culture report reviewed by antimicrobial stewardship pharmacist: [place] Team []  Elenor Quinones, Pharm.D. []  Heide Guile, Pharm.D., BCPS AQ-ID []  Parks Neptune, Pharm.D., BCPS []  Alycia Rossetti, Pharm.D., BCPS []  [place], Pharm.D., BCPS, AAHIVP []  Legrand Como, Pharm.D., BCPS, AAHIVP []  Salome Arnt, PharmD, BCPS []  Johnnette Gourd, PharmD, BCPS []  Hughes Better, PharmD, BCPS []  Leeroy Cha, PharmD []  Laqueta Linden, PharmD, BCPS []  Albertina Parr, [place] Team []  Leodis Sias, PharmD []  Lindell Spar, PharmD []  Royetta Asal, PharmD []  Graylin Shiver, Rph []  Rema Fendt) Glennon Mac, PharmD []  Arlyn Dunning, PharmD []  Netta Cedars, PharmD []  Dia Sitter, PharmD []  Leone Haven, PharmD []  Gretta Arab, PharmD []  Theodis Shove, PharmD []  Peggyann Juba, PharmD []  Reuel Boom, PharmD   Positive urine culture Treated with Cephalexin, organism sensitive to the same and no further patient follow-up is required at this time.  Genia Del 06/26/2019, 10:48 AM

## 2019-09-08 LAB — CYTOLOGY - PAP

## 2019-09-29 ENCOUNTER — Other Ambulatory Visit: Payer: Self-pay

## 2019-09-29 ENCOUNTER — Ambulatory Visit: Payer: Self-pay | Attending: Internal Medicine

## 2019-09-29 DIAGNOSIS — Z20822 Contact with and (suspected) exposure to covid-19: Secondary | ICD-10-CM

## 2019-10-01 LAB — NOVEL CORONAVIRUS, NAA: SARS-CoV-2, NAA: NOT DETECTED

## 2019-10-15 ENCOUNTER — Other Ambulatory Visit (HOSPITAL_COMMUNITY)
Admission: RE | Admit: 2019-10-15 | Discharge: 2019-10-15 | Disposition: A | Discharge status: Home / Self Care | Payer: Self-pay | Source: Ambulatory Visit | Attending: Nurse Practitioner | Admitting: Nurse Practitioner

## 2019-10-15 DIAGNOSIS — R87610 Atypical squamous cells of undetermined significance on cytologic smear of cervix (ASC-US): Secondary | ICD-10-CM | POA: Insufficient documentation

## 2019-10-17 LAB — SURGICAL PATHOLOGY

## 2019-10-28 ENCOUNTER — Other Ambulatory Visit: Payer: Self-pay

## 2019-10-28 ENCOUNTER — Ambulatory Visit: Payer: Self-pay | Attending: Internal Medicine

## 2019-10-28 DIAGNOSIS — Z20822 Contact with and (suspected) exposure to covid-19: Secondary | ICD-10-CM | POA: Insufficient documentation

## 2019-10-29 ENCOUNTER — Emergency Department (HOSPITAL_COMMUNITY): Payer: Self-pay

## 2019-10-29 ENCOUNTER — Emergency Department (HOSPITAL_COMMUNITY)
Admission: EM | Admit: 2019-10-29 | Discharge: 2019-10-29 | Disposition: A | Discharge status: Home / Self Care | Payer: Self-pay | Attending: Emergency Medicine | Admitting: Emergency Medicine

## 2019-10-29 ENCOUNTER — Other Ambulatory Visit: Payer: Self-pay

## 2019-10-29 ENCOUNTER — Encounter (HOSPITAL_COMMUNITY): Payer: Self-pay | Admitting: Emergency Medicine

## 2019-10-29 DIAGNOSIS — R079 Chest pain, unspecified: Secondary | ICD-10-CM

## 2019-10-29 DIAGNOSIS — R6883 Chills (without fever): Secondary | ICD-10-CM

## 2019-10-29 DIAGNOSIS — I10 Essential (primary) hypertension: Secondary | ICD-10-CM | POA: Insufficient documentation

## 2019-10-29 DIAGNOSIS — B349 Viral infection, unspecified: Secondary | ICD-10-CM | POA: Insufficient documentation

## 2019-10-29 DIAGNOSIS — Z87891 Personal history of nicotine dependence: Secondary | ICD-10-CM | POA: Insufficient documentation

## 2019-10-29 LAB — CBC
HCT: 41 % (ref 36.0–46.0)
Hemoglobin: 14 g/dL (ref 12.0–15.0)
MCH: 30.4 pg (ref 26.0–34.0)
MCHC: 34.1 g/dL (ref 30.0–36.0)
MCV: 89.1 fL (ref 80.0–100.0)
Platelets: 442 10*3/uL — ABNORMAL HIGH (ref 150–400)
RBC: 4.6 MIL/uL (ref 3.87–5.11)
RDW: 13.2 % (ref 11.5–15.5)
WBC: 8.1 10*3/uL (ref 4.0–10.5)
nRBC: 0 % (ref 0.0–0.2)

## 2019-10-29 LAB — BASIC METABOLIC PANEL
Anion gap: 8 (ref 5–15)
BUN: 9 mg/dL (ref 6–20)
CO2: 25 mmol/L (ref 22–32)
Calcium: 9.3 mg/dL (ref 8.9–10.3)
Chloride: 104 mmol/L (ref 98–111)
Creatinine, Ser: 0.61 mg/dL (ref 0.44–1.00)
GFR calc Af Amer: >60 mL/min (ref >60)
GFR calc non Af Amer: >60 mL/min (ref >60)
Glucose, Bld: 88 mg/dL (ref 70–99)
Potassium: 3.9 mmol/L (ref 3.5–5.1)
Sodium: 137 mmol/L (ref 135–145)

## 2019-10-29 LAB — TROPONIN I (HIGH SENSITIVITY): Troponin I (High Sensitivity): <2 ng/L (ref <18)

## 2019-10-29 LAB — POC URINE PREG, ED: Preg Test, Ur: NEGATIVE

## 2019-10-29 LAB — NOVEL CORONAVIRUS, NAA: SARS-CoV-2, NAA: NOT DETECTED

## 2019-10-29 MED ORDER — ALBUTEROL SULFATE HFA 108 (90 BASE) MCG/ACT IN AERS
2.0000 | INHALATION_SPRAY | Freq: Once | RESPIRATORY_TRACT | Status: AC
  Administered 2019-10-29: 14:00:00 2 via RESPIRATORY_TRACT

## 2019-10-29 MED ORDER — ALBUTEROL SULFATE HFA 108 (90 BASE) MCG/ACT IN AERS
INHALATION_SPRAY | RESPIRATORY_TRACT | Status: AC
  Filled 2019-10-29: qty 6.7

## 2019-10-29 NOTE — ED Provider Notes (Signed)
[name]   CSN: [id number] Arrival date & time: 10/29/19  1058     History Chief Complaint  Patient presents with  . Chest Pain    Laura Lopez is a 34 y.o. female with PMHx HTN, GERD, depression, anxiety, chronic back pain who presents to the ED today complaining of gradual onset, constant, worsening, 10/10, chest tightness/pain x 3 days.  She also complains of shortness of breath, nausea, diarrhea, chills, cough.  She reports she went and got tested for Covid yesterday and is currently awaiting her results.  She called her PCP at the health department today regarding her chest pain and was told to come to the ED for further evaluation.  Patient states that she feels like someone is sitting on her chest.  She states it is worse when she lays flat on her back and is relieved when she lays on her side.  She has not taken any pain medication however states she took TheraFlu with mild relief.  She has not checked her temperature.  He is denying any recent COVID-19 positive exposure however she does work in [employer] and states that she frequently has to stop individuals and asked times if it asks on prior to and entering the building.  No recent prolonged travel or immobilization.  No history of DVT/PE.  No active malignancy.  No hemoptysis.  No exogenous hormone use.  The history is provided by the patient and medical records.       Past Medical History:  Diagnosis Date  . Anxiety   . Chronic back pain   . Depression   . GERD (gastroesophageal reflux disease)   . Hypertension     Patient Active Problem List   Diagnosis Date Noted  . Major depressive disorder, recurrent episode, moderate ([place]) 01/31/2013    Class: Acute    Past Surgical History:  Procedure Laterality Date  . TONSILLECTOMY    . TUBAL LIGATION    . TUBAL LIGATION       OB History    Gravida  4   Para  3   Term  3   Preterm      AB  1   Living        SAB       TAB  1   Ectopic      Multiple      Live Births              No family history on file.  Social History   Tobacco Use  . Smoking status: Former Smoker    Types: Cigarettes  . Smokeless tobacco: Never Used  Substance Use Topics  . Alcohol use: Yes    Alcohol/week: 0.0 standard drinks    Comment: occ  . Drug use: No    Home Medications Prior to Admission medications   Medication Sig Start Date End Date Taking? Authorizing Provider  amLODipine (NORVASC) 5 MG tablet Take 1 tablet (5 mg total) by mouth daily. Patient not taking: Reported on 10/29/2019 10/31/18   Kem Parkinson, PA-C  Butalbital-APAP-Caff-Cod 50-300-40-30 MG CAPS 1 po q6h prn headache Patient not taking: Reported on 10/29/2019 11/05/18   Lily Kocher, PA-C  ibuprofen (ADVIL,MOTRIN) 800 MG tablet Take 1 tablet (800 mg total) by mouth 3 (three) times daily. Patient not taking: Reported on 10/29/2019 12/16/18   Kem Parkinson, PA-C  promethazine (PHENERGAN) 12.5 MG tablet Take 1 tablet (12.5 mg total) by mouth every 6 (six) hours as  needed. Patient not taking: Reported on 10/29/2019 11/05/18   Ivery Quale, PA-C  promethazine-dextromethorphan (PROMETHAZINE-DM) 6.25-15 MG/5ML syrup Take 5 mLs by mouth 4 (four) times daily as needed. May cause drowsiness Patient not taking: Reported on 10/29/2019 12/16/18   Pauline Aus, PA-C    Allergies    Patient has no known allergies.  Review of Systems   Review of Systems  Constitutional: Positive for chills and fatigue. Negative for fever.  HENT: Positive for sore throat.   Respiratory: Positive for cough and shortness of breath.   Cardiovascular: Positive for chest pain.  Gastrointestinal: Positive for diarrhea and nausea. Negative for abdominal pain, constipation and vomiting.  Musculoskeletal: Positive for myalgias.  All other systems reviewed and are negative.   Physical Exam Updated Vital Signs BP (!) 150/88 (BP Location: Left Arm)   Pulse 80   Temp  98.4 F (36.9 C) (Oral)   Resp 18   Ht 5\' 6"  (1.676 m)   Wt 99.8 kg   LMP 10/02/2019   SpO2 100%   BMI 35.51 kg/m   Physical Exam Vitals and nursing note reviewed.  Constitutional:      Appearance: She is obese. She is not ill-appearing or diaphoretic.     Comments: Visualized patient ambulating into the ED without difficulty  HENT:     Head: Normocephalic and atraumatic.  Eyes:     Conjunctiva/sclera: Conjunctivae normal.  Cardiovascular:     Rate and Rhythm: Regular rhythm. Tachycardia present.     Pulses:          Radial pulses are 2+ on the right side and 2+ on the left side.       Dorsalis pedis pulses are 2+ on the right side and 2+ on the left side.     Heart sounds: Normal heart sounds.  Pulmonary:     Effort: Pulmonary effort is normal.     Breath sounds: Normal breath sounds. No decreased breath sounds, wheezing, rhonchi or rales.  Chest:     Chest wall: Tenderness present.     Comments: Diffuse chest wall TTP Abdominal:     Palpations: Abdomen is soft.     Tenderness: There is no abdominal tenderness. There is no guarding or rebound.  Musculoskeletal:     Cervical back: Neck supple.     Right lower leg: No tenderness. No edema.     Left lower leg: No tenderness. No edema.  Skin:    General: Skin is warm and dry.  Neurological:     Mental Status: She is alert.     ED Results / Procedures / Treatments   Labs (all labs ordered are listed, but only abnormal results are displayed) Labs Reviewed  CBC - Abnormal; Notable for the following components:      Result Value   Platelets 442 (*)    All other components within normal limits  BASIC METABOLIC PANEL  POC URINE PREG, ED  TROPONIN I (HIGH SENSITIVITY)    EKG EKG Interpretation  Date/Time:  Wednesday October 29 2019 11:25:29 EST Ventricular Rate:  81 PR Interval:  132 QRS Duration: 72 QT Interval:  378 QTC Calculation: 439 R Axis:   76 Text Interpretation: Normal sinus rhythm Septal infarct ,  age undetermined Abnormal ECG since last tracing no significant change Confirmed by 09-30-2005 [id number]) on 10/29/2019 11:48:09 AM   Radiology DG Chest Port 1 View  Result Date: 10/29/2019 CLINICAL DATA:  Chest pain and chills 3 days. EXAM: PORTABLE CHEST 1 VIEW COMPARISON:  10/31/2018 FINDINGS: The heart size and mediastinal contours are within normal limits. Both lungs are clear. The visualized skeletal structures are unremarkable. IMPRESSION: No active disease. Electronically Signed   By: Marlan Palau M.D.   On: 10/29/2019 11:54    Procedures Procedures (including critical care time)  Medications Ordered in ED Medications  albuterol (VENTOLIN HFA) 108 (90 Base) MCG/ACT inhaler 2 puff (2 puffs Inhalation Given 10/29/19 1422)    ED Course  I have reviewed the triage vital signs and the nursing notes.  Pertinent labs & imaging results that were available during my care of the patient were reviewed by me and considered in my medical decision making (see chart for details).  34 year old female who presents to the ED today complaining of diffuse chest tightness/pain for the past 3 days with an assortment of other complaints including chills, body aches, nausea, diarrhea, cough, sore throat.  Was tested for Covid yesterday and awaiting results.  Called the health department regarding her chest pain was told to come to the ED immediately for further evaluation.  She has reproducible chest wall pain diffusely with palpation.  SPECT this is musculoskeletal in nature likely related to coughing.  Patient's assortment of complaints do sound very suspicious for Covid, do not feel the need to resolve at this time as she is currently awaiting test.  She does not personally have a history of CAD or MI but does report family history.  She is currently PERC negative.  I doubt dissection she has equal pulses.  Will obtain screening labs and chest x-ray at this time and ambulate to ensure that her pulse ox  does not desaturate.  Otherwise feel patient will be stable for discharge home.  Chest x-ray clear.  Lab work reassuring.  Troponin less than 2.  Do not feel patient needs a repeat troponin at this time as her symptoms have been ongoing for 3 days and sound very consistent with Covid diagnosis.  She was ambulated in the ED by nursing staff and oxygen saturation remained at 99 to 100% despite complaining of some shortness of breath.  Will discharge home at this time with plan to await Covid test.  Patient advised that she will need to follow-up with her PCP regardless.  Advised that she will need to self isolate for 14 days of positive.  She is in agreement with plan and stable for discharge home.  This note was prepared using Dragon voice recognition software and may include unintentional dictation errors due to the inherent limitations of voice recognition software.  SHARAI OVERBAY was evaluated in Emergency Department on 10/29/2019 for the symptoms described in the history of present illness. She was evaluated in the context of the global COVID-19 pandemic, which necessitated consideration that the patient might be at risk for infection with the SARS-CoV-2 virus that causes COVID-19. Institutional protocols and algorithms that pertain to the evaluation of patients at risk for COVID-19 are in a state of rapid change based on information released by regulatory bodies including the CDC and federal and state organizations. These policies and algorithms were followed during the patient's care in the ED.     MDM Rules/Calculators/A&P                       Final Clinical Impression(s) / ED Diagnoses Final diagnoses:  Viral illness    Rx / DC Orders ED Discharge Orders    None       Discharge Instructions  Your labwork was reassuring today. Your chest x ray did not show any signs of infection. Please remain at home and continue to self isolate until you receive your COVID test results.    Use your albuterol inhaler as needed.   If positive you will need to self isolate for 14 days starting today (cleared: 11/13/2019).  You can take Tylenol and Ibuprofen as needed for your symptoms. This should help with your chest tightness as well.   Return to the ED for any worsening symptoms including worsening chest pain, worsening shortness of breath, dizziness/lightheadedness, vomiting or coughing blood, or any other concerning symptoms.        Tanda Rockers, PA-C 10/29/19 1423    Mancel Bale, MD 10/30/19 1407

## 2019-10-29 NOTE — ED Notes (Signed)
Pt's O2 sats stayed at 99-100% on RA while ambulating.

## 2019-10-29 NOTE — Discharge Instructions (Addendum)
Your labwork was reassuring today. Your chest x ray did not show any signs of infection. Please remain at home and continue to self isolate until you receive your COVID test results.   Use your albuterol inhaler as needed.   If positive you will need to self isolate for 14 days starting today (cleared: 11/13/2019).  You can take Tylenol and Ibuprofen as needed for your symptoms. This should help with your chest tightness as well.   Return to the ED for any worsening symptoms including worsening chest pain, worsening shortness of breath, dizziness/lightheadedness, vomiting or coughing blood, or any other concerning symptoms.

## 2019-10-29 NOTE — ED Triage Notes (Signed)
Pt c/o of central cp x 3 days. Relieved with rest.

## 2019-11-06 ENCOUNTER — Emergency Department (HOSPITAL_COMMUNITY): Payer: Self-pay

## 2019-11-06 ENCOUNTER — Emergency Department (HOSPITAL_COMMUNITY)
Admission: EM | Admit: 2019-11-06 | Discharge: 2019-11-06 | Disposition: A | Discharge status: Home / Self Care | Payer: Self-pay | Attending: Emergency Medicine | Admitting: Emergency Medicine

## 2019-11-06 ENCOUNTER — Encounter (HOSPITAL_COMMUNITY): Payer: Self-pay

## 2019-11-06 DIAGNOSIS — Z87891 Personal history of nicotine dependence: Secondary | ICD-10-CM | POA: Insufficient documentation

## 2019-11-06 DIAGNOSIS — M5432 Sciatica, left side: Secondary | ICD-10-CM | POA: Insufficient documentation

## 2019-11-06 DIAGNOSIS — I10 Essential (primary) hypertension: Secondary | ICD-10-CM | POA: Insufficient documentation

## 2019-11-06 DIAGNOSIS — M79605 Pain in left leg: Secondary | ICD-10-CM

## 2019-11-06 MED ORDER — METHYLPREDNISOLONE SODIUM SUCC 125 MG IJ SOLR
125.0000 mg | Freq: Once | INTRAMUSCULAR | Status: AC
  Administered 2019-11-06: 125 mg via INTRAVENOUS
  Filled 2019-11-06: qty 2

## 2019-11-06 MED ORDER — PREDNISONE 10 MG PO TABS: 20.0000 mg | ORAL_TABLET | Freq: Every day | ORAL | 0 refills | Status: DC

## 2019-11-06 MED ORDER — HYDROMORPHONE HCL 1 MG/ML IJ SOLN
1.0000 mg | Freq: Once | INTRAMUSCULAR | Status: AC
  Administered 2019-11-06: 11:00:00 1 mg via INTRAVENOUS
  Filled 2019-11-06: qty 1

## 2019-11-06 MED ORDER — ONDANSETRON HCL 4 MG/2ML IJ SOLN
4.0000 mg | Freq: Once | INTRAMUSCULAR | Status: AC
  Administered 2019-11-06: 4 mg via INTRAVENOUS
  Filled 2019-11-06: qty 2

## 2019-11-06 MED ORDER — OXYCODONE-ACETAMINOPHEN 5-325 MG PO TABS: 1.0000 | ORAL_TABLET | Freq: Four times a day (QID) | ORAL | 0 refills | Status: DC | PRN

## 2019-11-06 NOTE — ED Provider Notes (Signed)
[facility] EMERGENCY DEPARTMENT Provider Note   CSN: [id number] Arrival date & time: 11/06/19  2500     History Chief Complaint  Patient presents with  . Leg Pain    Laura Lopez is a 34 y.o. female`.  Patient complains of pain down her left leg.  Patient states that she stands a lot at work  The history is provided by the patient. No language interpreter was used.  Leg Pain Location:  Leg Leg location:  L leg Pain details:    Quality:  Aching   Radiates to:  Does not radiate   Severity:  Moderate   Onset quality:  Sudden   Timing:  Constant   Progression:  Worsening Chronicity:  New Dislocation: no   Associated symptoms: no back pain and no fatigue        Past Medical History:  Diagnosis Date  . Anxiety   . Chronic back pain   . Depression   . GERD (gastroesophageal reflux disease)   . Hypertension     Patient Active Problem List   Diagnosis Date Noted  . Major depressive disorder, recurrent episode, moderate (HCC) 01/31/2013    Class: Acute    Past Surgical History:  Procedure Laterality Date  . TONSILLECTOMY    . TUBAL LIGATION    . TUBAL LIGATION       OB History    Gravida  4   Para  3   Term  3   Preterm      AB  1   Living        SAB      TAB  1   Ectopic      Multiple      Live Births              No family history on file.  Social History   Tobacco Use  . Smoking status: Former Smoker    Types: Cigarettes  . Smokeless tobacco: Never Used  Substance Use Topics  . Alcohol use: Yes    Alcohol/week: 0.0 standard drinks    Comment: occ  . Drug use: No    Home Medications Prior to Admission medications   Medication Sig Start Date End Date Taking? Authorizing Provider  amLODipine (NORVASC) 5 MG tablet Take 1 tablet (5 mg total) by mouth daily. Patient not taking: Reported on 10/29/2019 10/31/18   Pauline Aus, PA-C  Butalbital-APAP-Caff-Cod 50-300-40-30 MG CAPS 1 po q6h prn headache Patient not  taking: Reported on 10/29/2019 11/05/18   Ivery Quale, PA-C  ibuprofen (ADVIL,MOTRIN) 800 MG tablet Take 1 tablet (800 mg total) by mouth 3 (three) times daily. Patient not taking: Reported on 10/29/2019 12/16/18   Triplett, Babette Relic, PA-C  oxyCODONE-acetaminophen (PERCOCET/ROXICET) 5-325 MG tablet Take 1 tablet by mouth every 6 (six) hours as needed for up to 20 doses. 11/06/19   Bethann Berkshire, MD  predniSONE (DELTASONE) 10 MG tablet Take 2 tablets (20 mg total) by mouth daily. 11/06/19   Bethann Berkshire, MD  promethazine (PHENERGAN) 12.5 MG tablet Take 1 tablet (12.5 mg total) by mouth every 6 (six) hours as needed. Patient not taking: Reported on 10/29/2019 11/05/18   Ivery Quale, PA-C  promethazine-dextromethorphan (PROMETHAZINE-DM) 6.25-15 MG/5ML syrup Take 5 mLs by mouth 4 (four) times daily as needed. May cause drowsiness Patient not taking: Reported on 10/29/2019 12/16/18   Pauline Aus, PA-C    Allergies    Patient has no known allergies.  Review of Systems   Review of Systems  Constitutional: Negative for appetite change and fatigue.  HENT: Negative for congestion, ear discharge and sinus pressure.   Eyes: Negative for discharge.  Respiratory: Negative for cough.   Cardiovascular: Negative for chest pain.  Gastrointestinal: Negative for abdominal pain and diarrhea.  Genitourinary: Negative for frequency and hematuria.  Musculoskeletal: Negative for back pain.       Leg pain  Skin: Negative for rash.  Neurological: Negative for seizures and headaches.  Psychiatric/Behavioral: Negative for hallucinations.    Physical Exam Updated Vital Signs BP 134/89 (BP Location: Right Arm)   Pulse (!) 103   Temp 98.4 F (36.9 C) (Oral)   Resp 15   Ht 5\' 6"  (1.676 m)   Wt 99.8 kg   LMP 10/30/2019   SpO2 99%   BMI 35.51 kg/m   Physical Exam Vitals and nursing note reviewed.  Constitutional:      Appearance: She is well-developed.  HENT:     Head: Normocephalic.     Nose: Nose  normal.  Eyes:     General: No scleral icterus.    Conjunctiva/sclera: Conjunctivae normal.  Neck:     Thyroid: No thyromegaly.  Cardiovascular:     Rate and Rhythm: Normal rate and regular rhythm.     Heart sounds: No murmur. No friction rub. No gallop.   Pulmonary:     Breath sounds: No stridor. No wheezing or rales.  Chest:     Chest wall: No tenderness.  Abdominal:     General: There is no distension.     Tenderness: There is no abdominal tenderness. There is no rebound.  Musculoskeletal:        General: Normal range of motion.     Cervical back: Neck supple.     Comments: Pain in left leg with positive straight leg raise  Lymphadenopathy:     Cervical: No cervical adenopathy.  Skin:    Findings: No erythema or rash.  Neurological:     Mental Status: She is alert and oriented to person, place, and time.     Motor: No abnormal muscle tone.     Coordination: Coordination normal.  Psychiatric:        Behavior: Behavior normal.     ED Results / Procedures / Treatments   Labs (all labs ordered are listed, but only abnormal results are displayed) Labs Reviewed - No data to display  EKG None  Radiology DG Lumbar Spine Complete  Result Date: 11/06/2019 CLINICAL DATA:  Low back pain and left leg pain EXAM: LUMBAR SPINE - COMPLETE 4+ VIEW COMPARISON:  01/21/2008 FINDINGS: There are 6 non rib-bearing lumbar type vertebral segments. There is no evidence of lumbar spine fracture. Alignment is normal. Intervertebral disc spaces are maintained. Facet joints appear normal without degenerative findings. Unremarkable SI joints. IMPRESSION: No acute findings or significant degenerative changes of the lumbar spine. Electronically Signed   By: 01/23/2008 D.O.   On: 11/06/2019 11:29    Procedures Procedures (including critical care time)  Medications Ordered in ED Medications  HYDROmorphone (DILAUDID) injection 1 mg (1 mg Intravenous Given 11/06/19 1055)  ondansetron (ZOFRAN)  injection 4 mg (4 mg Intravenous Given 11/06/19 1050)  methylPREDNISolone sodium succinate (SOLU-MEDROL) 125 mg/2 mL injection 125 mg (125 mg Intravenous Given 11/06/19 1057)    ED Course  I have reviewed the triage vital signs and the nursing notes.  Pertinent labs & imaging results that were available during my care of the patient were reviewed by me and considered in my  medical decision making (see chart for details).    MDM Rules/Calculators/A&P                      Patient with left leg sciatica.  She is placed on prednisone and pain medicine and will rest at home until Monday and follow-up next week Final Clinical Impression(s) / ED Diagnoses Final diagnoses:  Left leg pain    Rx / DC Orders ED Discharge Orders         Ordered    oxyCODONE-acetaminophen (PERCOCET/ROXICET) 5-325 MG tablet  Every 6 hours PRN     11/06/19 1200    predniSONE (DELTASONE) 10 MG tablet  Daily     11/06/19 1200           Milton Ferguson, MD 11/06/19 1211

## 2019-11-06 NOTE — ED Triage Notes (Signed)
Pt having left leg pain that starts at left hip and radiates down to toes. States her toes feel "tingly." Pain started yesterday. Ambulatory with unsteady gait. No history of sciatica.

## 2019-11-06 NOTE — Discharge Instructions (Signed)
Follow-up with your family doctor or Dr. Romeo Apple in 1 to 2 weeks

## 2019-12-11 ENCOUNTER — Other Ambulatory Visit: Payer: Self-pay

## 2019-12-11 ENCOUNTER — Ambulatory Visit: Payer: HRSA Program | Attending: Internal Medicine

## 2019-12-11 DIAGNOSIS — Z20822 Contact with and (suspected) exposure to covid-19: Secondary | ICD-10-CM | POA: Diagnosis present

## 2019-12-12 LAB — NOVEL CORONAVIRUS, NAA: SARS-CoV-2, NAA: NOT DETECTED

## 2020-02-11 ENCOUNTER — Other Ambulatory Visit: Payer: Self-pay

## 2020-02-11 ENCOUNTER — Ambulatory Visit: Payer: Self-pay | Attending: Internal Medicine

## 2020-02-11 DIAGNOSIS — Z20822 Contact with and (suspected) exposure to covid-19: Secondary | ICD-10-CM | POA: Insufficient documentation

## 2020-02-12 LAB — NOVEL CORONAVIRUS, NAA: SARS-CoV-2, NAA: NOT DETECTED

## 2020-02-12 LAB — SARS-COV-2, NAA 2 DAY TAT

## 2020-02-23 ENCOUNTER — Ambulatory Visit: Payer: Self-pay

## 2020-02-23 DIAGNOSIS — Z23 Encounter for immunization: Secondary | ICD-10-CM

## 2020-02-23 NOTE — Progress Notes (Signed)
   Covid-19 Vaccination Clinic  Name:  Laura Lopez    MRN: [id number] DOB: [birth year]  02/23/2020  [name] was observed post Covid-19 immunization for 15 minutes without incident. She was provided with Vaccine Information Sheet and instruction to access the V-Safe system.   [name] was instructed to call 911 with any severe reactions post vaccine: Marland Kitchen Difficulty breathing  . Swelling of face and throat  . A fast heartbeat  . A bad rash all over body  . Dizziness and weakness   Immunizations Administered    Name Date Dose VIS Date Route   Moderna COVID-19 Vaccine 02/23/2020  1:49 PM 0.5 mL 09/2019 Intramuscular   Manufacturer: Moderna   Lot: [id number]   NDC: 68257-493-55

## 2020-03-22 ENCOUNTER — Ambulatory Visit: Payer: Self-pay

## 2020-03-22 DIAGNOSIS — Z23 Encounter for immunization: Secondary | ICD-10-CM

## 2020-03-22 NOTE — Progress Notes (Signed)
   Covid-19 Vaccination Clinic  Name:  Laura Lopez    MRN: [id number] DOB: Sep 15, 1986  03/22/2020  [name] was observed post Covid-19 immunization for 15 minutes without incident. She was provided with Vaccine Information Sheet and instruction to access the V-Safe system.   [name] was instructed to call 911 with any severe reactions post vaccine: Marland Kitchen Difficulty breathing  . Swelling of face and throat  . A fast heartbeat  . A bad rash all over body  . Dizziness and weakness   Immunizations Administered    Name Date Dose VIS Date Route   Moderna COVID-19 Vaccine 03/22/2020  1:50 PM 0.5 mL 09/2019 Intramuscular   Manufacturer: Moderna   Lot: [id number]   NDC: 37542-370-23

## 2020-04-23 ENCOUNTER — Encounter: Payer: Self-pay | Admitting: Emergency Medicine

## 2020-04-23 ENCOUNTER — Ambulatory Visit
Admission: EM | Admit: 2020-04-23 | Discharge: 2020-04-23 | Disposition: A | Discharge status: Home / Self Care | Payer: Self-pay | Attending: Emergency Medicine | Admitting: Emergency Medicine

## 2020-04-23 DIAGNOSIS — Z20822 Contact with and (suspected) exposure to covid-19: Secondary | ICD-10-CM

## 2020-04-23 NOTE — Discharge Instructions (Signed)

## 2020-04-23 NOTE — ED Triage Notes (Signed)
covid exposure from family member

## 2020-04-23 NOTE — ED Provider Notes (Addendum)
[facility] CARE CENTER   [id number] 04/23/20 Arrival Time: 1236   CC: COVID exposure  SUBJECTIVE: History from: patient.  NAESHA BUCKALEW is a 34 y.o. female who presents for COVID testing.  COVID exposure today.  Denies recent travel.  Denies aggravating or alleviating symptoms.  Denies previous COVID infection.   Denies fever, chills, fatigue, nasal congestion, rhinorrhea, sore throat, cough, SOB, wheezing, chest pain, nausea, vomiting, changes in bowel or bladder habits.     ROS: As per HPI.  All other pertinent ROS negative.     Past Medical History:  Diagnosis Date   Anxiety    Chronic back pain    Depression    GERD (gastroesophageal reflux disease)    Hypertension    Past Surgical History:  Procedure Laterality Date   TONSILLECTOMY     TUBAL LIGATION     TUBAL LIGATION     No Known Allergies No current facility-administered medications on file prior to encounter.   Current Outpatient Medications on File Prior to Encounter  Medication Sig Dispense Refill   amLODipine (NORVASC) 5 MG tablet Take 1 tablet (5 mg total) by mouth daily. (Patient not taking: Reported on 10/29/2019) 30 tablet 0   Butalbital-APAP-Caff-Cod 50-300-40-30 MG CAPS 1 po q6h prn headache (Patient not taking: Reported on 10/29/2019) 12 capsule 0   ibuprofen (ADVIL,MOTRIN) 800 MG tablet Take 1 tablet (800 mg total) by mouth 3 (three) times daily. (Patient not taking: Reported on 10/29/2019) 21 tablet 0   oxyCODONE-acetaminophen (PERCOCET/ROXICET) 5-325 MG tablet Take 1 tablet by mouth every 6 (six) hours as needed for up to 20 doses. 20 tablet 0   predniSONE (DELTASONE) 10 MG tablet Take 2 tablets (20 mg total) by mouth daily. 14 tablet 0   promethazine (PHENERGAN) 12.5 MG tablet Take 1 tablet (12.5 mg total) by mouth every 6 (six) hours as needed. (Patient not taking: Reported on 10/29/2019) 10 tablet 0   promethazine-dextromethorphan (PROMETHAZINE-DM) 6.25-15 MG/5ML syrup Take 5 mLs by  mouth 4 (four) times daily as needed. May cause drowsiness (Patient not taking: Reported on 10/29/2019) 118 mL 0   Social History   Socioeconomic History   Marital status: Single    Spouse name: Not on file   Number of children: Not on file   Years of education: Not on file   Highest education level: Not on file  Occupational History   Not on file  Tobacco Use   Smoking status: Former Smoker    Types: Cigarettes   Smokeless tobacco: Never Used  Substance and Sexual Activity   Alcohol use: Yes    Alcohol/week: 0.0 standard drinks    Comment: occ   Drug use: No   Sexual activity: Yes    Birth control/protection: Surgical  Other Topics Concern   Not on file  Social History Narrative   Not on file   Social Determinants of Health   Financial Resource Strain:    Difficulty of Paying Living Expenses:   Food Insecurity:    Worried About Programme researcher, broadcasting/film/video in the Last Year:    Barista in the Last Year:   Transportation Needs:    Freight forwarder (Medical):    Lack of Transportation (Non-Medical):   Physical Activity:    Days of Exercise per Week:    Minutes of Exercise per Session:   Stress:    Feeling of Stress :   Social Connections:    Frequency of Communication with Friends and Family:  Frequency of Social Gatherings with Friends and Family:    Attends Religious Services:    Active Member of Clubs or Organizations:    Attends Engineer, structural:    Marital Status:   Intimate Partner Violence:    Fear of Current or Ex-Partner:    Emotionally Abused:    Physically Abused:    Sexually Abused:    History reviewed. No pertinent family history.  OBJECTIVE:  Vitals:   04/23/20 1245  BP: 126/81  Pulse: 100  Resp: 16  Temp: 98.1 F (36.7 C)  TempSrc: Oral  SpO2: 98%     General appearance: alert; well-appearing, nontoxic; speaking in full sentences and tolerating own secretions HEENT: NCAT; Ears: EACs  clear, TMs pearly gray; Eyes: PERRL.  EOM grossly intact.Nose: nares patent without rhinorrhea, Throat: oropharynx clear, tonsils non erythematous or enlarged, uvula midline  Neck: supple without LAD Lungs: unlabored respirations, symmetrical air entry; cough: absent; no respiratory distress; CTAB Heart: regular rate and rhythm.  Skin: warm and dry Psychological: alert and cooperative; normal mood and affect   ASSESSMENT & PLAN:  1. Exposure to COVID-19 virus     COVID testing ordered.  It will take between 2-5 days for test results.  Someone will contact you regarding abnormal results.    In the meantime:  If you were to develop symptoms: You should remain isolated in your home for 10 days from symptom onset AND greater than 72 hours after symptoms resolution (absence of fever without the use of fever-reducing medication and improvement in respiratory symptoms), whichever is longer If you have had exposure: you should remain in quarantine for 7 days from exposure.  However, if symptoms develop you must self isolate and return for retesting Get plenty of rest and push fluids Follow up with PCP as needed Call or go to the ED if you have any new symptoms such as fever, cough, shortness of breath, chest tightness, chest pain, turning blue, changes in mental status, etc...   Reviewed expectations re: course of current medical issues. Questions answered. Outlined signs and symptoms indicating need for more acute intervention. Patient verbalized understanding. After Visit Summary given.         Rennis Harding, PA-C 04/23/20 1259    Alvino Chapel [place], PA-C 04/23/20 1301

## 2020-04-24 LAB — NOVEL CORONAVIRUS, NAA: SARS-CoV-2, NAA: NOT DETECTED

## 2020-04-24 LAB — SARS-COV-2, NAA 2 DAY TAT

## 2020-05-03 ENCOUNTER — Ambulatory Visit
Admission: EM | Admit: 2020-05-03 | Discharge: 2020-05-03 | Disposition: A | Discharge status: Home / Self Care | Payer: Self-pay | Attending: Emergency Medicine | Admitting: Emergency Medicine

## 2020-05-03 ENCOUNTER — Other Ambulatory Visit: Payer: Self-pay

## 2020-05-03 DIAGNOSIS — N898 Other specified noninflammatory disorders of vagina: Secondary | ICD-10-CM | POA: Insufficient documentation

## 2020-05-03 MED ORDER — METRONIDAZOLE 500 MG PO TABS: 500.0000 mg | ORAL_TABLET | Freq: Two times a day (BID) | ORAL | 0 refills | Status: DC

## 2020-05-03 NOTE — ED Provider Notes (Signed)
[facility] CARE CENTER   [id number] 05/03/20 Arrival Time: 1653   CC: VAGINAL DISCHARGE  SUBJECTIVE:  Laura Lopez is a 34 y.o. female who presents with complaint of white stringy vaginal discharge with fishy odor x 2 days.  Admits to changing soaps.  Last sex 1 month ago.  Has been sexually active with 1 female partner over the past 6 months.  Describes discharge as thin and stringy.  Denies alleviating or aggravating factors.  She reports similar symptoms in the past and was diagnosed with BV.  She denies fever, chills, nausea, vomiting, abdominal or pelvic pain, urinary symptoms, vaginal itching, vaginal bleeding, dyspareunia, vaginal rashes or lesions.   No LMP recorded (within weeks). tubal ligation  ROS: As per HPI.  All other pertinent ROS negative.     Past Medical History:  Diagnosis Date   Anxiety    Chronic back pain    Depression    GERD (gastroesophageal reflux disease)    Hypertension    Past Surgical History:  Procedure Laterality Date   TONSILLECTOMY     TUBAL LIGATION     TUBAL LIGATION     No Known Allergies No current facility-administered medications on file prior to encounter.   Current Outpatient Medications on File Prior to Encounter  Medication Sig Dispense Refill   [DISCONTINUED] amLODipine (NORVASC) 5 MG tablet Take 1 tablet (5 mg total) by mouth daily. (Patient not taking: Reported on 10/29/2019) 30 tablet 0   [DISCONTINUED] promethazine (PHENERGAN) 12.5 MG tablet Take 1 tablet (12.5 mg total) by mouth every 6 (six) hours as needed. (Patient not taking: Reported on 10/29/2019) 10 tablet 0    Social History   Socioeconomic History   Marital status: Single    Spouse name: Not on file   Number of children: Not on file   Years of education: Not on file   Highest education level: Not on file  Occupational History   Not on file  Tobacco Use   Smoking status: Former Smoker    Types: Cigarettes   Smokeless tobacco: Never Used    Substance and Sexual Activity   Alcohol use: Yes    Alcohol/week: 0.0 standard drinks    Comment: occ   Drug use: No   Sexual activity: Yes    Birth control/protection: Surgical  Other Topics Concern   Not on file  Social History Narrative   Not on file   Social Determinants of Health   Financial Resource Strain:    Difficulty of Paying Living Expenses:   Food Insecurity:    Worried About Programme researcher, broadcasting/film/video in the Last Year:    Barista in the Last Year:   Transportation Needs:    Freight forwarder (Medical):    Lack of Transportation (Non-Medical):   Physical Activity:    Days of Exercise per Week:    Minutes of Exercise per Session:   Stress:    Feeling of Stress :   Social Connections:    Frequency of Communication with Friends and Family:    Frequency of Social Gatherings with Friends and Family:    Attends Religious Services:    Active Member of Clubs or Organizations:    Attends Engineer, structural:    Marital Status:   Intimate Partner Violence:    Fear of Current or Ex-Partner:    Emotionally Abused:    Physically Abused:    Sexually Abused:    History reviewed. No pertinent family history.  OBJECTIVE:  Vitals:   05/03/20 1703  BP: 128/81  Pulse: 98  Resp: 17  Temp: 98.2 F (36.8 C)  TempSrc: Oral  SpO2: 97%    General appearance: Alert, NAD, appears stated age Head: NCAT Throat: lips, mucosa, and tongue normal; teeth and gums normal Lungs: CTA bilaterally without adventitious breath sounds Heart: regular rate and rhythm.   Back: no CVA tenderness Abdomen: soft, non-tender; bowel sounds normal; no guarding GU: deferred Skin: warm and dry Psychological:  Alert and cooperative. Normal mood and affect.  ASSESSMENT & PLAN:  1. Vaginal discharge   2. Vaginal odor     Meds ordered this encounter  Medications   metroNIDAZOLE (FLAGYL) 500 MG tablet    Sig: Take 1 tablet (500 mg total) by mouth 2  (two) times daily.    Dispense:  14 tablet    Refill:  0    Order Specific Question:   Supervising Provider    Answer:   Eustace Moore [[id number]]    Pending: Labs Reviewed - No data to display  Vaginal self-swab obtained.  We will follow up with you regarding abnormal results Declines HIV/ syphilis testing today Prescribed metronidazole 500 mg twice daily for 7 days (do not take while consuming alcohol and/or if breastfeeding) If tests results are positive, please abstain from sexual activity until you and your partner(s) have been treated Follow up with PCP Return here or go to ER if you have any new or worsening symptoms fever, chills, nausea, vomiting, abdominal or pelvic pain, painful intercourse, vaginal discharge, vaginal bleeding, persistent symptoms despite treatment, etc...  Reviewed expectations re: course of current medical issues. Questions answered. Outlined signs and symptoms indicating need for more acute intervention. Patient verbalized understanding. After Visit Summary given.       Rennis Harding, PA-C 05/03/20 1719

## 2020-05-03 NOTE — Discharge Instructions (Signed)
Vaginal self-swab obtained.  We will follow up with you regarding abnormal results Declines HIV/ syphilis testing today Prescribed metronidazole 500 mg twice daily for 7 days (do not take while consuming alcohol and/or if breastfeeding) If tests results are positive, please abstain from sexual activity until you and your partner(s) have been treated Follow up with PCP Return here or go to ER if you have any new or worsening symptoms fever, chills, nausea, vomiting, abdominal or pelvic pain, painful intercourse, vaginal discharge, vaginal bleeding, persistent symptoms despite treatment, etc..Marland Kitchen

## 2020-05-03 NOTE — ED Triage Notes (Signed)
Pt presents with vaginal itching, white vaginal discharge with fishy smell x 2 days.

## 2020-05-04 LAB — CERVICOVAGINAL ANCILLARY ONLY
Bacterial Vaginitis (gardnerella): POSITIVE — AB
Candida Glabrata: NEGATIVE
Candida Vaginitis: NEGATIVE
Chlamydia: NEGATIVE
Comment: NEGATIVE
Comment: NEGATIVE
Comment: NEGATIVE
Comment: NEGATIVE
Comment: NEGATIVE
Comment: NORMAL
Neisseria Gonorrhea: NEGATIVE
Trichomonas: NEGATIVE

## 2020-06-16 ENCOUNTER — Ambulatory Visit
Admission: EM | Admit: 2020-06-16 | Discharge: 2020-06-16 | Disposition: A | Discharge status: Home / Self Care | Payer: Self-pay

## 2020-06-16 ENCOUNTER — Other Ambulatory Visit: Payer: Self-pay

## 2020-06-16 NOTE — ED Triage Notes (Signed)
Pt presents with right shoulder pain that developed 2 days ago and became worse today, pt reports a sharp pain in right chest and shoulder area. Pt tearful . VS stable

## 2020-07-01 ENCOUNTER — Other Ambulatory Visit: Payer: Self-pay

## 2020-09-29 ENCOUNTER — Encounter: Payer: Self-pay | Admitting: Emergency Medicine

## 2020-09-29 ENCOUNTER — Ambulatory Visit
Admission: EM | Admit: 2020-09-29 | Discharge: 2020-09-29 | Disposition: A | Discharge status: Home / Self Care | Payer: Self-pay | Attending: Family Medicine | Admitting: Family Medicine

## 2020-09-29 ENCOUNTER — Other Ambulatory Visit: Payer: Self-pay

## 2020-09-29 DIAGNOSIS — R6883 Chills (without fever): Secondary | ICD-10-CM

## 2020-09-29 DIAGNOSIS — R52 Pain, unspecified: Secondary | ICD-10-CM

## 2020-09-29 DIAGNOSIS — R059 Cough, unspecified: Secondary | ICD-10-CM

## 2020-09-29 DIAGNOSIS — R5383 Other fatigue: Secondary | ICD-10-CM

## 2020-09-29 DIAGNOSIS — J3489 Other specified disorders of nose and nasal sinuses: Secondary | ICD-10-CM

## 2020-09-29 DIAGNOSIS — R062 Wheezing: Secondary | ICD-10-CM

## 2020-09-29 DIAGNOSIS — B349 Viral infection, unspecified: Secondary | ICD-10-CM

## 2020-09-29 MED ORDER — PROMETHAZINE-DM 6.25-15 MG/5ML PO SYRP: 5.0000 mL | ORAL_SOLUTION | Freq: Four times a day (QID) | ORAL | 0 refills | Status: DC | PRN

## 2020-09-29 MED ORDER — PREDNISONE 10 MG (21) PO TBPK: ORAL_TABLET | Freq: Every day | ORAL | 0 refills | Status: AC

## 2020-09-29 NOTE — Discharge Instructions (Addendum)
I have prescribed promethazine syrup for you to take twice a day as needed for cough.  This medication can make you sleepy.  Do not drive while you are taking this medication.  I have sent in a prednisone taper for you to take for 6 days. 6 tablets on day one, 5 tablets on day two, 4 tablets on day three, 3 tablets on day four, 2 tablets on day five, and 1 tablet on day six.  Your COVID and Flu tests are pending.  You should self quarantine until the test results are back.    Take Tylenol or ibuprofen as needed for fever or discomfort.  Rest and keep yourself hydrated.    Follow-up with your primary care provider if your symptoms are not improving.

## 2020-09-29 NOTE — ED Triage Notes (Signed)
Cough, chills and congestion since Tuesday

## 2020-09-29 NOTE — ED Provider Notes (Signed)
[facility] CARE CENTER   [id number] 09/29/20 Arrival Time: 1135   CC: COVID symptoms  SUBJECTIVE: History from: patient.  Laura Lopez is a 34 y.o. female who presents with abrupt onset of nasal congestion, PND, chills, sweats, sore throat, fatigue, and persistent dry cough for the last 3 days. Denies sick exposure to COVID, flu or strep. Denies recent travel. Has positive history of Covid 4 months ago. Has completed Covid vaccines. Has taken OTC cough and cold medications for this with no relief. There are no aggravating or alleviating factors. Denies previous symptoms in the past. Denies fever, SOB, wheezing, chest pain, nausea, changes in bowel or bladder habits.    ROS: As per HPI.  All other pertinent ROS negative.     Past Medical History:  Diagnosis Date   Anxiety    Chronic back pain    Depression    GERD (gastroesophageal reflux disease)    Hypertension    Past Surgical History:  Procedure Laterality Date   TONSILLECTOMY     TUBAL LIGATION     TUBAL LIGATION     No Known Allergies No current facility-administered medications on file prior to encounter.   Current Outpatient Medications on File Prior to Encounter  Medication Sig Dispense Refill   metroNIDAZOLE (FLAGYL) 500 MG tablet Take 1 tablet (500 mg total) by mouth 2 (two) times daily. 14 tablet 0   [DISCONTINUED] amLODipine (NORVASC) 5 MG tablet Take 1 tablet (5 mg total) by mouth daily. (Patient not taking: Reported on 10/29/2019) 30 tablet 0   [DISCONTINUED] promethazine (PHENERGAN) 12.5 MG tablet Take 1 tablet (12.5 mg total) by mouth every 6 (six) hours as needed. (Patient not taking: Reported on 10/29/2019) 10 tablet 0   Social History   Socioeconomic History   Marital status: Single    Spouse name: Not on file   Number of children: Not on file   Years of education: Not on file   Highest education level: Not on file  Occupational History   Not on file  Tobacco Use   Smoking  status: Former Smoker    Types: Cigarettes   Smokeless tobacco: Never Used  Substance and Sexual Activity   Alcohol use: Yes    Alcohol/week: 0.0 standard drinks    Comment: occ   Drug use: No   Sexual activity: Yes    Birth control/protection: Surgical  Other Topics Concern   Not on file  Social History Narrative   Not on file   Social Determinants of Health   Financial Resource Strain: Not on file  Food Insecurity: Not on file  Transportation Needs: Not on file  Physical Activity: Not on file  Stress: Not on file  Social Connections: Not on file  Intimate Partner Violence: Not on file   No family history on file.  OBJECTIVE:  Vitals:   09/29/20 1307  BP: (!) 155/94  Pulse: 100  Resp: 16  Temp: 98.4 F (36.9 C)  SpO2: 97%     General appearance: alert; appears fatigued, but nontoxic; speaking in full sentences and tolerating own secretions HEENT: NCAT; Ears: EACs clear, TMs pearly gray; Eyes: PERRL.  EOM grossly intact. Sinuses: Frontal and maxillary sinus tenderness; Nose: nares patent without rhinorrhea, Throat: oropharynx erythematous, cobblestoning present, tonsils non erythematous or enlarged, uvula midline  Neck: supple without LAD Lungs: unlabored respirations, symmetrical air entry; cough: moderate; no respiratory distress; wheezing noted to bilateral lower lobes Heart: regular rate and rhythm.  Radial pulses 2+ symmetrical bilaterally Skin: warm  and dry Psychological: alert and cooperative; normal mood and affect  LABS:  No results found for this or any previous visit (from the past 24 hour(s)).   ASSESSMENT & PLAN:  1. Viral illness   2. Cough   3. Sinus pain   4. Body aches   5. Chills   6. Other fatigue   7. Rhinorrhea   8. Wheezing    Continue supportive care at home Prescribe promethazine syrup for cough Cough sedation precautions given Steroid taper prescribed COVID and flu testing ordered.  It will take between 1-2 days for test  results.  Someone will contact you regarding abnormal results.   Work note provided Patient should remain in quarantine until they have received Covid results.  If negative you may resume normal activities (go back to work/school) while practicing hand hygiene, social distance, and mask wearing.  If positive, patient should remain in quarantine for 10 days from symptom onset AND greater than 72 hours after symptoms resolution (absence of fever without the use of fever-reducing medication and improvement in respiratory symptoms), whichever is longer Get plenty of rest and push fluids Use OTC zyrtec for nasal congestion, runny nose, and/or sore throat Use OTC flonase for nasal congestion and runny nose Use medications daily for symptom relief Use OTC medications like ibuprofen or tylenol as needed fever or pain Call or go to the ED if you have any new or worsening symptoms such as fever, worsening cough, shortness of breath, chest tightness, chest pain, turning blue, changes in mental status.  Reviewed expectations re: course of current medical issues. Questions answered. Outlined signs and symptoms indicating need for more acute intervention. Patient verbalized understanding. After Visit Summary given.         Moshe Cipro, NP 09/29/20 1328

## 2020-09-30 LAB — COVID-19, FLU A+B NAA
Influenza A, NAA: NOT DETECTED
Influenza B, NAA: NOT DETECTED
SARS-CoV-2, NAA: NOT DETECTED

## 2020-10-02 ENCOUNTER — Emergency Department (HOSPITAL_COMMUNITY)
Admission: EM | Admit: 2020-10-02 | Discharge: 2020-10-02 | Disposition: A | Discharge status: Home / Self Care | Payer: Self-pay | Attending: Emergency Medicine | Admitting: Emergency Medicine

## 2020-10-02 ENCOUNTER — Other Ambulatory Visit: Payer: Self-pay

## 2020-10-02 ENCOUNTER — Encounter (HOSPITAL_COMMUNITY): Payer: Self-pay | Admitting: Emergency Medicine

## 2020-10-02 DIAGNOSIS — Z87891 Personal history of nicotine dependence: Secondary | ICD-10-CM | POA: Insufficient documentation

## 2020-10-02 DIAGNOSIS — I10 Essential (primary) hypertension: Secondary | ICD-10-CM | POA: Insufficient documentation

## 2020-10-02 DIAGNOSIS — Z79899 Other long term (current) drug therapy: Secondary | ICD-10-CM | POA: Insufficient documentation

## 2020-10-02 DIAGNOSIS — S01511A Laceration without foreign body of lip, initial encounter: Secondary | ICD-10-CM | POA: Insufficient documentation

## 2020-10-02 MED ORDER — CHLORHEXIDINE GLUCONATE 0.12 % MT SOLN
15.0000 mL | Freq: Two times a day (BID) | OROMUCOSAL | 0 refills | Status: DC
Start: 2020-10-02 — End: 2023-08-13
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
## 2020-10-02 NOTE — Discharge Instructions (Signed)
Lacerations on the inside of her lip typically heal very quickly on their own.  Use Peridex mouthwash twice daily to help reduce any potential infection.  You can use syringe to help rinse out area to ensure food is not stuck in the cut.  You can apply ice and take ibuprofen and Tylenol to help with pain and swelling.  If areas not improving return to the ED or see your PCP.  If you develop worsening swelling, pain or purulent drainage return as these are signs of infection.
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
## 2020-10-09 IMAGING — DX DG CHEST 1V PORT
1 series · 1 of 1 positions shown · non-contrast
Comparison: 10/31/2018

CLINICAL DATA: Chest pain and chills 3 days.

EXAM:
PORTABLE CHEST 1 VIEW

[chest ap]
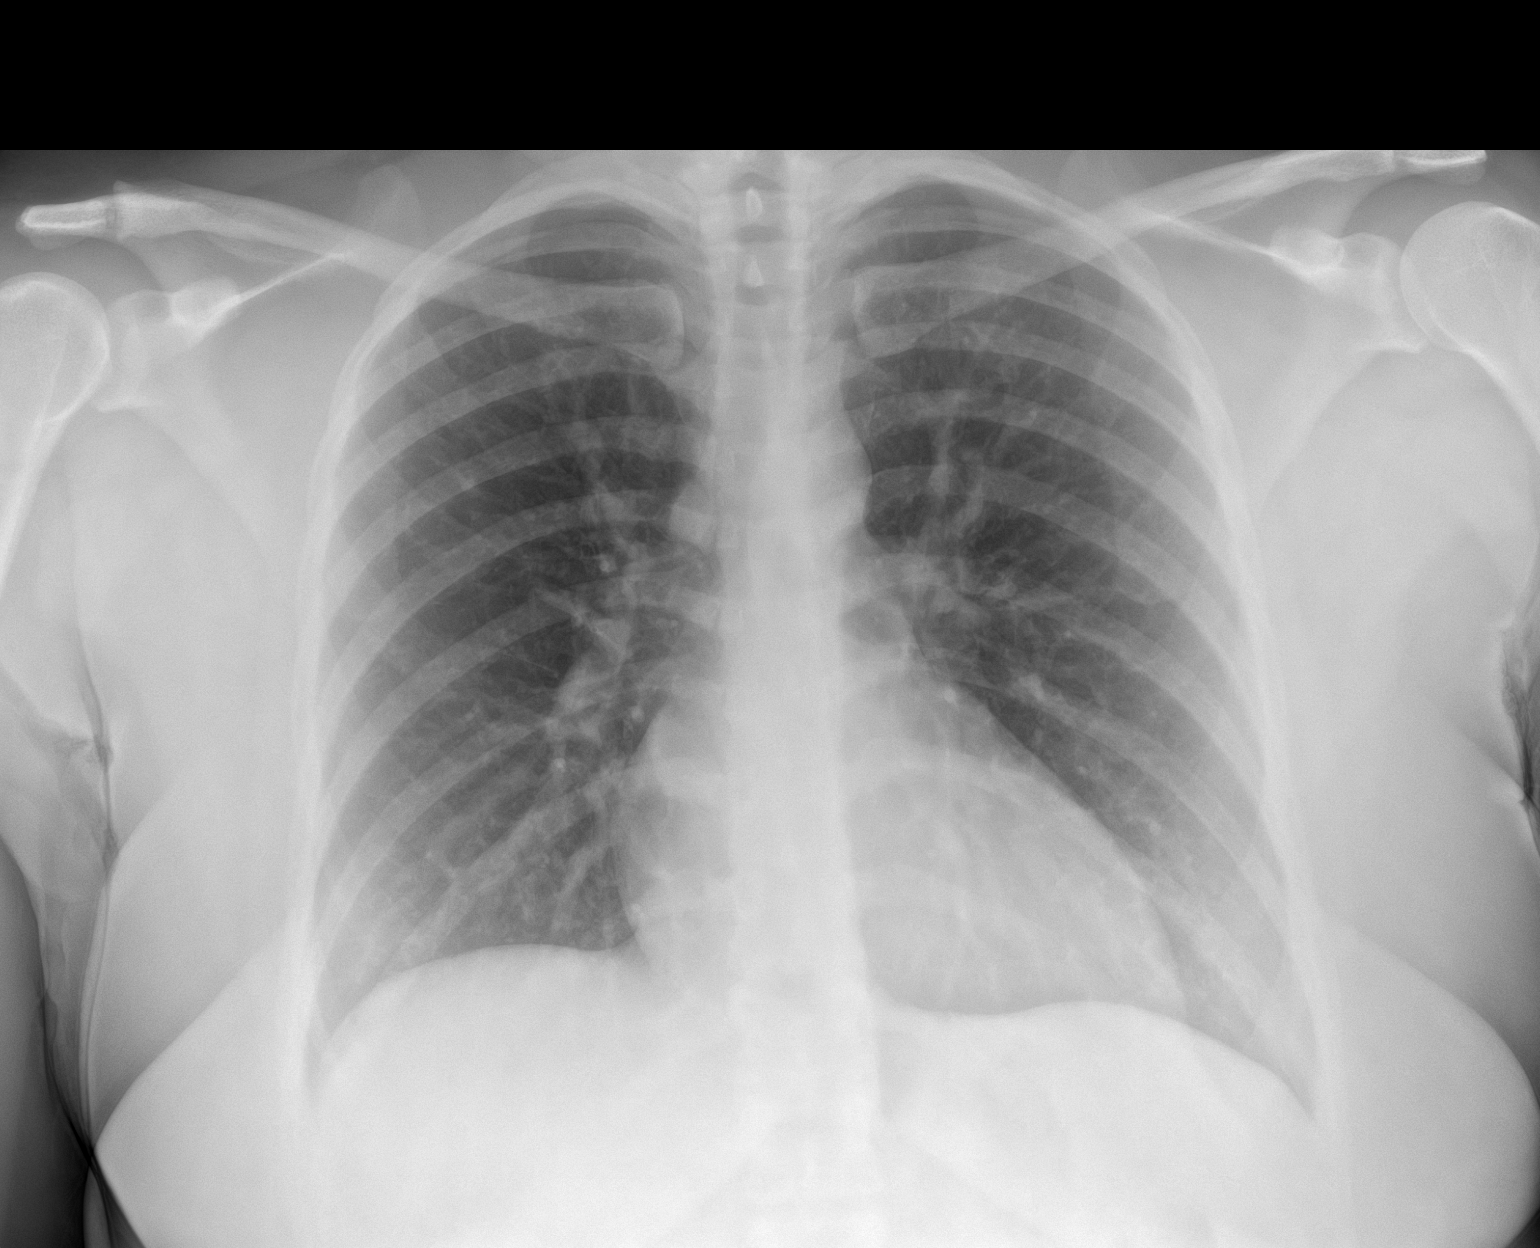

[1 of 1 positions shown; findings below may reference images not displayed]

FINDINGS: The heart size and mediastinal contours are within normal limits.
Both lungs are clear. The visualized skeletal structures are
unremarkable.
IMPRESSION: No active disease.

## 2020-10-17 IMAGING — DX DG LUMBAR SPINE COMPLETE 4+V
5 series · 5 of 5 positions shown · non-contrast
Comparison: 01/21/2008

CLINICAL DATA: Low back pain and left leg pain

EXAM:
LUMBAR SPINE - COMPLETE 4+ VIEW

[l-spine ap]
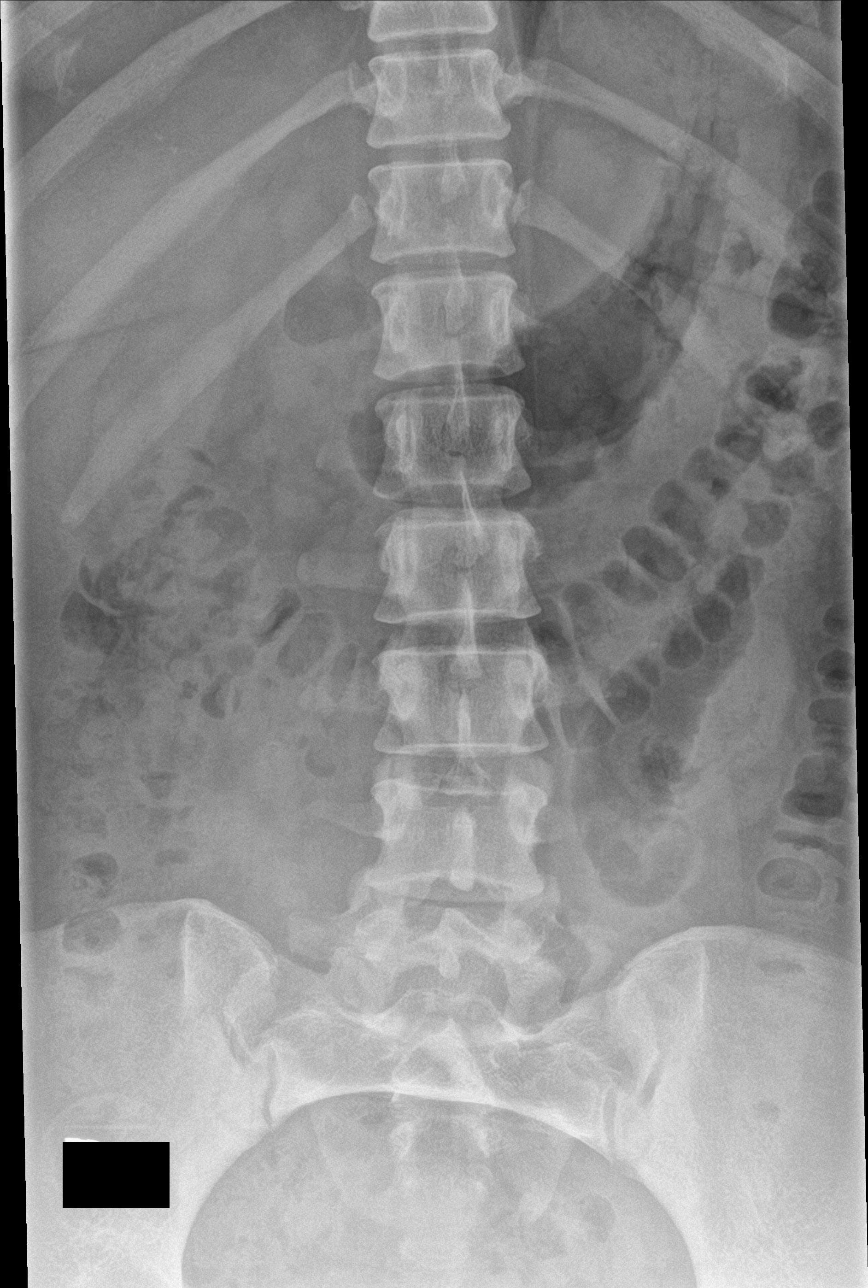

[l-spine obl (1 of 2)]
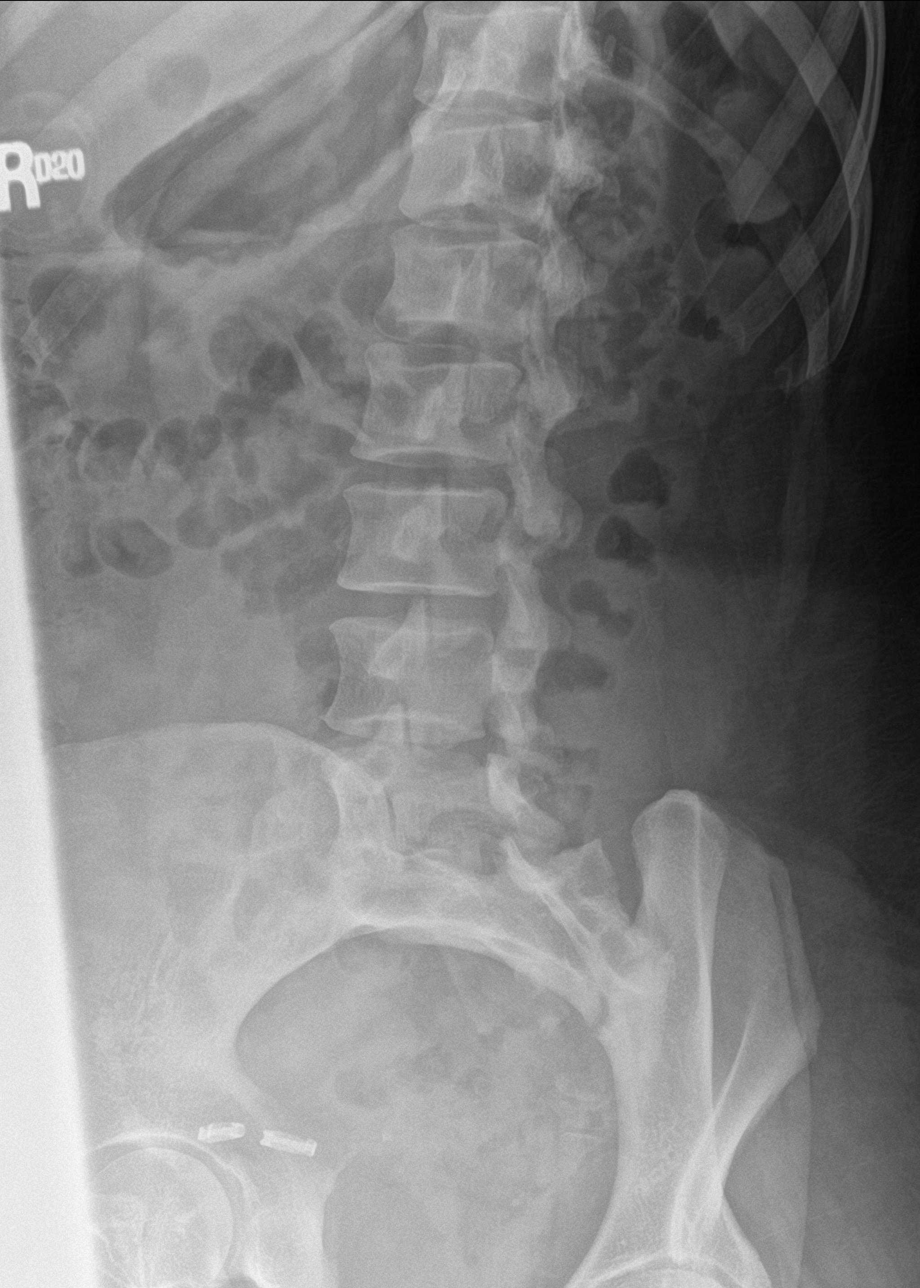

[l-spine obl (2 of 2)]
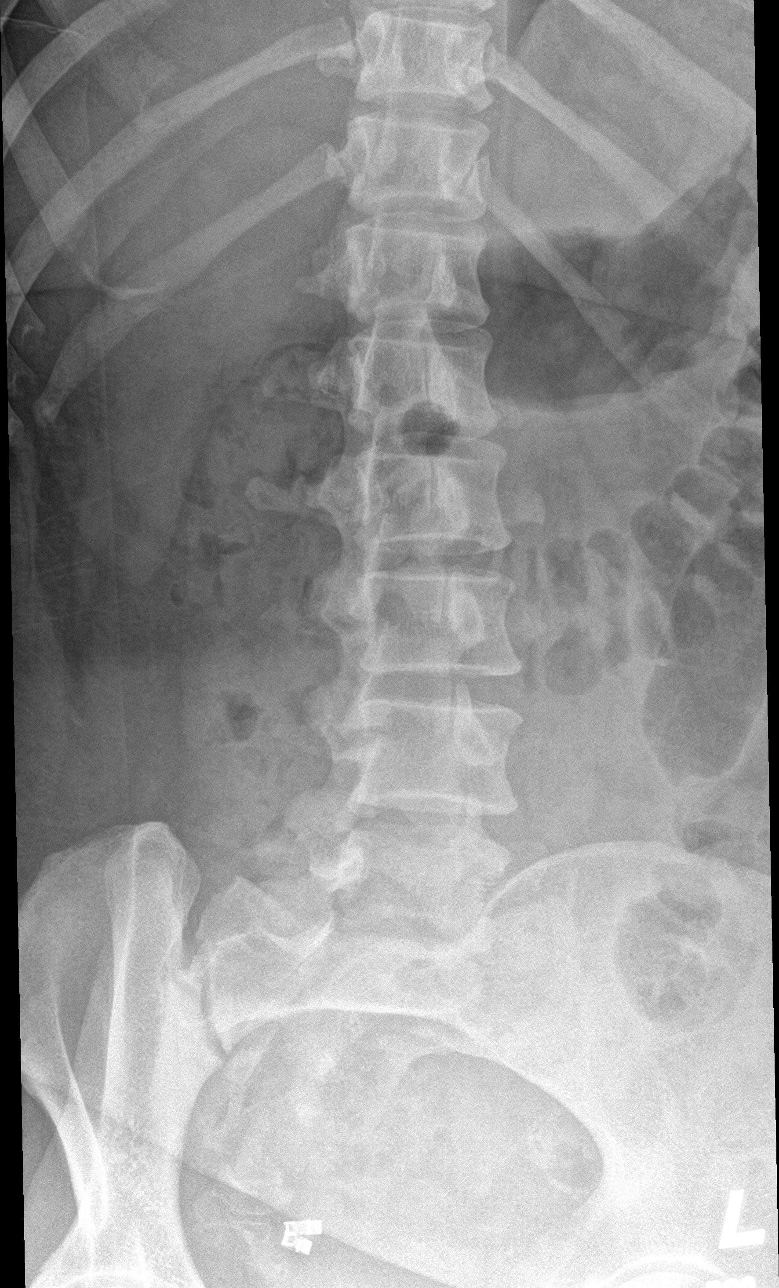

[l-spine lat]
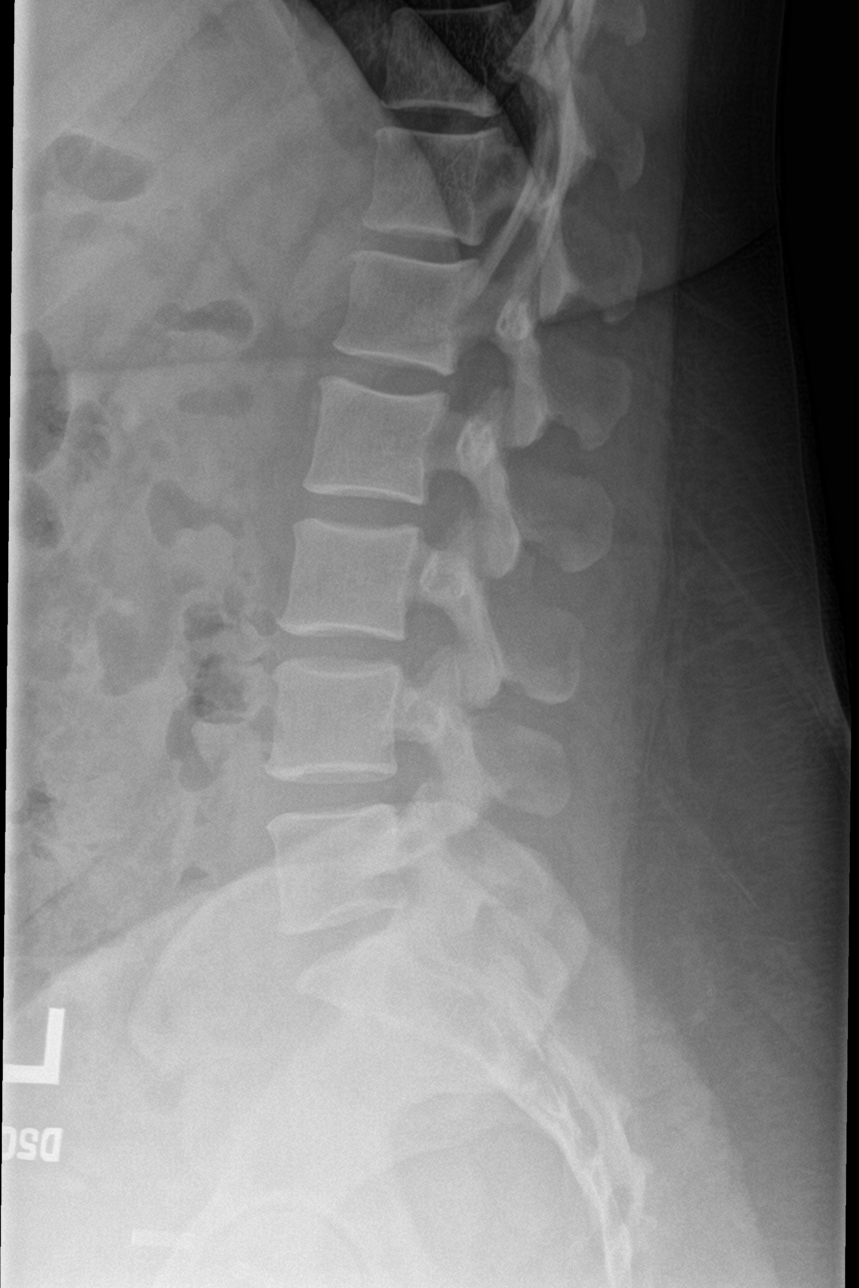

[l-spine spot]
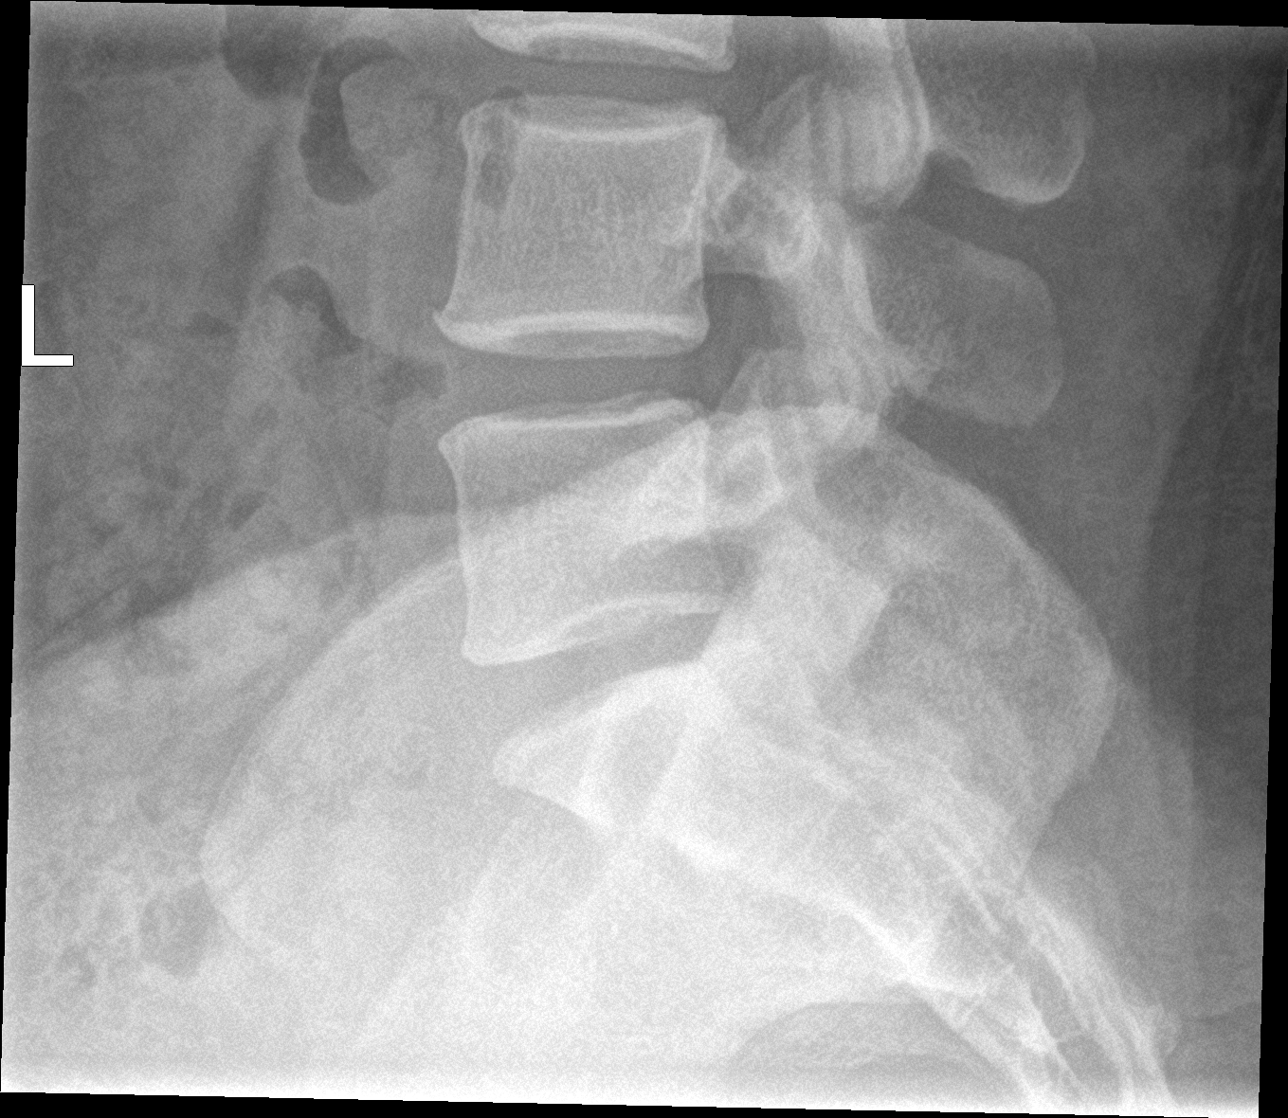

[5 of 5 positions shown; findings below may reference images not displayed]

FINDINGS: There are 6 non rib-bearing lumbar type vertebral segments. There is
no evidence of lumbar spine fracture. Alignment is normal.
Intervertebral disc spaces are maintained. Facet joints appear
normal without degenerative findings. Unremarkable SI joints.
IMPRESSION: No acute findings or significant degenerative changes of the lumbar
spine.

## 2021-03-07 ENCOUNTER — Emergency Department (HOSPITAL_COMMUNITY)
Admission: EM | Admit: 2021-03-07 | Discharge: 2021-03-07 | Disposition: A | Discharge status: Home / Self Care | Payer: Self-pay | Attending: Emergency Medicine | Admitting: Emergency Medicine

## 2021-03-07 DIAGNOSIS — M79604 Pain in right leg: Secondary | ICD-10-CM | POA: Insufficient documentation

## 2021-03-07 DIAGNOSIS — Z5321 Procedure and treatment not carried out due to patient leaving prior to being seen by health care provider: Secondary | ICD-10-CM | POA: Insufficient documentation

## 2021-03-07 DIAGNOSIS — W109XXA Fall (on) (from) unspecified stairs and steps, initial encounter: Secondary | ICD-10-CM | POA: Insufficient documentation

## 2021-03-07 DIAGNOSIS — R42 Dizziness and giddiness: Secondary | ICD-10-CM | POA: Insufficient documentation

## 2021-03-07 DIAGNOSIS — M79605 Pain in left leg: Secondary | ICD-10-CM | POA: Insufficient documentation

## 2021-03-07 NOTE — ED Triage Notes (Signed)
Pt reports falling down stairs Saturday night. Pt reporting hitting the back of her head and states that she has intermittent dizziness since the fall. Pt also reports bilateral leg pain. Pt alert and oriented x 4 at this time and moving all extremities. Denies weakness.

## 2022-08-11 ENCOUNTER — Ambulatory Visit
Admission: EM | Admit: 2022-08-11 | Discharge: 2022-08-11 | Disposition: A | Discharge status: Home / Self Care | Payer: Self-pay

## 2022-08-11 NOTE — ED Notes (Signed)
Options given to pt for TB test to be read. Pt agreed.

## 2022-10-30 ENCOUNTER — Emergency Department (HOSPITAL_COMMUNITY): Payer: Self-pay

## 2022-10-30 ENCOUNTER — Emergency Department (HOSPITAL_COMMUNITY)
Admission: EM | Admit: 2022-10-30 | Discharge: 2022-10-30 | Disposition: A | Discharge status: Home / Self Care | Payer: Self-pay | Attending: Emergency Medicine | Admitting: Emergency Medicine

## 2022-10-30 ENCOUNTER — Other Ambulatory Visit: Payer: Self-pay

## 2022-10-30 ENCOUNTER — Encounter (HOSPITAL_COMMUNITY): Payer: Self-pay | Admitting: Emergency Medicine

## 2022-10-30 DIAGNOSIS — I10 Essential (primary) hypertension: Secondary | ICD-10-CM | POA: Insufficient documentation

## 2022-10-30 DIAGNOSIS — R0789 Other chest pain: Secondary | ICD-10-CM | POA: Insufficient documentation

## 2022-10-30 LAB — CBC WITH DIFFERENTIAL/PLATELET
Abs Immature Granulocytes: 0.02 10*3/uL (ref 0.00–0.07)
Basophils Absolute: 0 10*3/uL (ref 0.0–0.1)
Basophils Relative: 1 %
Eosinophils Absolute: 0.2 10*3/uL (ref 0.0–0.5)
Eosinophils Relative: 3 %
HCT: 38.1 % (ref 36.0–46.0)
Hemoglobin: 13.1 g/dL (ref 12.0–15.0)
Immature Granulocytes: 0 %
Lymphocytes Relative: 37 %
Lymphs Abs: 2.7 10*3/uL (ref 0.7–4.0)
MCH: 30.5 pg (ref 26.0–34.0)
MCHC: 34.4 g/dL (ref 30.0–36.0)
MCV: 88.6 fL (ref 80.0–100.0)
Monocytes Absolute: 0.6 10*3/uL (ref 0.1–1.0)
Monocytes Relative: 8 %
Neutro Abs: 3.8 10*3/uL (ref 1.7–7.7)
Neutrophils Relative %: 51 %
Platelets: 418 10*3/uL — ABNORMAL HIGH (ref 150–400)
RBC: 4.3 MIL/uL (ref 3.87–5.11)
RDW: 13.7 % (ref 11.5–15.5)
WBC: 7.3 10*3/uL (ref 4.0–10.5)
nRBC: 0 % (ref 0.0–0.2)

## 2022-10-30 LAB — COMPREHENSIVE METABOLIC PANEL
ALT: 31 U/L (ref 0–44)
AST: 24 U/L (ref 15–41)
Albumin: 3.4 g/dL — ABNORMAL LOW (ref 3.5–5.0)
Alkaline Phosphatase: 61 U/L (ref 38–126)
Anion gap: 9 (ref 5–15)
BUN: 12 mg/dL (ref 6–20)
CO2: 23 mmol/L (ref 22–32)
Calcium: 8.7 mg/dL — ABNORMAL LOW (ref 8.9–10.3)
Chloride: 105 mmol/L (ref 98–111)
Creatinine, Ser: 0.64 mg/dL (ref 0.44–1.00)
GFR, Estimated: >60 mL/min (ref >60)
Glucose, Bld: 99 mg/dL (ref 70–99)
Potassium: 3.5 mmol/L (ref 3.5–5.1)
Sodium: 137 mmol/L (ref 135–145)
Total Bilirubin: 0.2 mg/dL — ABNORMAL LOW (ref 0.3–1.2)
Total Protein: 6.7 g/dL (ref 6.5–8.1)

## 2022-10-30 LAB — D-DIMER, QUANTITATIVE: D-Dimer, Quant: 0.3 ug/mL-FEU (ref 0.00–0.50)

## 2022-10-30 LAB — TROPONIN I (HIGH SENSITIVITY)
Troponin I (High Sensitivity): 3 ng/L (ref <18)
Troponin I (High Sensitivity): 4 ng/L (ref <18)

## 2022-10-30 MED ORDER — MORPHINE SULFATE (PF) 2 MG/ML IV SOLN
2.0000 mg | Freq: Once | INTRAVENOUS | Status: AC
  Administered 2022-10-30: 2 mg via INTRAVENOUS
  Filled 2022-10-30: qty 1

## 2022-10-30 MED ORDER — NITROGLYCERIN 0.4 MG SL SUBL
0.4000 mg | SUBLINGUAL_TABLET | Freq: Once | SUBLINGUAL | Status: AC
  Administered 2022-10-30: 0.4 mg via SUBLINGUAL
  Filled 2022-10-30: qty 1

## 2022-10-30 MED ORDER — SODIUM CHLORIDE 0.9 % IV BOLUS
500.0000 mL | Freq: Once | INTRAVENOUS | Status: AC
  Administered 2022-10-30: 500 mL via INTRAVENOUS

## 2022-10-30 MED ORDER — LORAZEPAM 2 MG/ML IJ SOLN
0.5000 mg | Freq: Once | INTRAMUSCULAR | Status: AC
  Administered 2022-10-30: 0.5 mg via INTRAVENOUS
  Filled 2022-10-30: qty 1

## 2022-10-30 MED ORDER — OXYCODONE-ACETAMINOPHEN 5-325 MG PO TABS: 1.0000 | ORAL_TABLET | Freq: Four times a day (QID) | ORAL | 0 refills | Status: DC | PRN

## 2022-10-30 MED ORDER — HYDROMORPHONE HCL 1 MG/ML IJ SOLN
0.5000 mg | Freq: Once | INTRAMUSCULAR | Status: AC
  Administered 2022-10-30: 0.5 mg via INTRAVENOUS
  Filled 2022-10-30: qty 0.5

## 2022-10-30 NOTE — ED Triage Notes (Signed)
Pt c/o chest pain that has been going on since 11 last night and taken medication for indigestion without relied. C/o chest tightness and pressure radiates to L shoulder rates pain 10/10

## 2022-10-30 NOTE — Discharge Instructions (Signed)
Follow-up with your family doctor this week for recheck.  Return if problems

## 2022-10-30 NOTE — ED Notes (Signed)
Pt states she does take medication for high BP; However, she did not take any today

## 2022-10-30 NOTE — ED Notes (Addendum)
This RN asked pt if she thought the sublingual nitroglycerin helped in relieving chest pressure/tightness. Pt states "maybe a little, but it still hurts so bad like a bag of rocks is sitting on my L shoulder" still c/o chest pressure and tightness

## 2022-10-30 NOTE — ED Provider Notes (Signed)
[place] Provider Note   CSN: [id number] Arrival date & time: 10/30/22  2993     History {Add pertinent medical, surgical, social history, OB history to HPI:1} Chief Complaint  Patient presents with   Chest Pain    Laura Lopez is a 37 y.o. female.  Patient has a history of hypertension.  She was complaining of left-sided chest pain.   Chest Pain      Home Medications Prior to Admission medications   Medication Sig Start Date End Date Taking? Authorizing Provider  oxyCODONE-acetaminophen (PERCOCET/ROXICET) 5-325 MG tablet Take 1 tablet by mouth every 6 (six) hours as needed for severe pain. 10/30/22  Yes Milton Ferguson, MD  chlorhexidine (PERIDEX) 0.12 % solution Use as directed 15 mLs in the mouth or throat 2 (two) times daily. Patient not taking: Reported on 10/30/2022 10/02/20   Jacqlyn Larsen, PA-C  metroNIDAZOLE (FLAGYL) 500 MG tablet Take 1 tablet (500 mg total) by mouth 2 (two) times daily. Patient not taking: Reported on 10/30/2022 05/03/20   Stacey Drain [place], PA-C  promethazine-dextromethorphan (PROMETHAZINE-DM) 6.25-15 MG/5ML syrup Take 5 mLs by mouth 4 (four) times daily as needed for cough. Patient not taking: Reported on 10/30/2022 09/29/20   Faustino Congress, NP  amLODipine (NORVASC) 5 MG tablet Take 1 tablet (5 mg total) by mouth daily. Patient not taking: Reported on 10/29/2019 10/31/18 05/03/20  Kem Parkinson, PA-C  promethazine (PHENERGAN) 12.5 MG tablet Take 1 tablet (12.5 mg total) by mouth every 6 (six) hours as needed. Patient not taking: Reported on 10/29/2019 11/05/18 05/03/20  Lily Kocher, PA-C      Allergies    Patient has no known allergies.    Review of Systems   Review of Systems  Cardiovascular:  Positive for chest pain.    Physical Exam Updated Vital Signs BP (!) 157/120   Pulse 87   Temp 98 F (36.7 C) (Oral)   Resp 20   Ht 5\' 6"  (1.676 m)   Wt 99.8 kg   SpO2 100%   BMI 35.51 kg/m   Physical Exam  ED Results / Procedures / Treatments   Labs (all labs ordered are listed, but only abnormal results are displayed) Labs Reviewed  CBC WITH DIFFERENTIAL/PLATELET - Abnormal; Notable for the following components:      Result Value   Platelets 418 (*)    All other components within normal limits  COMPREHENSIVE METABOLIC PANEL - Abnormal; Notable for the following components:   Calcium 8.7 (*)    Albumin 3.4 (*)    Total Bilirubin 0.2 (*)    All other components within normal limits  D-DIMER, QUANTITATIVE  TROPONIN I (HIGH SENSITIVITY)  TROPONIN I (HIGH SENSITIVITY)    EKG EKG Interpretation  Date/Time:  Monday October 30 2022 10:35:37 EST Ventricular Rate:  98 PR Interval:  146 QRS Duration: 89 QT Interval:  364 QTC Calculation: 465 R Axis:   26 Text Interpretation: Sinus rhythm Confirmed by Milton Ferguson [phone]) on 10/30/2022 3:07:14 PM  Radiology DG Chest Port 1 View  Result Date: 10/30/2022 CLINICAL DATA:  Chest pain since 11 p.m. last night. Chest tightness and pressure radiating to left arm. EXAM: PORTABLE CHEST 1 VIEW COMPARISON:  AP chest 10/29/2019, chest two views 10/31/2018 FINDINGS: Cardiac silhouette and mediastinal contours are within normal limits. The lungs are clear. No pleural effusion or pneumothorax. No acute skeletal abnormality. IMPRESSION: No active disease. Electronically Signed   By: Yvonne Kendall M.D.   On:  10/30/2022 08:15    Procedures Procedures  {Document cardiac monitor, telemetry assessment procedure when appropriate:1}  Medications Ordered in ED Medications  nitroGLYCERIN (NITROSTAT) SL tablet 0.4 mg (0.4 mg Sublingual Given 10/30/22 0833)  morphine (PF) 2 MG/ML injection 2 mg (2 mg Intravenous Given 10/30/22 0832)  sodium chloride 0.9 % bolus 500 mL (0 mLs Intravenous Stopped 10/30/22 1442)  HYDROmorphone (DILAUDID) injection 0.5 mg (0.5 mg Intravenous Given 10/30/22 1059)  LORazepam (ATIVAN) injection 0.5 mg (0.5 mg  Intravenous Given 10/30/22 1059)    ED Course/ Medical Decision Making/ A&P   {Patient with atypical left-sided chest pain.  Troponins and D-dimer normal.  I suggest getting a CT angio but the patient wanted to be discharged home. Click here for ABCD2, HEART and other calculatorsREFRESH Note before signing :1}                          Medical Decision Making Amount and/or Complexity of Data Reviewed Labs: ordered. Radiology: ordered.  Risk Prescription drug management.   Patient with atypical chest pain and normal troponins and D-dimer.  She is given some pain medicine and will follow-up with her PCP  {Document critical care time when appropriate:1} {Document review of labs and clinical decision tools ie heart score, Chads2Vasc2 etc:1}  {Document your independent review of radiology images, and any outside records:1} {Document your discussion with family members, caretakers, and with consultants:1} {Document social determinants of health affecting pt's care:1} {Document your decision making why or why not admission, treatments were needed:1} Final Clinical Impression(s) / ED Diagnoses Final diagnoses:  Atypical chest pain    Rx / DC Orders ED Discharge Orders          Ordered    oxyCODONE-acetaminophen (PERCOCET/ROXICET) 5-325 MG tablet  Every 6 hours PRN        10/30/22 1513

## 2022-11-10 ENCOUNTER — Telehealth: Payer: Self-pay

## 2022-11-10 NOTE — Telephone Encounter (Signed)
Attempted call to follow up on a dual enrolled [facility] client from [facility].  No answer, left message requesting return call to notify cline of all services offered through [facility] and to follow up regarding ER visit from 10/30/22.  Next appointment at [facility] scheduled for 11/20/22  Plan: Left voicemail requesting return call Will refer to Social Work interns for further follow up and needs assessment.   [employer]

## 2023-04-17 NOTE — Congregational Nurse Program (Signed)
Wellness check in was completed with pt on today while  after shopping at our onsite food market at [employer].  Spoke with enrollment team Louanna Raw M) and she stated that patient has been approved for Medicaid as 04/16/2023   Pt expressed the following  need of various [facility] of needs areas.    1. States she is currently connected with her PCP at       the [facility] Dept with her last appt        being 11/2022  2.  States she is currently out of her BP meds and typically          Gets from the [facility]  3.  States she has not been able to get to       transportation to her appts and is the reason why she       has not been to pick up her meds  4.  States she wants to get reconnected with behavioral      health so that she can get back on her anxiety and      depression medications.  States she was once       connected with [facility] for her behavioral      health needs  PLAN -contact pt back to provide her Medicaid information   since being approved on 7.15.24 -assist pt with Medicaid Transition of Care as it relates to a Medicaid PCP -assist pt with making a follow-up appt to get her BP    medication refill -assist her with setting up Medicaid transportation to help her  get to medical appointments -Assist her with getting re-connected with behavioral health to assist with getting back on her meds r/t to anxiety and depression dx she states she had in the past    Pt stated she understood the plan and will look forward

## 2023-04-18 NOTE — Congregational Nurse Program (Signed)
Pt attended [facility] on today and followed up with me as requested  regarding obtaining assistance with  her transition of care to [facility] health related services now that she has been approved.     Plan -Reviewed steps of transition of care to Medicaid with the patient to ensure no gaps to occur with medical, pharmacy, behavorial health by supplying contact information that will assist her in the near   -Assisted pt with deciding on a new PCP, in which she chose to establish new care at [facility], as her new Medicaid health care provider.  -Assisted pt with initial call to setup new patient appointment  at [facility].  Her appointment was scheduled for May 23, 2023 at 10:00am -Assisted pt with setting up transportation to be able to get to her appts -Assisted pt with locating new pharmacy to ger he medications transferred, in which her choice is [facility] due to delivery service option and her limited transportation to pick up medications  Sociodeterminant of health needs addressed Food (Pt shopped for food on today prior to completing her visit) Transportation - pt downloaded app to setup up future medical appts   Pt was grateful for the services she has received with [facility] and then left clinic

## 2023-05-23 ENCOUNTER — Ambulatory Visit: Payer: Medicaid Other | Admitting: Family Medicine

## 2023-05-25 ENCOUNTER — Encounter: Payer: Self-pay | Admitting: Family Medicine

## 2023-06-19 ENCOUNTER — Ambulatory Visit
Admission: EM | Admit: 2023-06-19 | Discharge: 2023-06-19 | Disposition: A | Discharge status: Home / Self Care | Payer: No Typology Code available for payment source | Attending: Family Medicine | Admitting: Family Medicine

## 2023-06-19 ENCOUNTER — Other Ambulatory Visit: Payer: Self-pay

## 2023-06-19 ENCOUNTER — Encounter: Payer: Self-pay | Admitting: Emergency Medicine

## 2023-06-19 DIAGNOSIS — Z1152 Encounter for screening for COVID-19: Secondary | ICD-10-CM | POA: Diagnosis not present

## 2023-06-19 DIAGNOSIS — J069 Acute upper respiratory infection, unspecified: Secondary | ICD-10-CM | POA: Insufficient documentation

## 2023-06-19 LAB — POCT INFLUENZA A/B
Influenza A, POC: NEGATIVE
Influenza B, POC: NEGATIVE

## 2023-06-19 MED ORDER — PROMETHAZINE-DM 6.25-15 MG/5ML PO SYRP: 5.0000 mL | ORAL_SOLUTION | Freq: Four times a day (QID) | ORAL | 0 refills | Status: DC | PRN

## 2023-06-19 NOTE — ED Provider Notes (Signed)
[facility]    CSN: [id number] Arrival date & time: 06/19/23  1143      History   Chief Complaint Chief Complaint  Patient presents with   Generalized Body Aches    HPI Laura Lopez is a 37 y.o. female.   Patient presenting today with 1 day history of fever, chills, body aches, headache, congestion, cough.  Denies chest pain, shortness of breath, abdominal pain, nausea vomiting or diarrhea.  So far trying TheraFlu with minimal relief.  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     Past Medical History:  Diagnosis Date   Anxiety    Chronic back pain    Depression    GERD (gastroesophageal reflux disease)    Hypertension     Patient Active Problem List   Diagnosis Date Noted   Major depressive disorder, recurrent episode, moderate (HCC) 01/31/2013    Class: Acute    Past Surgical History:  Procedure Laterality Date   TONSILLECTOMY     TUBAL LIGATION     TUBAL LIGATION      OB History     Gravida  4   Para  3   Term  3   Preterm      AB  1   Living         SAB      IAB  1   Ectopic      Multiple      Live Births               Home Medications    Prior to Admission medications   Medication Sig Start Date End Date Taking? Authorizing Provider  amLODipine (NORVASC) 5 MG tablet Take 5 mg by mouth daily.   Yes [provider]  pantoprazole (PROTONIX) 40 MG tablet Take 40 mg by mouth daily.   Yes [provider]  promethazine-dextromethorphan (PROMETHAZINE-DM) 6.25-15 MG/5ML syrup Take 5 mLs by mouth 4 (four) times daily as needed. 06/19/23  Yes Particia Nearing, PA-C  chlorhexidine (PERIDEX) 0.12 % solution Use as directed 15 mLs in the mouth or throat 2 (two) times daily. Patient not taking: Reported on 10/30/2022 10/02/20   Dartha Lodge, PA-C  metroNIDAZOLE (FLAGYL) 500 MG tablet Take 1 tablet (500 mg total) by mouth 2 (two) times daily. Patient not taking: Reported on 10/30/2022 05/03/20    Alvino Chapel [place], PA-C  oxyCODONE-acetaminophen (PERCOCET/ROXICET) 5-325 MG tablet Take 1 tablet by mouth every 6 (six) hours as needed for severe pain. 10/30/22   Bethann Berkshire, MD  promethazine-dextromethorphan (PROMETHAZINE-DM) 6.25-15 MG/5ML syrup Take 5 mLs by mouth 4 (four) times daily as needed for cough. Patient not taking: Reported on 10/30/2022 09/29/20   Moshe Cipro, FNP  promethazine (PHENERGAN) 12.5 MG tablet Take 1 tablet (12.5 mg total) by mouth every 6 (six) hours as needed. Patient not taking: Reported on 10/29/2019 11/05/18 05/03/20  Ivery Quale, PA-C    Family History History reviewed. No pertinent family history.  Social History Social History   Tobacco Use   Smoking status: Former    Types: Cigarettes   Smokeless tobacco: Never  Vaping Use   Vaping status: Every Day   Substances: Nicotine, Flavoring  Substance Use Topics   Alcohol use: Yes    Alcohol/week: 0.0 standard drinks of alcohol    Comment: occ   Drug use: No     Allergies   Patient has no known allergies.   Review of Systems Review of Systems Per  HPI  Physical Exam Triage Vital Signs ED Triage Vitals [06/19/23 1317]  Encounter Vitals Group     BP (!) 157/95     Systolic BP Percentile      Diastolic BP Percentile      Pulse Rate 77     Resp 20     Temp 98.5 F (36.9 C)     Temp Source Oral     SpO2 98 %     Weight      Height      Head Circumference      Peak Flow      Pain Score 9     Pain Loc      Pain Education      Exclude from Growth Chart    No data found.  Updated Vital Signs BP (!) 157/95 (BP Location: Right Arm)   Pulse 77   Temp 98.5 F (36.9 C) (Oral)   Resp 20   LMP 06/10/2023 (Approximate)   SpO2 98%   Visual Acuity Right Eye Distance:   Left Eye Distance:   Bilateral Distance:    Right Eye Near:   Left Eye Near:    Bilateral Near:     Physical Exam Vitals and nursing note reviewed.  Constitutional:      Appearance: Normal appearance.   HENT:     Head: Atraumatic.     Right Ear: Tympanic membrane and external ear normal.     Left Ear: Tympanic membrane and external ear normal.     Nose: Rhinorrhea present.     Mouth/Throat:     Mouth: Mucous membranes are moist.     Pharynx: Posterior oropharyngeal erythema present.  Eyes:     Extraocular Movements: Extraocular movements intact.     Conjunctiva/sclera: Conjunctivae normal.  Cardiovascular:     Rate and Rhythm: Normal rate and regular rhythm.     Heart sounds: Normal heart sounds.  Pulmonary:     Effort: Pulmonary effort is normal.     Breath sounds: Normal breath sounds. No wheezing or rales.  Musculoskeletal:        General: Normal range of motion.     Cervical back: Normal range of motion and neck supple.  Skin:    General: Skin is warm and dry.  Neurological:     Mental Status: She is alert and oriented to person, place, and time.  Psychiatric:        Mood and Affect: Mood normal.        Thought Content: Thought content normal.      UC Treatments / Results  Labs (all labs ordered are listed, but only abnormal results are displayed) Labs Reviewed  SARS CORONAVIRUS 2 (TAT 6-24 HRS)  POCT INFLUENZA A/B    EKG   Radiology No results found.  Procedures Procedures (including critical care time)  Medications Ordered in UC Medications - No data to display  Initial Impression / Assessment and Plan / UC Course  I have reviewed the triage vital signs and the nursing notes.  Pertinent labs & imaging results that were available during my care of the patient were reviewed by me and considered in my medical decision making (see chart for details).     Vitals and exam overall reassuring today, suspect viral etiology.  Rapid flu negative, COVID testing pending.  Treat with Phenergan DM, supportive over-the-counter medications and home care.  Work note given.  COVID testing pending, good candidate for Paxlovid if positive.  Final Clinical  Impressions(s) /  UC Diagnoses   Final diagnoses:  Viral URI with cough   Discharge Instructions   None    ED Prescriptions     Medication Sig Dispense Auth. Provider   promethazine-dextromethorphan (PROMETHAZINE-DM) 6.25-15 MG/5ML syrup Take 5 mLs by mouth 4 (four) times daily as needed. 100 mL Particia Nearing, [place]      PDMP not reviewed this encounter.   Particia Nearing, [place] 06/19/23 1450

## 2023-06-19 NOTE — ED Triage Notes (Signed)
Pt reports body aches, chills, headache, nasal congestion since last night.

## 2023-06-25 ENCOUNTER — Telehealth: Payer: Self-pay

## 2023-06-25 NOTE — Telephone Encounter (Signed)
Pt contacted Laura Lopez for community resource for furniture needs, due to  her need for a [employer] -Provided with 3 resources:   [employer] [place]; [id number] and [employer] was grateful for resources and was advised to call back if needed and call then ended

## 2023-08-13 ENCOUNTER — Encounter: Payer: Self-pay | Admitting: Obstetrics & Gynecology

## 2023-08-13 ENCOUNTER — Ambulatory Visit (INDEPENDENT_AMBULATORY_CARE_PROVIDER_SITE_OTHER): Payer: No Typology Code available for payment source | Admitting: Obstetrics & Gynecology

## 2023-08-13 VITALS — BP 158/98 | HR 97 | Ht 66.0 in | Wt 211.0 lb

## 2023-08-13 DIAGNOSIS — F418 Other specified anxiety disorders: Secondary | ICD-10-CM | POA: Diagnosis not present

## 2023-08-13 DIAGNOSIS — N920 Excessive and frequent menstruation with regular cycle: Secondary | ICD-10-CM

## 2023-08-13 DIAGNOSIS — N939 Abnormal uterine and vaginal bleeding, unspecified: Secondary | ICD-10-CM | POA: Diagnosis not present

## 2023-08-13 DIAGNOSIS — N946 Dysmenorrhea, unspecified: Secondary | ICD-10-CM

## 2023-08-13 LAB — POCT HEMOGLOBIN: Hemoglobin: 14.7 g/dL — AB (ref 11–14.6)

## 2023-08-13 MED ORDER — KETOROLAC TROMETHAMINE 10 MG PO TABS: 10.0000 mg | ORAL_TABLET | Freq: Three times a day (TID) | ORAL | 0 refills | Status: AC | PRN

## 2023-08-13 MED ORDER — AMLODIPINE BESYLATE 5 MG PO TABS: 5.0000 mg | ORAL_TABLET | Freq: Every day | ORAL | 11 refills | Status: DC

## 2023-08-13 MED ORDER — PAROXETINE HCL 20 MG PO TABS: 20.0000 mg | ORAL_TABLET | Freq: Every day | ORAL | 1 refills | Status: AC

## 2023-08-13 NOTE — Progress Notes (Signed)
Chief Complaint  Patient presents with   leaking from belly button    Having heavy periods and cramping       37 y.o. [id number] Patient's last menstrual period was 07/30/2023. The current method of family planning is tubal ligation.  Outpatient Encounter Medications as of 08/13/2023  Medication Sig   amLODipine (NORVASC) 5 MG tablet Take 1 tablet (5 mg total) by mouth daily.   ketorolac (TORADOL) 10 MG tablet Take 1 tablet (10 mg total) by mouth every 8 (eight) hours as needed.   PARoxetine (PAXIL) 20 MG tablet Take 1 tablet (20 mg total) by mouth daily.   amLODipine (NORVASC) 5 MG tablet Take 5 mg by mouth daily. (Patient not taking: Reported on 08/13/2023)   pantoprazole (PROTONIX) 40 MG tablet Take 40 mg by mouth daily. (Patient not taking: Reported on 08/13/2023)   [DISCONTINUED] chlorhexidine (PERIDEX) 0.12 % solution Use as directed 15 mLs in the mouth or throat 2 (two) times daily. (Patient not taking: Reported on 10/30/2022)   [DISCONTINUED] metroNIDAZOLE (FLAGYL) 500 MG tablet Take 1 tablet (500 mg total) by mouth 2 (two) times daily. (Patient not taking: Reported on 10/30/2022)   [DISCONTINUED] oxyCODONE-acetaminophen (PERCOCET/ROXICET) 5-325 MG tablet Take 1 tablet by mouth every 6 (six) hours as needed for severe pain.   [DISCONTINUED] promethazine (PHENERGAN) 12.5 MG tablet Take 1 tablet (12.5 mg total) by mouth every 6 (six) hours as needed. (Patient not taking: Reported on 10/29/2019)   [DISCONTINUED] promethazine-dextromethorphan (PROMETHAZINE-DM) 6.25-15 MG/5ML syrup Take 5 mLs by mouth 4 (four) times daily as needed for cough. (Patient not taking: Reported on 10/30/2022)   [DISCONTINUED] promethazine-dextromethorphan (PROMETHAZINE-DM) 6.25-15 MG/5ML syrup Take 5 mLs by mouth 4 (four) times daily as needed.   No facility-administered encounter medications on file as of 08/13/2023.    Subjective Pt with horrible periods Heavy clots 7 days Very painful Wears  essentially depends Hemoglobin 14+ Getting worse over time Also malodorous draining from the umbilicus  Past Medical History:  Diagnosis Date   Anxiety    Chronic back pain    Depression    GERD (gastroesophageal reflux disease)    Hypertension     Past Surgical History:  Procedure Laterality Date   TONSILLECTOMY     TUBAL LIGATION     TUBAL LIGATION      OB History     Gravida  4   Para  3   Term  3   Preterm      AB  1   Living         SAB      IAB  1   Ectopic      Multiple      Live Births              No Known Allergies  Social History   Socioeconomic History   Marital status: Single    Spouse name: Not on file   Number of children: Not on file   Years of education: Not on file   Highest education level: Not on file  Occupational History   Not on file  Tobacco Use   Smoking status: Former    Types: Cigarettes   Smokeless tobacco: Never  Vaping Use   Vaping status: Former   Substances: Nicotine, Flavoring  Substance and Sexual Activity   Alcohol use: Yes    Alcohol/week: 0.0 standard drinks of alcohol    Comment: occ   Drug use: No   Sexual activity:  Yes    Birth control/protection: Surgical    Comment: tubal  Other Topics Concern   Not on file  Social History Narrative   Not on file   Social Determinants of Health   Financial Resource Strain: Low Risk  (08/13/2023)   Overall Financial Resource Strain (CARDIA)    Difficulty of Paying Living Expenses: Not very hard  Food Insecurity: No Food Insecurity (08/13/2023)   Hunger Vital Sign    Worried About Running Out of Food in the Last Year: Never true    Ran Out of Food in the Last Year: Never true  Transportation Needs: No Transportation Needs (08/13/2023)   PRAPARE - Administrator, Civil Service (Medical): No    Lack of Transportation (Non-Medical): No  Physical Activity: Inactive (08/13/2023)   Exercise Vital Sign    Days of Exercise per Week: 0 days     Minutes of Exercise per Session: 0 min  Stress: Stress Concern Present (08/13/2023)   [employer] of Occupational Health - Occupational Stress Questionnaire    Feeling of Stress : Very much  Social Connections: Socially Isolated (08/13/2023)   Social Connection and Isolation Panel [NHANES]    Frequency of Communication with Friends and Family: Twice a week    Frequency of Social Gatherings with Friends and Family: Never    Attends Religious Services: 1 to 4 times per year    Active Member of [employer] or Organizations: No    Attends Engineer, structural: Never    Marital Status: Never married    Family History  Problem Relation Age of Onset   COPD Maternal Grandmother    Emphysema Maternal Grandmother    Depression Maternal Grandmother    Cancer Mother        lung   COPD Mother    Fibroids Mother    Cirrhosis Mother     Medications:       Current Outpatient Medications:    amLODipine (NORVASC) 5 MG tablet, Take 1 tablet (5 mg total) by mouth daily., Disp: 30 tablet, Rfl: 11   ketorolac (TORADOL) 10 MG tablet, Take 1 tablet (10 mg total) by mouth every 8 (eight) hours as needed., Disp: 15 tablet, Rfl: 0   PARoxetine (PAXIL) 20 MG tablet, Take 1 tablet (20 mg total) by mouth daily., Disp: 30 tablet, Rfl: 1   amLODipine (NORVASC) 5 MG tablet, Take 5 mg by mouth daily. (Patient not taking: Reported on 08/13/2023), Disp: , Rfl:    pantoprazole (PROTONIX) 40 MG tablet, Take 40 mg by mouth daily. (Patient not taking: Reported on 08/13/2023), Disp: , Rfl:   Objective Blood pressure (!) 142/104, pulse 99, height 5\' 6"  (1.676 m), weight 211 lb (95.7 kg), last menstrual period 07/30/2023.  General WDWN female NAD Vulva:  normal appearing vulva with no masses, tenderness or lesions Vagina:  normal mucosa, no discharge Cervix:  Normal no lesions Uterus:  maybe  LITTLE GENEROUS IN SIZE NOT SIGNIFICANT, contour, position, consistency, mobility, non-tender Adnexa:  ovaries:present,  normal adnexa in size, nontender and no masses   Pertinent ROS No burning with urination, frequency or urgency No nausea, vomiting or diarrhea Nor fever chills or other constitutional symptoms   Labs or studies     Impression + Management Plan: Diagnoses this Encounter::   ICD-10-CM   1. Dysmenorrhea  N94.6     2. Menorrhagia with regular cycle  N92.0     3. Abnormal vaginal bleeding  N93.9 POCT hemoglobin    4.  Depression with anxiety  F41.8    Paxil 20 trial        Medications prescribed during  this encounter: Meds ordered this encounter  Medications   amLODipine (NORVASC) 5 MG tablet    Sig: Take 1 tablet (5 mg total) by mouth daily.    Dispense:  30 tablet    Refill:  11   ketorolac (TORADOL) 10 MG tablet    Sig: Take 1 tablet (10 mg total) by mouth every 8 (eight) hours as needed.    Dispense:  15 tablet    Refill:  0   PARoxetine (PAXIL) 20 MG tablet    Sig: Take 1 tablet (20 mg total) by mouth daily.    Dispense:  30 tablet    Refill:  1    Labs or Scans Ordered during this encounter: Orders Placed This Encounter  Procedures   POCT hemoglobin      Follow up Return for GYN sono, Follow up, with Dr Despina Hidden on day of sonogram.

## 2023-09-03 ENCOUNTER — Other Ambulatory Visit: Payer: Self-pay | Admitting: Obstetrics & Gynecology

## 2023-09-03 DIAGNOSIS — N920 Excessive and frequent menstruation with regular cycle: Secondary | ICD-10-CM

## 2023-09-03 DIAGNOSIS — N946 Dysmenorrhea, unspecified: Secondary | ICD-10-CM

## 2023-09-10 ENCOUNTER — Ambulatory Visit (INDEPENDENT_AMBULATORY_CARE_PROVIDER_SITE_OTHER): Payer: No Typology Code available for payment source | Admitting: Radiology

## 2023-09-10 ENCOUNTER — Ambulatory Visit (INDEPENDENT_AMBULATORY_CARE_PROVIDER_SITE_OTHER): Payer: No Typology Code available for payment source | Admitting: Obstetrics & Gynecology

## 2023-09-10 ENCOUNTER — Encounter: Payer: Self-pay | Admitting: Obstetrics & Gynecology

## 2023-09-10 VITALS — BP 150/97 | HR 85 | Ht 66.0 in | Wt 215.0 lb

## 2023-09-10 DIAGNOSIS — N920 Excessive and frequent menstruation with regular cycle: Secondary | ICD-10-CM

## 2023-09-10 DIAGNOSIS — I10 Essential (primary) hypertension: Secondary | ICD-10-CM

## 2023-09-10 DIAGNOSIS — N946 Dysmenorrhea, unspecified: Secondary | ICD-10-CM

## 2023-09-10 DIAGNOSIS — R300 Dysuria: Secondary | ICD-10-CM | POA: Diagnosis not present

## 2023-09-10 DIAGNOSIS — F418 Other specified anxiety disorders: Secondary | ICD-10-CM | POA: Diagnosis not present

## 2023-09-10 LAB — POCT URINALYSIS DIPSTICK
Glucose, UA: NEGATIVE
Ketones, UA: NEGATIVE
Nitrite, UA: NEGATIVE
Protein, UA: POSITIVE — AB

## 2023-09-10 MED ORDER — AMLODIPINE BESYLATE 10 MG PO TABS: 10.0000 mg | ORAL_TABLET | Freq: Every day | ORAL | 3 refills | Status: AC

## 2023-09-10 MED ORDER — LEVONORGESTREL 20 MCG/DAY IU IUD
1.0000 | INTRAUTERINE_SYSTEM | Freq: Once | INTRAUTERINE | Status: AC
  Administered 2023-09-10: 1 via INTRAUTERINE

## 2023-09-10 NOTE — Progress Notes (Signed)
TA and TV GYN images obtained Chaperone: [name]  Anteverted uterus normal in size with several intramural fibroids: 14 x 21 x 10 mm mid posterior fundus 25 x 24 x 16 mm mid anterior myometrium, abutts anterior endometrial wall 18 x 21 x 18 mm Right lateral fundus Endom thickness = 7.6 mm  avascular, no intracavitary focal defects seen, homogeneous Normal ovaries bilaterally - neg adnexal regions - neg CDS - no free fluid

## 2023-09-10 NOTE — Progress Notes (Signed)
Follow up appointment for results: sonogram  Chief Complaint  Patient presents with   discuss [place] results    Burning with urination; urine is "yellow"     Blood pressure (!) 149/97, pulse 81, height 5\' 6"  (1.676 m), weight 215 lb (97.5 kg), last menstrual period 07/30/2023.  US PELVIC COMPLETE WITH TRANSVAGINAL  Result Date: 09/10/2023 Images from the original result were not included.  ..an [employer] of [facility] Medicine Technical sales engineer) accredited practice [facility] [address] [place] [address] Ordering Provider: Lazaro Arms, MD                                                                                                                                   GYNECOLOGIC SONOGRAM Laura Lopez is a 37 y.o. [phone] Patient's last menstrual period was 07/30/2023. for a pelvic sonogram for menorrhagia.  Hx tubal ligation Uterus                      5.9 x 5.1 x 6.0 cm, Total uterine volume 94.01 cc     Ut length = 9.6 cm                               several intramural fibroids:                                     14 x 21 x 10 mm mid posterior fundus                                     25 x 24 x 16 mm mid anterior myometrium, abutts anterior endometrial wall                                     18 x 21 x 18 mm Right lateral fundus Endometrium          10 mm, symmetrical,  avascular cavity and canal, no intracavitary defects seen Right ovary             3.4 x 2.2 x 2.4 cm, normal Rt ov - neg adnexal region Left ovary                2.3 x .9 x 1.9 cm, normal Left ov - neg adnexal region Neg CDS  -  no free fluid present Technician Comments: TA and TV GYN images obtained Chaperone: Laura Lopez Anteverted uterus normal in size with several intramural fibroids: 14 x 21 x 10 mm mid posterior fundus 25 x 24 x 16 mm mid anterior myometrium, abutts anterior endometrial wall 18 x  21 x 18 mm Right lateral fundus Endom thickness = 7.6 mm  avascular, no intracavitary  focal defects seen, homogeneous Normal ovaries bilaterally - neg adnexal regions - neg CDS - no free fluid Laura Lopez 09/10/2023 11:34 AM  Clinical Impression and recommendations: I have reviewed the sonogram results above, combined with the patient's current clinical course, below are my impressions and any appropriate recommendations for management based on the sonographic findings. Uterus normal size shape and contour, small clinically insignificant fibroids Endometrium normal width for a menstruating woman, no abnormalities Ovaries: normal size shape and morphology Laura Lopez 09/10/2023 11:45 AM   Normal sonogram>opts for Mirena IUD insertion to manage cycles  IUD Insertion Procedure Note  Pre-operative Diagnosis: Menorrhagia and dysmenorrhea  Post-operative Diagnosis: same  Indications: contraception  Procedure Details  Urine pregnancy test was not done (BTL years ago) The risks (including infection, bleeding, pain, and uterine perforation) and benefits of the procedure were explained to the patient and Written informed consent was obtained.    Cervix cleansed with Betadine. Uterus sounded to 8 cm. IUD inserted without difficulty. String visible and trimmed. Patient tolerated procedure well.  IUD Information: Laura Lopez, Lot # [id number], Expiration date December 2026.  Condition: Stable  Complications: None  Plan:  The patient was advised to call for any fever or for prolonged or severe pain or bleeding. She was advised to use OTC analgesics as needed for mild to moderate pain.   Attending Physician Documentation: I placed the IUD  MEDS ordered this encounter: Meds ordered this encounter  Medications   amLODipine (NORVASC) 10 MG tablet    Sig: Take 1 tablet (10 mg total) by mouth daily.    Dispense:  30 tablet    Refill:  3   levonorgestrel (MIRENA) 20 MCG/DAY IUD 1 each    Orders for this encounter: Orders Placed This Encounter  Procedures   Urine Culture   POCT  Urinalysis Dipstick    Impression + Management Plan   ICD-10-CM   1. Menorrhagia with regular cycle  N92.0     2. Dysmenorrhea  N94.6     3. Burning with urination  R30.0 POCT Urinalysis Dipstick    Urine Culture    4. Depression with anxiety: pt happy with 20 mg daily  F41.8     5. Hypertension, essential, benign: ^Norvasc from 5 mg to 10 mg daily  I10       Follow Up: Return in about 6 months (around 03/10/2024) for Follow up, with Dr Laura Lopez.     All questions were answered.  Past Medical History:  Diagnosis Date   Anxiety    Chronic back pain    Depression    GERD (gastroesophageal reflux disease)    Hypertension     Past Surgical History:  Procedure Laterality Date   TONSILLECTOMY     TUBAL LIGATION     TUBAL LIGATION      OB History     Gravida  4   Para  3   Term  3   Preterm      AB  1   Living         SAB      IAB  1   Ectopic      Multiple      Live Births              No Known Allergies  Social History   Socioeconomic History   Marital status: Single    Spouse  name: Not on file   Number of children: Not on file   Years of education: Not on file   Highest education level: Not on file  Occupational History   Not on file  Tobacco Use   Smoking status: Former    Types: Cigarettes   Smokeless tobacco: Never  Vaping Use   Vaping status: Former   Substances: Nicotine, Flavoring  Substance and Sexual Activity   Alcohol use: Yes    Alcohol/week: 0.0 standard drinks of alcohol    Comment: occ   Drug use: No   Sexual activity: Yes    Birth control/protection: Surgical    Comment: tubal  Other Topics Concern   Not on file  Social History Narrative   Not on file   Social Determinants of Health   Financial Resource Strain: Low Risk  (08/13/2023)   Overall Financial Resource Strain (CARDIA)    Difficulty of Paying Living Expenses: Not very hard  Food Insecurity: No Food Insecurity (08/13/2023)   Hunger Vital Sign     Worried About Running Out of Food in the Last Year: Never true    Ran Out of Food in the Last Year: Never true  Transportation Needs: No Transportation Needs (08/13/2023)   PRAPARE - Administrator, Civil Service (Medical): No    Lack of Transportation (Non-Medical): No  Physical Activity: Inactive (08/13/2023)   Exercise Vital Sign    Days of Exercise per Week: 0 days    Minutes of Exercise per Session: 0 min  Stress: Stress Concern Present (08/13/2023)   [employer] of Occupational Health - Occupational Stress Questionnaire    Feeling of Stress : Very much  Social Connections: Socially Isolated (08/13/2023)   Social Connection and Isolation Panel [NHANES]    Frequency of Communication with Friends and Family: Twice a week    Frequency of Social Gatherings with Friends and Family: Never    Attends Religious Services: 1 to 4 times per year    Active Member of [employer] or Organizations: No    Attends Engineer, structural: Never    Marital Status: Never married    Family History  Problem Relation Age of Onset   COPD Maternal Grandmother    Emphysema Maternal Grandmother    Depression Maternal Grandmother    Cancer Mother        lung   COPD Mother    Fibroids Mother    Cirrhosis Mother

## 2023-09-13 LAB — URINE CULTURE

## 2023-09-16 ENCOUNTER — Other Ambulatory Visit: Payer: Self-pay | Admitting: Obstetrics & Gynecology

## 2023-09-16 MED ORDER — SULFAMETHOXAZOLE-TRIMETHOPRIM 800-160 MG PO TABS: 1.0000 | ORAL_TABLET | Freq: Two times a day (BID) | ORAL | 0 refills | Status: DC

## 2023-09-17 ENCOUNTER — Encounter: Payer: Self-pay | Admitting: Obstetrics & Gynecology

## 2024-03-04 ENCOUNTER — Encounter: Payer: Self-pay | Admitting: *Deleted

## 2024-03-05 ENCOUNTER — Ambulatory Visit: Admission: EM | Admit: 2024-03-05 | Discharge: 2024-03-05 | Disposition: A | Discharge status: Home / Self Care

## 2024-03-05 DIAGNOSIS — J069 Acute upper respiratory infection, unspecified: Secondary | ICD-10-CM | POA: Diagnosis not present

## 2024-03-05 LAB — POC COVID19/FLU A&B COMBO
Covid Antigen, POC: NEGATIVE
Influenza A Antigen, POC: NEGATIVE
Influenza B Antigen, POC: NEGATIVE

## 2024-03-05 MED ORDER — FLUTICASONE PROPIONATE 50 MCG/ACT NA SUSP: 1.0000 | Freq: Every day | NASAL | 0 refills | Status: AC

## 2024-03-05 MED ORDER — PROMETHAZINE-DM 6.25-15 MG/5ML PO SYRP: 5.0000 mL | ORAL_SOLUTION | Freq: Four times a day (QID) | ORAL | 0 refills | Status: AC | PRN

## 2024-03-05 MED ORDER — ACETAMINOPHEN 325 MG PO TABS
975.0000 mg | ORAL_TABLET | Freq: Once | ORAL | Status: AC
  Administered 2024-03-05: 975 mg via ORAL

## 2024-03-05 NOTE — ED Triage Notes (Signed)
 Pt reports she has a cough, head pressure, nasal congestion, feels weak, diarrhea, and chest congestion.    Exposed to COVID test was neg at work this morning

## 2024-03-05 NOTE — ED Provider Notes (Signed)
 [facility]    CSN: [id number] Arrival date & time: 03/05/24  1036      History   Chief Complaint No chief complaint on file.   HPI Laura Lopez is a 38 y.o. female.   Patient presents today with a 12-hour history of URI symptoms.  She reports that last night she felt very tired but she attributed this to working 12-hour shift at her place of employment.  Overnight she developed cough, congestion, body aches.  She has not had any fever and denies any chest pain, shortness of breath, nausea, vomiting.  She does work around many people and one of them is recently tested positive for COVID.  She took a COVID test at work this morning that was negative.  Her last episode of COVID was several years ago and she has had the vaccines.  She denies any history of allergies, asthma, smoking, diabetes.  She is confident that she is not pregnant.  Denies any recent antibiotics or steroids.    Past Medical History:  Diagnosis Date   Abnormal Pap smear of cervix    Anxiety    Chronic back pain    Depression    GERD (gastroesophageal reflux disease)    Hypertension     Patient Active Problem List   Diagnosis Date Noted   Major depressive disorder, recurrent episode, moderate (HCC) 01/31/2013    Class: Acute    Past Surgical History:  Procedure Laterality Date   TONSILLECTOMY     TUBAL LIGATION     TUBAL LIGATION      OB History     Gravida  4   Para  3   Term  3   Preterm      AB  1   Living         SAB      IAB  1   Ectopic      Multiple      Live Births               Home Medications    Prior to Admission medications   Medication Sig Start Date End Date Taking? Authorizing Provider  fluticasone (FLONASE) 50 MCG/ACT nasal spray Place 1 spray into both nostrils daily. 03/05/24  Yes Bascom Biel K, PA-C  levonorgestrel  (MIRENA ) 20 MCG/DAY IUD 1 each by Intrauterine route once.   Yes [provider]   promethazine -dextromethorphan (PROMETHAZINE -DM) 6.25-15 MG/5ML syrup Take 5 mLs by mouth 4 (four) times daily as needed for cough. 03/05/24  Yes Waller Marcussen K, PA-C  amLODipine  (NORVASC ) 10 MG tablet Take 1 tablet (10 mg total) by mouth daily. 09/10/23   Wendelyn Halter, MD  ketorolac  (TORADOL ) 10 MG tablet Take 1 tablet (10 mg total) by mouth every 8 (eight) hours as needed. 08/13/23   Wendelyn Halter, MD  pantoprazole  (PROTONIX ) 40 MG tablet Take 40 mg by mouth daily. Patient not taking: Reported on 08/13/2023    [provider]  PARoxetine  (PAXIL ) 20 MG tablet Take 1 tablet (20 mg total) by mouth daily. 08/13/23   Wendelyn Halter, MD  promethazine  (PHENERGAN ) 12.5 MG tablet Take 1 tablet (12.5 mg total) by mouth every 6 (six) hours as needed. Patient not taking: Reported on 10/29/2019 11/05/18 05/03/20  Venson Ginger, PA-C    Family History Family History  Problem Relation Age of Onset   COPD Maternal Grandmother    Emphysema Maternal Grandmother    Depression Maternal Grandmother    Cancer Mother  lung   COPD Mother    Fibroids Mother    Cirrhosis Mother     Social History Social History   Tobacco Use   Smoking status: Former    Types: Cigarettes   Smokeless tobacco: Never  Vaping Use   Vaping status: Former   Substances: Nicotine, Flavoring  Substance Use Topics   Alcohol use: Yes    Alcohol/week: 0.0 standard drinks of alcohol    Comment: occ   Drug use: No     Allergies   Patient has no known allergies.   Review of Systems Review of Systems  Constitutional:  Positive for activity change and fatigue. Negative for appetite change and fever.  HENT:  Positive for congestion, postnasal drip, sinus pressure and sore throat. Negative for sneezing.   Respiratory:  Positive for cough. Negative for shortness of breath.   Cardiovascular:  Negative for chest pain.  Gastrointestinal:  Negative for abdominal pain, diarrhea, nausea and vomiting.  Musculoskeletal:   Positive for arthralgias and myalgias.  Neurological:  Positive for headaches. Negative for dizziness and light-headedness.     Physical Exam Triage Vital Signs ED Triage Vitals  Encounter Vitals Group     BP 03/05/24 1040 (!) 135/102     Systolic BP Percentile --      Diastolic BP Percentile --      Pulse Rate 03/05/24 1040 80     Resp 03/05/24 1040 18     Temp 03/05/24 1040 98 F (36.7 C)     Temp Source 03/05/24 1040 Oral     SpO2 03/05/24 1040 98 %     Weight --      Height --      Head Circumference --      Peak Flow --      Pain Score 03/05/24 1042 6     Pain Loc --      Pain Education --      Exclude from Growth Chart --    No data found.  Updated Vital Signs BP (!) 135/102 (BP Location: Right Arm)   Pulse 80   Temp 98 F (36.7 C) (Oral)   Resp 18   LMP 01/16/2024   SpO2 98%   Visual Acuity Right Eye Distance:   Left Eye Distance:   Bilateral Distance:    Right Eye Near:   Left Eye Near:    Bilateral Near:     Physical Exam Vitals reviewed.  Constitutional:      General: She is awake. She is not in acute distress.    Appearance: Normal appearance. She is well-developed. She is not ill-appearing.     Comments: Very pleasant female appears stated age in no acute distress sitting comfortably in exam room  HENT:     Head: Normocephalic and atraumatic.     Right Ear: External ear normal. There is impacted cerumen.     Left Ear: Tympanic membrane, ear canal and external ear normal. Tympanic membrane is not erythematous or bulging.     Nose:     Right Sinus: No maxillary sinus tenderness or frontal sinus tenderness.     Left Sinus: No maxillary sinus tenderness or frontal sinus tenderness.     Mouth/Throat:     Pharynx: Uvula midline. Posterior oropharyngeal erythema and postnasal drip present. No oropharyngeal exudate.  Cardiovascular:     Rate and Rhythm: Normal rate and regular rhythm.     Heart sounds: Normal heart sounds, S1 normal and S2 normal.  No murmur heard.  Pulmonary:     Effort: Pulmonary effort is normal.     Breath sounds: Normal breath sounds. No wheezing, rhonchi or rales.     Comments: Clear to auscultation bilaterally Psychiatric:        Behavior: Behavior is cooperative.      UC Treatments / Results  Labs (all labs ordered are listed, but only abnormal results are displayed) Labs Reviewed  POC COVID19/FLU A&B COMBO    EKG   Radiology No results found.  Procedures Procedures (including critical care time)  Medications Ordered in UC Medications  acetaminophen  (TYLENOL ) tablet 975 mg (975 mg Oral Given 03/05/24 1052)    Initial Impression / Assessment and Plan / UC Course  I have reviewed the triage vital signs and the nursing notes.  Pertinent labs & imaging results that were available during my care of the patient were reviewed by me and considered in my medical decision making (see chart for details).     Patient is well-appearing, afebrile, nontoxic, nontachycardic.  No evidence of acute infection on physical exam that warrant initiation of antibiotics.  COVID and flu testing was obtained and was negative.  We discussed that is possible this is a false negative given she is only been symptomatic for a few hours but that likely she has a virus and given she is young and otherwise healthy we will not pursue PCR viral testing.  Will treat symptomatically and she was encouraged use over-the-counter medication such as Mucinex, Tylenol , ibuprofen , nasal saline/sinus rinses.  She was given fluticasone nasal spray to help with congestion as well as Promethazine  DM for cough.  We discussed that Promethazine  DM can be sedating and she is not to drive or drink alcohol with taking it.  If she has any worsening or changing symptoms she needs to be seen immediately including high fever, worsening cough, shortness of breath, chest pain, nausea/vomiting interfere with oral intake.  Strict return precautions given.  Excuse  note provided.  Final Clinical Impressions(s) / UC Diagnoses   Final diagnoses:  Viral URI with cough     Discharge Instructions      You tested negative for COVID and flu in clinic.  I suspect you have a different virus that is causing your symptoms.  I do not see any infection on exam that we should start an antibiotic to treat.  We are going to treat your symptoms.  Use Mucinex, Tylenol , ibuprofen , nasal saline/sinus rinses for symptom relief.  I have called in Flonase to help with the congestion.  You can take Promethazine  DM for cough but this will make you sleepy so do not drive or drink alcohol with taking it.  Make sure you rest and drink plenty of fluid.  If you are not feeling better in a week please return for reevaluation.  If anything worsens and you have high fever, worsening cough, shortness of breath, chest pain, nausea/vomiting you need to be seen immediately.   ED Prescriptions     Medication Sig Dispense Auth. Provider   promethazine -dextromethorphan (PROMETHAZINE -DM) 6.25-15 MG/5ML syrup Take 5 mLs by mouth 4 (four) times daily as needed for cough. 118 mL Kaelea Gathright K, PA-C   fluticasone (FLONASE) 50 MCG/ACT nasal spray Place 1 spray into both nostrils daily. 16 g Arlow Spiers K, PA-C      PDMP not reviewed this encounter.   Budd Cargo, PA-C 03/05/24 1117

## 2024-03-05 NOTE — Discharge Instructions (Signed)
 You tested negative for COVID and flu in clinic.  I suspect you have a different virus that is causing your symptoms.  I do not see any infection on exam that we should start an antibiotic to treat.  We are going to treat your symptoms.  Use Mucinex, Tylenol , ibuprofen , nasal saline/sinus rinses for symptom relief.  I have called in Flonase to help with the congestion.  You can take Promethazine  DM for cough but this will make you sleepy so do not drive or drink alcohol with taking it.  Make sure you rest and drink plenty of fluid.  If you are not feeling better in a week please return for reevaluation.  If anything worsens and you have high fever, worsening cough, shortness of breath, chest pain, nausea/vomiting you need to be seen immediately.

## 2024-06-25 ENCOUNTER — Ambulatory Visit: Admitting: Adult Health

## 2024-09-03 ENCOUNTER — Ambulatory Visit
Admission: EM | Admit: 2024-09-03 | Discharge: 2024-09-03 | Disposition: A | Discharge status: Home / Self Care | Attending: Family Medicine | Admitting: Family Medicine

## 2024-09-03 DIAGNOSIS — Z113 Encounter for screening for infections with a predominantly sexual mode of transmission: Secondary | ICD-10-CM | POA: Insufficient documentation

## 2024-09-03 DIAGNOSIS — N390 Urinary tract infection, site not specified: Secondary | ICD-10-CM | POA: Diagnosis present

## 2024-09-03 DIAGNOSIS — N760 Acute vaginitis: Secondary | ICD-10-CM | POA: Insufficient documentation

## 2024-09-03 LAB — POCT URINE DIPSTICK
Bilirubin, UA: NEGATIVE
Glucose, UA: NEGATIVE mg/dL
Ketones, POC UA: NEGATIVE mg/dL
Nitrite, UA: NEGATIVE
POC PROTEIN,UA: 30 — AB
Spec Grav, UA: 1.02 (ref 1.010–1.025)
Urobilinogen, UA: 0.2 U/dL
pH, UA: 7 (ref 5.0–8.0)

## 2024-09-03 MED ORDER — PHENAZOPYRIDINE HCL 200 MG PO TABS: 200.0000 mg | ORAL_TABLET | Freq: Three times a day (TID) | ORAL | 0 refills | Status: AC | PRN

## 2024-09-03 MED ORDER — CEPHALEXIN 500 MG PO CAPS: 500.0000 mg | ORAL_CAPSULE | Freq: Two times a day (BID) | ORAL | 0 refills | Status: AC

## 2024-09-03 NOTE — ED Triage Notes (Signed)
 Pt reports lower abdominal pressure and lower back pain burning with urination and not being able to empty bladder fully, itching on the outer surface of the vagina. x 3 days.   Pt has also has requested STD testing stating her significant other has stepped outside of the relationship. Pt is wanting both swab and blood work.

## 2024-09-03 NOTE — Discharge Instructions (Signed)
 We are treating you for urinary tract infection with an antibiotic and an as needed medication for burning.  I have sent out a urine culture and we will let you know if we need to make any changes to your regimen based on this.  We have also sent out blood work and a vaginal swab for further evaluation and we will let you know if anything comes back abnormal on these.

## 2024-09-03 NOTE — ED Provider Notes (Signed)
 [facility]    CSN: [id number] Arrival date & time: 09/03/24  9075      History   Chief Complaint No chief complaint on file.   HPI Laura Lopez is a 38 y.o. female.   Patient presenting today with 3-day history of lower abdominal pressure, low back aching, dysuria, urinary hesitancy and urgency.  She is also having some external itching to the vaginal region.  She denies fever, chills, nausea, vomiting, rashes or lesions, abnormal discharge.  So far not trying anything over-the-counter for symptoms.  She is also requesting a full panel of STD screening as her significant other cheated on her.    Past Medical History:  Diagnosis Date   Abnormal Pap smear of cervix    Anxiety    Chronic back pain    Depression    GERD (gastroesophageal reflux disease)    Hypertension     Patient Active Problem List   Diagnosis Date Noted   Major depressive disorder, recurrent episode, moderate (HCC) 01/31/2013    Class: Acute    Past Surgical History:  Procedure Laterality Date   TONSILLECTOMY     TUBAL LIGATION     TUBAL LIGATION      OB History     Gravida  4   Para  3   Term  3   Preterm      AB  1   Living         SAB      IAB  1   Ectopic      Multiple      Live Births               Home Medications    Prior to Admission medications   Medication Sig Start Date End Date Taking? Authorizing Provider  cephALEXin  (KEFLEX ) 500 MG capsule Take 1 capsule (500 mg total) by mouth 2 (two) times daily. 09/03/24  Yes Stuart Vernell Norris, PA-C  phenazopyridine  (PYRIDIUM ) 200 MG tablet Take 1 tablet (200 mg total) by mouth 3 (three) times daily as needed for pain. 09/03/24  Yes Stuart Vernell Norris, PA-C  amLODipine  (NORVASC ) 10 MG tablet Take 1 tablet (10 mg total) by mouth daily. 09/10/23   Jayne Vonn DEL, MD  fluticasone  (FLONASE ) 50 MCG/ACT nasal spray Place 1 spray into both nostrils daily. 03/05/24   Raspet, Erin K, PA-C  ketorolac   (TORADOL ) 10 MG tablet Take 1 tablet (10 mg total) by mouth every 8 (eight) hours as needed. 08/13/23   Jayne Vonn DEL, MD  levonorgestrel  (MIRENA ) 20 MCG/DAY IUD 1 each by Intrauterine route once.    [provider]  pantoprazole  (PROTONIX ) 40 MG tablet Take 40 mg by mouth daily. Patient not taking: Reported on 08/13/2023    [provider]  PARoxetine  (PAXIL ) 20 MG tablet Take 1 tablet (20 mg total) by mouth daily. 08/13/23   Jayne Vonn DEL, MD  promethazine -dextromethorphan (PROMETHAZINE -DM) 6.25-15 MG/5ML syrup Take 5 mLs by mouth 4 (four) times daily as needed for cough. 03/05/24   Raspet, Erin K, PA-C  promethazine  (PHENERGAN ) 12.5 MG tablet Take 1 tablet (12.5 mg total) by mouth every 6 (six) hours as needed. Patient not taking: Reported on 10/29/2019 11/05/18 05/03/20  Armida Culver, PA-C    Family History Family History  Problem Relation Age of Onset   COPD Maternal Grandmother    Emphysema Maternal Grandmother    Depression Maternal Grandmother    Cancer Mother  lung   COPD Mother    Fibroids Mother    Cirrhosis Mother     Social History Social History   Tobacco Use   Smoking status: Former    Types: Cigarettes   Smokeless tobacco: Never  Vaping Use   Vaping status: Former   Substances: Nicotine, Flavoring  Substance Use Topics   Alcohol use: Yes    Alcohol/week: 0.0 standard drinks of alcohol    Comment: occ   Drug use: No     Allergies   Patient has no known allergies.   Review of Systems Review of Systems Per HPI  Physical Exam Triage Vital Signs ED Triage Vitals  Encounter Vitals Group     BP 09/03/24 0939 (!) 158/100     Girls Systolic BP Percentile --      Girls Diastolic BP Percentile --      Boys Systolic BP Percentile --      Boys Diastolic BP Percentile --      Pulse Rate 09/03/24 0939 94     Resp 09/03/24 0939 20     Temp 09/03/24 0939 98.1 F (36.7 C)     Temp Source 09/03/24 0939 Oral     SpO2 09/03/24 0939 97 %      Weight --      Height --      Head Circumference --      Peak Flow --      Pain Score 09/03/24 0943 9     Pain Loc --      Pain Education --      Exclude from Growth Chart --    No data found.  Updated Vital Signs BP (!) 145/96 (BP Location: Right Arm)   Pulse 91   Temp 98.1 F (36.7 C) (Oral)   Resp 20   SpO2 97%   Visual Acuity Right Eye Distance:   Left Eye Distance:   Bilateral Distance:    Right Eye Near:   Left Eye Near:    Bilateral Near:     Physical Exam Vitals and nursing note reviewed.  Constitutional:      Appearance: Normal appearance. She is not ill-appearing.  HENT:     Head: Atraumatic.  Eyes:     Extraocular Movements: Extraocular movements intact.     Conjunctiva/sclera: Conjunctivae normal.  Cardiovascular:     Rate and Rhythm: Normal rate.  Pulmonary:     Effort: Pulmonary effort is normal.  Abdominal:     General: Bowel sounds are normal. There is no distension.     Palpations: Abdomen is soft.     Tenderness: There is no abdominal tenderness. There is no right CVA tenderness, left CVA tenderness or guarding.  Genitourinary:    Comments: GU exam deferred, self swab performed Musculoskeletal:        General: Normal range of motion.     Cervical back: Normal range of motion and neck supple.  Skin:    General: Skin is warm and dry.  Neurological:     Mental Status: She is alert and oriented to person, place, and time.  Psychiatric:        Mood and Affect: Mood normal.        Thought Content: Thought content normal.        Judgment: Judgment normal.      UC Treatments / Results  Labs (all labs ordered are listed, but only abnormal results are displayed) Labs Reviewed  POCT URINE DIPSTICK - Abnormal; Notable for the  following components:      Result Value   Clarity, UA cloudy (*)    Blood, UA trace-intact (*)    POC PROTEIN,UA =30 (*)    Leukocytes, UA Moderate (2+) (*)    All other components within normal limits  URINE  CULTURE  SYPHILIS: RPR W/REFLEX TO RPR TITER AND TREPONEMAL ANTIBODIES, TRADITIONAL SCREENING AND DIAGNOSIS ALGORITHM  HIV ANTIBODY (ROUTINE TESTING W REFLEX)  CERVICOVAGINAL ANCILLARY ONLY    EKG   Radiology No results found.  Procedures Procedures (including critical care time)  Medications Ordered in UC Medications - No data to display  Initial Impression / Assessment and Plan / UC Course  I have reviewed the triage vital signs and the nursing notes.  Pertinent labs & imaging results that were available during my care of the patient were reviewed by me and considered in my medical decision making (see chart for details).     Minimally hypertensive in triage, otherwise vital signs reassuring.  She is well-appearing in no acute distress.  Urinalysis today with evidence of urinary tract infection.  Urine culture pending, treat with Keflex , Pyridium , fluids.  STD labs and vaginal swab pending additionally.  Treat based on these results.  Return for worsening symptoms.  Final Clinical Impressions(s) / UC Diagnoses   Final diagnoses:  Screening examination for STI  Acute lower UTI  Acute vaginitis     Discharge Instructions      We are treating you for urinary tract infection with an antibiotic and an as needed medication for burning.  I have sent out a urine culture and we will let you know if we need to make any changes to your regimen based on this.  We have also sent out blood work and a vaginal swab for further evaluation and we will let you know if anything comes back abnormal on these.    ED Prescriptions     Medication Sig Dispense Auth. Provider   cephALEXin  (KEFLEX ) 500 MG capsule Take 1 capsule (500 mg total) by mouth 2 (two) times daily. 10 capsule Stuart Vernell Norris, PA-C   phenazopyridine  (PYRIDIUM ) 200 MG tablet Take 1 tablet (200 mg total) by mouth 3 (three) times daily as needed for pain. 6 tablet Stuart Vernell Norris, [place]      PDMP not reviewed  this encounter.   Stuart Vernell Norris, PA-C 09/03/24 1157

## 2024-09-04 LAB — CERVICOVAGINAL ANCILLARY ONLY
Bacterial Vaginitis (gardnerella): POSITIVE — AB
Candida Glabrata: NEGATIVE
Candida Vaginitis: NEGATIVE
Chlamydia: NEGATIVE
Comment: NEGATIVE
Comment: NEGATIVE
Comment: NEGATIVE
Comment: NEGATIVE
Comment: NEGATIVE
Comment: NORMAL
Neisseria Gonorrhea: NEGATIVE
Trichomonas: NEGATIVE

## 2024-09-04 LAB — HIV ANTIBODY (ROUTINE TESTING W REFLEX): HIV Screen 4th Generation wRfx: NONREACTIVE

## 2024-09-04 LAB — SYPHILIS: RPR W/REFLEX TO RPR TITER AND TREPONEMAL ANTIBODIES, TRADITIONAL SCREENING AND DIAGNOSIS ALGORITHM: RPR Ser Ql: NONREACTIVE

## 2024-09-05 ENCOUNTER — Ambulatory Visit (HOSPITAL_COMMUNITY): Payer: Self-pay

## 2024-09-05 LAB — URINE CULTURE: Culture: >=100000 — AB

## 2024-09-05 MED ORDER — METRONIDAZOLE 500 MG PO TABS: 500.0000 mg | ORAL_TABLET | Freq: Two times a day (BID) | ORAL | 0 refills | Status: AC

## 2024-09-29 ENCOUNTER — Ambulatory Visit: Admission: EM | Admit: 2024-09-29 | Discharge: 2024-09-29 | Discharge status: Against Medical Advice
